# Patient Record
Sex: Female | Born: 1955 | Race: White | Hispanic: No | Marital: Married | Smoking: Never smoker
Health system: Southern US, Community
[De-identification: ages and names within clinical notes are randomized; demographics above are authoritative.]

## PROBLEM LIST (undated history)

## (undated) DIAGNOSIS — M549 Dorsalgia, unspecified: Secondary | ICD-10-CM

## (undated) DIAGNOSIS — F419 Anxiety disorder, unspecified: Secondary | ICD-10-CM

## (undated) DIAGNOSIS — R319 Hematuria, unspecified: Secondary | ICD-10-CM

## (undated) DIAGNOSIS — F329 Major depressive disorder, single episode, unspecified: Secondary | ICD-10-CM

## (undated) DIAGNOSIS — T7840XA Allergy, unspecified, initial encounter: Secondary | ICD-10-CM

## (undated) DIAGNOSIS — K5790 Diverticulosis of intestine, part unspecified, without perforation or abscess without bleeding: Secondary | ICD-10-CM

## (undated) DIAGNOSIS — M1990 Unspecified osteoarthritis, unspecified site: Secondary | ICD-10-CM

## (undated) DIAGNOSIS — D649 Anemia, unspecified: Secondary | ICD-10-CM

## (undated) DIAGNOSIS — Z86010 Personal history of colonic polyps: Secondary | ICD-10-CM

## (undated) DIAGNOSIS — E785 Hyperlipidemia, unspecified: Secondary | ICD-10-CM

## (undated) DIAGNOSIS — F32A Depression, unspecified: Secondary | ICD-10-CM

## (undated) DIAGNOSIS — R42 Dizziness and giddiness: Secondary | ICD-10-CM

## (undated) DIAGNOSIS — I10 Essential (primary) hypertension: Secondary | ICD-10-CM

## (undated) DIAGNOSIS — K219 Gastro-esophageal reflux disease without esophagitis: Secondary | ICD-10-CM

## (undated) DIAGNOSIS — E739 Lactose intolerance, unspecified: Secondary | ICD-10-CM

## (undated) DIAGNOSIS — M25839 Other specified joint disorders, unspecified wrist: Secondary | ICD-10-CM

## (undated) DIAGNOSIS — G709 Myoneural disorder, unspecified: Secondary | ICD-10-CM

## (undated) DIAGNOSIS — D3500 Benign neoplasm of unspecified adrenal gland: Secondary | ICD-10-CM

## (undated) DIAGNOSIS — G2581 Restless legs syndrome: Secondary | ICD-10-CM

## (undated) HISTORY — DX: Gastro-esophageal reflux disease without esophagitis: K21.9

## (undated) HISTORY — DX: Myoneural disorder, unspecified: G70.9

## (undated) HISTORY — DX: Dizziness and giddiness: R42

## (undated) HISTORY — DX: Dorsalgia, unspecified: M54.9

## (undated) HISTORY — DX: Anxiety disorder, unspecified: F41.9

## (undated) HISTORY — DX: Other specified joint disorders, unspecified wrist: M25.839

## (undated) HISTORY — DX: Restless legs syndrome: G25.81

## (undated) HISTORY — DX: Hyperlipidemia, unspecified: E78.5

## (undated) HISTORY — DX: Depression, unspecified: F32.A

## (undated) HISTORY — DX: Personal history of colonic polyps: Z86.010

## (undated) HISTORY — DX: Allergy, unspecified, initial encounter: T78.40XA

## (undated) HISTORY — DX: Diverticulosis of intestine, part unspecified, without perforation or abscess without bleeding: K57.90

## (undated) HISTORY — DX: Unspecified osteoarthritis, unspecified site: M19.90

## (undated) HISTORY — PX: OTHER SURGICAL HISTORY: SHX169

## (undated) HISTORY — DX: Benign neoplasm of unspecified adrenal gland: D35.00

## (undated) HISTORY — DX: Major depressive disorder, single episode, unspecified: F32.9

## (undated) HISTORY — DX: Lactose intolerance, unspecified: E73.9

## (undated) HISTORY — PX: NASAL SINUS SURGERY: SHX719

## (undated) HISTORY — DX: Essential (primary) hypertension: I10

## (undated) HISTORY — DX: Hematuria, unspecified: R31.9

## (undated) HISTORY — DX: Anemia, unspecified: D64.9

---

## 2000-01-03 ENCOUNTER — Other Ambulatory Visit: Admission: RE | Admit: 2000-01-03 | Discharge: 2000-01-03 | Payer: Self-pay | Admitting: Internal Medicine

## 2001-01-11 ENCOUNTER — Ambulatory Visit (HOSPITAL_COMMUNITY): Admission: RE | Admit: 2001-01-11 | Discharge: 2001-01-11 | Payer: Self-pay | Admitting: Internal Medicine

## 2003-02-10 ENCOUNTER — Encounter: Admission: RE | Admit: 2003-02-10 | Discharge: 2003-02-10 | Payer: Self-pay | Admitting: Family Medicine

## 2003-08-28 ENCOUNTER — Emergency Department (HOSPITAL_COMMUNITY): Admission: EM | Admit: 2003-08-28 | Discharge: 2003-08-28 | Payer: Self-pay | Admitting: Family Medicine

## 2004-03-22 ENCOUNTER — Ambulatory Visit (HOSPITAL_COMMUNITY): Admission: RE | Admit: 2004-03-22 | Discharge: 2004-03-22 | Payer: Self-pay | Admitting: *Deleted

## 2004-03-22 ENCOUNTER — Encounter (INDEPENDENT_AMBULATORY_CARE_PROVIDER_SITE_OTHER): Payer: Self-pay | Admitting: *Deleted

## 2004-06-01 ENCOUNTER — Ambulatory Visit: Payer: Self-pay | Admitting: Family Medicine

## 2004-10-18 IMAGING — CT CT HEAD WO/W CM
1 of 2 series · 13 of 30 positions shown, 17 images · IV contrast (100 ML OMNI 300)
Comparison: none

CLINICAL DATA: Headaches.  Hypertension.  Dizziness.  CON-NONE.
 HEAD CT PRE AND POST CONTRAST
 Cranial CT was performed before and after administration of 100 cc Omnipaque 300 intravenous contrast.
 There is no evidence of enhancing lesions, brain edema, mass effect or intracranial hemorrhage. The ventricles are normal. No extra-axial abnormalities are identified. Bone windows show no significant abnormality.
 IMPRESSION
 Negative cranial CT.

[Series 2: brain · axial · 0.49mm/px · z∈[+30,+154]mm · 13 of 28 slices shown, 17 images]
[im 2/28  brain]
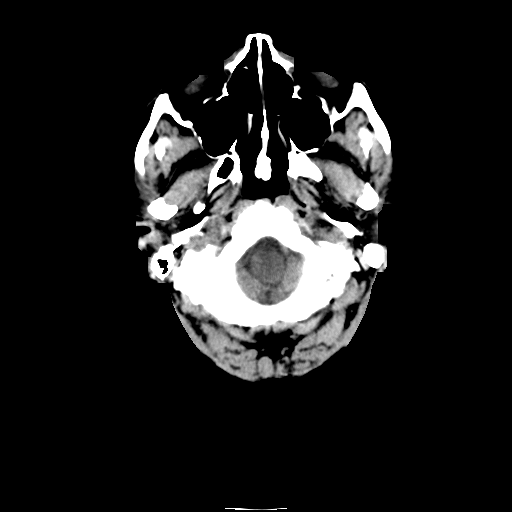
[im 2/28  bone]
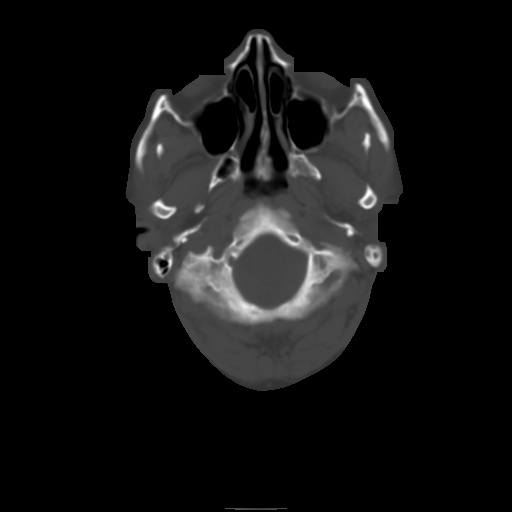
[im 4/28  brain]
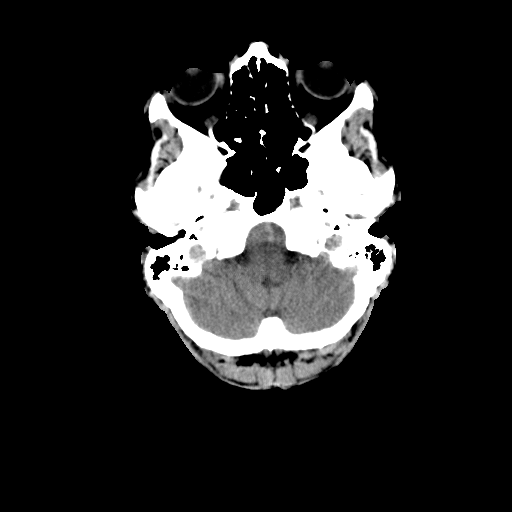
[im 6/28  brain]
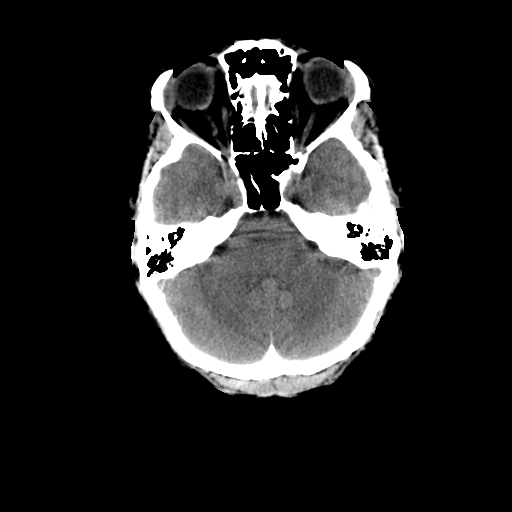
[im 8/28  brain]
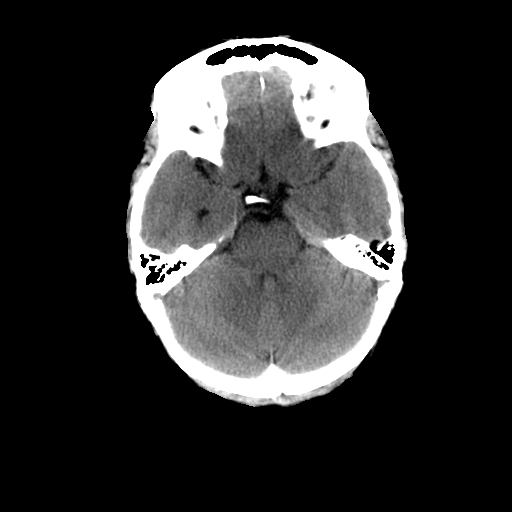
[im 10/28  brain]
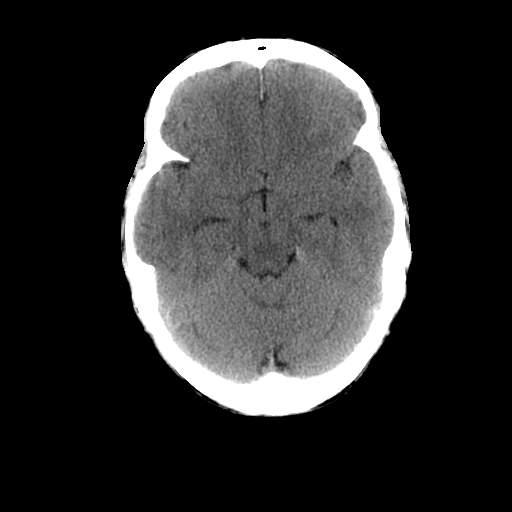
[im 10/28  bone]
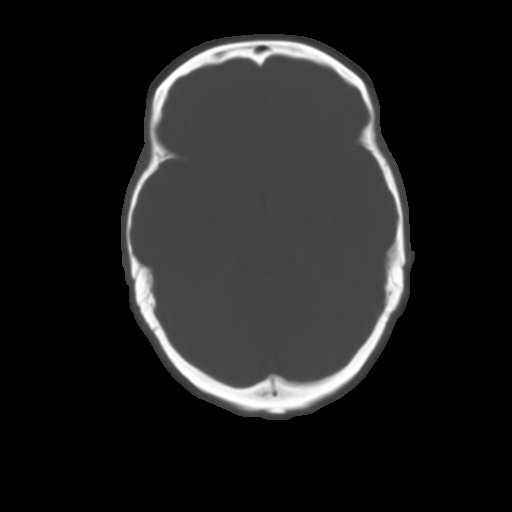
[im 12/28  brain]
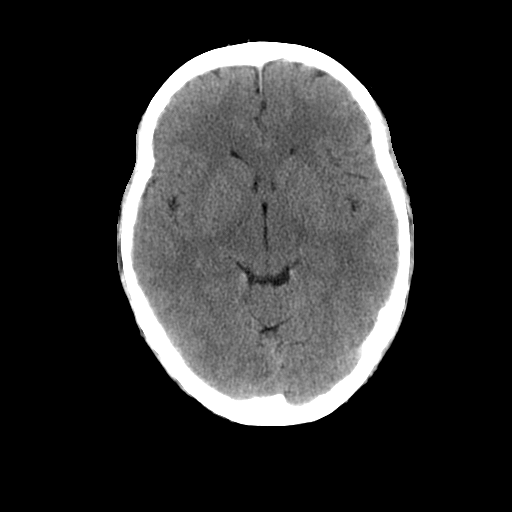
[im 14/28  brain]
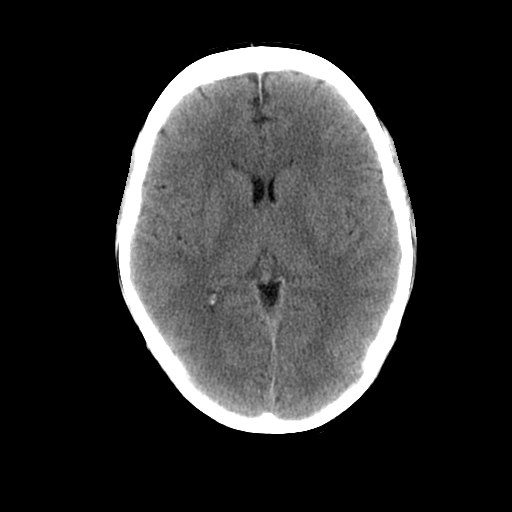
[im 16/28  brain]
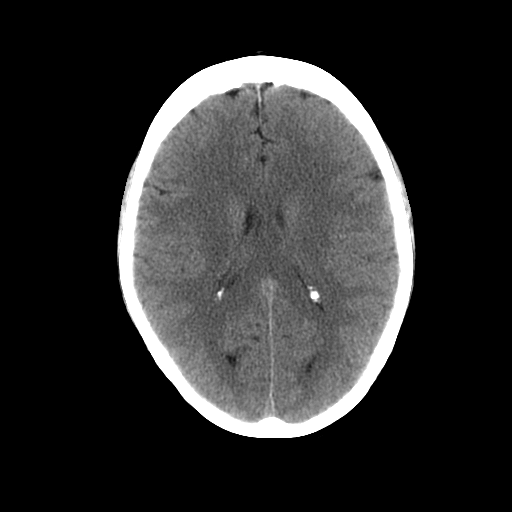
[im 18/28  brain]
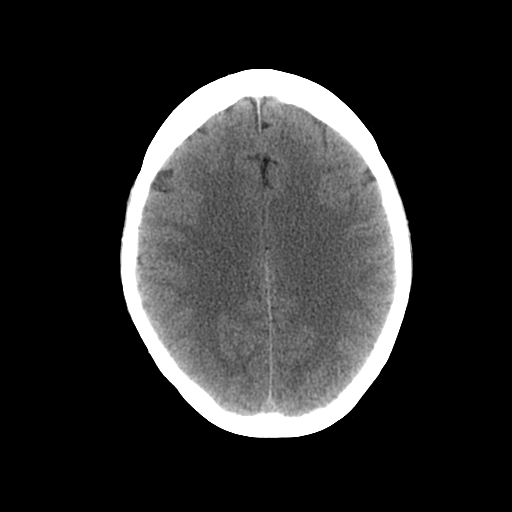
[im 18/28  bone]
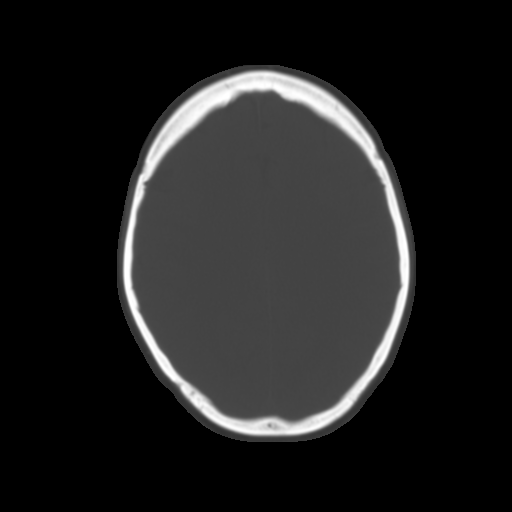
[im 20/28  brain]
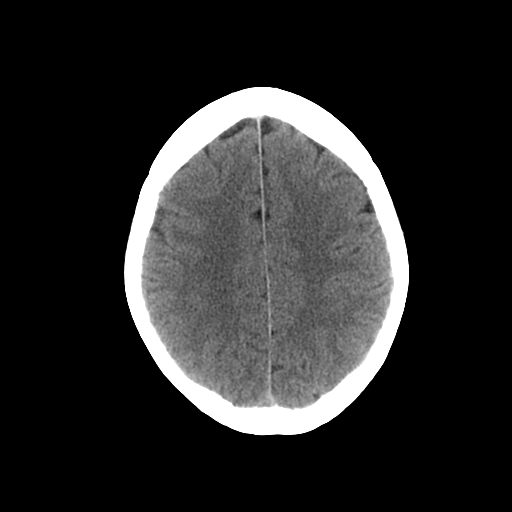
[im 22/28  brain]
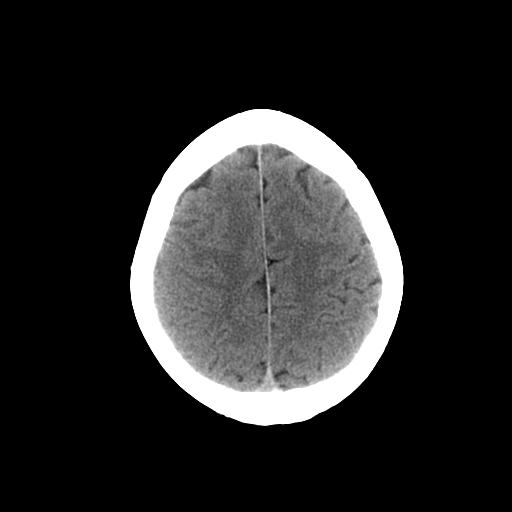
[im 24/28  brain]
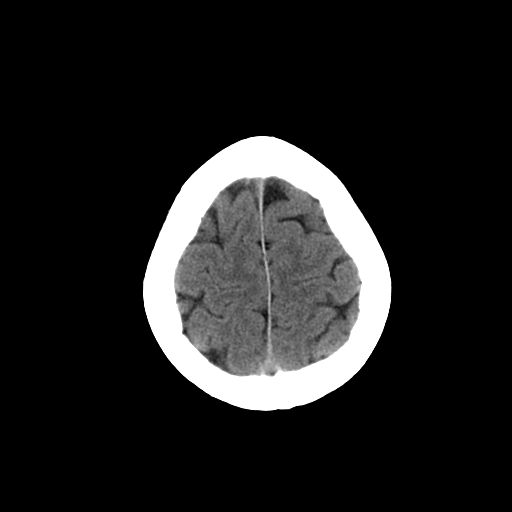
[im 26/28  brain]
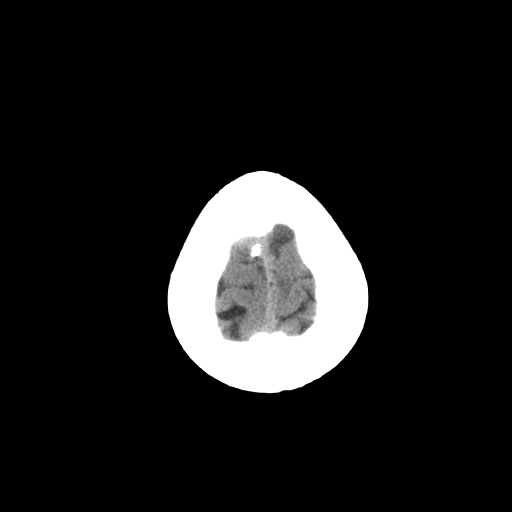
[im 26/28  bone]
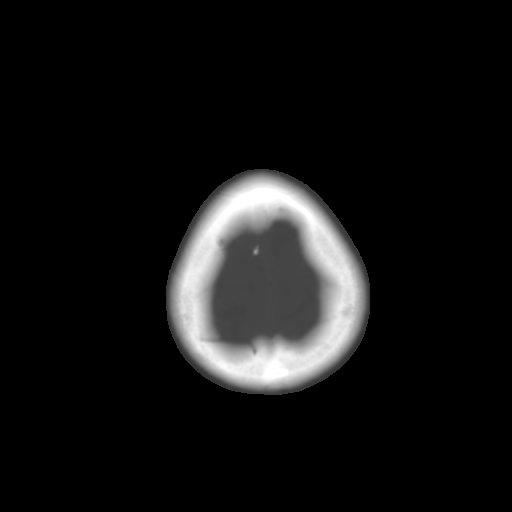

[13 of 30 positions shown; findings below may reference images not displayed]

## 2004-10-21 ENCOUNTER — Ambulatory Visit: Payer: Self-pay | Admitting: Family Medicine

## 2005-01-03 ENCOUNTER — Emergency Department (HOSPITAL_COMMUNITY): Admission: EM | Admit: 2005-01-03 | Discharge: 2005-01-03 | Payer: Self-pay | Admitting: Family Medicine

## 2005-01-04 ENCOUNTER — Ambulatory Visit: Payer: Self-pay | Admitting: Family Medicine

## 2005-02-13 ENCOUNTER — Ambulatory Visit: Payer: Self-pay | Admitting: Licensed Clinical Social Worker

## 2005-03-23 ENCOUNTER — Ambulatory Visit: Payer: Self-pay | Admitting: Family Medicine

## 2005-05-30 ENCOUNTER — Ambulatory Visit: Payer: Self-pay | Admitting: Family Medicine

## 2005-06-06 ENCOUNTER — Ambulatory Visit: Payer: Self-pay | Admitting: Family Medicine

## 2005-06-26 ENCOUNTER — Ambulatory Visit: Payer: Self-pay | Admitting: Family Medicine

## 2005-07-17 ENCOUNTER — Ambulatory Visit: Payer: Self-pay | Admitting: Family Medicine

## 2006-06-22 ENCOUNTER — Ambulatory Visit: Payer: Self-pay | Admitting: Family Medicine

## 2006-07-13 ENCOUNTER — Ambulatory Visit: Payer: Self-pay | Admitting: Family Medicine

## 2006-07-13 LAB — CONVERTED CEMR LAB
ALT: 17 units/L (ref 0–40)
AST: 16 units/L (ref 0–37)
Albumin: 3.6 g/dL (ref 3.5–5.2)
Alkaline Phosphatase: 48 units/L (ref 39–117)
BUN: 13 mg/dL (ref 6–23)
Basophils Absolute: 0 10*3/uL (ref 0.0–0.1)
Basophils Relative: 0.5 % (ref 0.0–1.0)
Bilirubin, Direct: 0.1 mg/dL (ref 0.0–0.3)
CO2: 32 meq/L (ref 19–32)
Calcium: 9.4 mg/dL (ref 8.4–10.5)
Chloride: 107 meq/L (ref 96–112)
Cholesterol: 177 mg/dL (ref 0–200)
Creatinine, Ser: 0.9 mg/dL (ref 0.4–1.2)
Eosinophils Absolute: 0.1 10*3/uL (ref 0.0–0.6)
Eosinophils Relative: 1.7 % (ref 0.0–5.0)
GFR calc Af Amer: 85 mL/min
GFR calc non Af Amer: 70 mL/min
Glucose, Bld: 115 mg/dL — ABNORMAL HIGH (ref 70–99)
HCT: 37 % (ref 36.0–46.0)
HDL: 40.6 mg/dL (ref >39.0)
Hemoglobin: 12.9 g/dL (ref 12.0–15.0)
LDL Cholesterol: 110 mg/dL — ABNORMAL HIGH (ref 0–99)
Lymphocytes Relative: 25.9 % (ref 12.0–46.0)
MCHC: 34.8 g/dL (ref 30.0–36.0)
MCV: 89.2 fL (ref 78.0–100.0)
Monocytes Absolute: 0.3 10*3/uL (ref 0.2–0.7)
Monocytes Relative: 6.4 % (ref 3.0–11.0)
Neutro Abs: 3.5 10*3/uL (ref 1.4–7.7)
Neutrophils Relative %: 65.5 % (ref 43.0–77.0)
Platelets: 288 10*3/uL (ref 150–400)
Potassium: 3.1 meq/L — ABNORMAL LOW (ref 3.5–5.1)
RBC: 4.14 M/uL (ref 3.87–5.11)
RDW: 13 % (ref 11.5–14.6)
Sodium: 145 meq/L (ref 135–145)
TSH: 2.36 microintl units/mL (ref 0.35–5.50)
Total Bilirubin: 0.6 mg/dL (ref 0.3–1.2)
Total CHOL/HDL Ratio: 4.4
Total Protein: 7.1 g/dL (ref 6.0–8.3)
Triglycerides: 134 mg/dL (ref 0–149)
VLDL: 27 mg/dL (ref 0–40)
WBC: 5.3 10*3/uL (ref 4.5–10.5)

## 2006-07-20 ENCOUNTER — Ambulatory Visit: Payer: Self-pay | Admitting: Family Medicine

## 2006-08-13 ENCOUNTER — Ambulatory Visit: Payer: Self-pay | Admitting: Family Medicine

## 2006-09-07 ENCOUNTER — Ambulatory Visit: Payer: Self-pay | Admitting: Family Medicine

## 2006-09-07 LAB — CONVERTED CEMR LAB
ALT: 15 units/L (ref 0–40)
AST: 17 units/L (ref 0–37)
Albumin: 4.2 g/dL (ref 3.5–5.2)
Alkaline Phosphatase: 56 units/L (ref 39–117)
Amylase: 37 units/L (ref 27–131)
BUN: 12 mg/dL (ref 6–23)
Basophils Absolute: 0 10*3/uL (ref 0.0–0.1)
Basophils Relative: 0.1 % (ref 0.0–1.0)
Bilirubin, Direct: 0.1 mg/dL (ref 0.0–0.3)
CO2: 34 meq/L — ABNORMAL HIGH (ref 19–32)
Calcium: 9.5 mg/dL (ref 8.4–10.5)
Chloride: 105 meq/L (ref 96–112)
Creatinine, Ser: 1 mg/dL (ref 0.4–1.2)
Eosinophils Absolute: 0.1 10*3/uL (ref 0.0–0.6)
Eosinophils Relative: 1.9 % (ref 0.0–5.0)
GFR calc Af Amer: 75 mL/min
GFR calc non Af Amer: 62 mL/min
Glucose, Bld: 113 mg/dL — ABNORMAL HIGH (ref 70–99)
HCT: 40.7 % (ref 36.0–46.0)
Hemoglobin: 13.7 g/dL (ref 12.0–15.0)
Lipase: 27 units/L (ref 11.0–59.0)
Lymphocytes Relative: 29.8 % (ref 12.0–46.0)
MCHC: 33.7 g/dL (ref 30.0–36.0)
MCV: 89 fL (ref 78.0–100.0)
Monocytes Absolute: 0.3 10*3/uL (ref 0.2–0.7)
Monocytes Relative: 5.9 % (ref 3.0–11.0)
Neutro Abs: 2.9 10*3/uL (ref 1.4–7.7)
Neutrophils Relative %: 62.3 % (ref 43.0–77.0)
Platelets: 342 10*3/uL (ref 150–400)
Potassium: 3.6 meq/L (ref 3.5–5.1)
RBC: 4.58 M/uL (ref 3.87–5.11)
RDW: 12.5 % (ref 11.5–14.6)
Sodium: 144 meq/L (ref 135–145)
Total Bilirubin: 0.7 mg/dL (ref 0.3–1.2)
Total Protein: 7.8 g/dL (ref 6.0–8.3)
WBC: 4.7 10*3/uL (ref 4.5–10.5)

## 2006-09-11 ENCOUNTER — Encounter: Admission: RE | Admit: 2006-09-11 | Discharge: 2006-09-11 | Payer: Self-pay | Admitting: Family Medicine

## 2006-11-16 ENCOUNTER — Telehealth: Payer: Self-pay | Admitting: Family Medicine

## 2006-11-16 DIAGNOSIS — M6740 Ganglion, unspecified site: Secondary | ICD-10-CM

## 2006-12-12 ENCOUNTER — Encounter: Payer: Self-pay | Admitting: Family Medicine

## 2007-01-10 ENCOUNTER — Telehealth: Payer: Self-pay | Admitting: Family Medicine

## 2007-01-21 ENCOUNTER — Ambulatory Visit: Payer: Self-pay | Admitting: Family Medicine

## 2007-01-21 DIAGNOSIS — I10 Essential (primary) hypertension: Secondary | ICD-10-CM | POA: Insufficient documentation

## 2007-01-21 DIAGNOSIS — J309 Allergic rhinitis, unspecified: Secondary | ICD-10-CM | POA: Insufficient documentation

## 2007-01-21 DIAGNOSIS — F411 Generalized anxiety disorder: Secondary | ICD-10-CM | POA: Insufficient documentation

## 2007-01-21 DIAGNOSIS — F329 Major depressive disorder, single episode, unspecified: Secondary | ICD-10-CM | POA: Insufficient documentation

## 2007-01-21 DIAGNOSIS — J209 Acute bronchitis, unspecified: Secondary | ICD-10-CM

## 2007-05-16 ENCOUNTER — Telehealth: Payer: Self-pay | Admitting: Family Medicine

## 2007-05-18 HISTORY — PX: COLONOSCOPY: SHX174

## 2007-08-06 ENCOUNTER — Encounter: Payer: Self-pay | Admitting: Family Medicine

## 2007-11-21 ENCOUNTER — Telehealth: Payer: Self-pay | Admitting: Family Medicine

## 2007-11-21 ENCOUNTER — Ambulatory Visit: Payer: Self-pay | Admitting: Family Medicine

## 2007-11-21 DIAGNOSIS — R42 Dizziness and giddiness: Secondary | ICD-10-CM | POA: Insufficient documentation

## 2007-11-26 ENCOUNTER — Ambulatory Visit: Payer: Self-pay | Admitting: Family Medicine

## 2007-11-26 LAB — CONVERTED CEMR LAB
Bilirubin Urine: NEGATIVE
Blood in Urine, dipstick: NEGATIVE
Glucose, Urine, Semiquant: NEGATIVE
Ketones, urine, test strip: NEGATIVE
Nitrite: NEGATIVE
Protein, U semiquant: NEGATIVE
Specific Gravity, Urine: 1.025
Urobilinogen, UA: 0.2
WBC Urine, dipstick: NEGATIVE
pH: 5.5

## 2007-11-28 LAB — CONVERTED CEMR LAB
ALT: 17 units/L (ref 0–35)
AST: 16 units/L (ref 0–37)
Albumin: 4 g/dL (ref 3.5–5.2)
Alkaline Phosphatase: 53 units/L (ref 39–117)
BUN: 12 mg/dL (ref 6–23)
Basophils Absolute: 0 10*3/uL (ref 0.0–0.1)
Basophils Relative: 0.5 % (ref 0.0–3.0)
Bilirubin, Direct: 0.1 mg/dL (ref 0.0–0.3)
CO2: 31 meq/L (ref 19–32)
Calcium: 9.3 mg/dL (ref 8.4–10.5)
Chloride: 103 meq/L (ref 96–112)
Cholesterol: 195 mg/dL (ref 0–200)
Creatinine, Ser: 0.9 mg/dL (ref 0.4–1.2)
Eosinophils Absolute: 0.1 10*3/uL (ref 0.0–0.7)
Eosinophils Relative: 2 % (ref 0.0–5.0)
GFR calc Af Amer: 85 mL/min
GFR calc non Af Amer: 70 mL/min
Glucose, Bld: 109 mg/dL — ABNORMAL HIGH (ref 70–99)
HCT: 38.7 % (ref 36.0–46.0)
HDL: 41.3 mg/dL (ref >39.0)
Hemoglobin: 13.4 g/dL (ref 12.0–15.0)
LDL Cholesterol: 134 mg/dL — ABNORMAL HIGH (ref 0–99)
Lymphocytes Relative: 30.1 % (ref 12.0–46.0)
MCHC: 34.6 g/dL (ref 30.0–36.0)
MCV: 91.6 fL (ref 78.0–100.0)
Monocytes Absolute: 0.3 10*3/uL (ref 0.1–1.0)
Monocytes Relative: 6.2 % (ref 3.0–12.0)
Neutro Abs: 3 10*3/uL (ref 1.4–7.7)
Neutrophils Relative %: 61.2 % (ref 43.0–77.0)
Platelets: 275 10*3/uL (ref 150–400)
Potassium: 3.8 meq/L (ref 3.5–5.1)
RBC: 4.22 M/uL (ref 3.87–5.11)
RDW: 12.5 % (ref 11.5–14.6)
Sodium: 141 meq/L (ref 135–145)
TSH: 2.29 microintl units/mL (ref 0.35–5.50)
Total Bilirubin: 0.7 mg/dL (ref 0.3–1.2)
Total CHOL/HDL Ratio: 4.7
Total Protein: 7.6 g/dL (ref 6.0–8.3)
Triglycerides: 99 mg/dL (ref 0–149)
VLDL: 20 mg/dL (ref 0–40)
WBC: 4.8 10*3/uL (ref 4.5–10.5)

## 2007-12-03 ENCOUNTER — Ambulatory Visit: Payer: Self-pay | Admitting: Family Medicine

## 2007-12-03 DIAGNOSIS — K219 Gastro-esophageal reflux disease without esophagitis: Secondary | ICD-10-CM | POA: Insufficient documentation

## 2007-12-03 DIAGNOSIS — E785 Hyperlipidemia, unspecified: Secondary | ICD-10-CM | POA: Insufficient documentation

## 2007-12-30 ENCOUNTER — Ambulatory Visit: Payer: Self-pay | Admitting: Gastroenterology

## 2008-01-15 ENCOUNTER — Ambulatory Visit: Payer: Self-pay | Admitting: Gastroenterology

## 2008-01-15 LAB — HM COLONOSCOPY

## 2008-01-16 ENCOUNTER — Telehealth: Payer: Self-pay | Admitting: Gastroenterology

## 2008-01-20 ENCOUNTER — Telehealth: Payer: Self-pay | Admitting: Gastroenterology

## 2008-02-11 ENCOUNTER — Ambulatory Visit: Payer: Self-pay | Admitting: Family Medicine

## 2008-02-11 DIAGNOSIS — R252 Cramp and spasm: Secondary | ICD-10-CM

## 2008-02-11 DIAGNOSIS — IMO0002 Reserved for concepts with insufficient information to code with codable children: Secondary | ICD-10-CM | POA: Insufficient documentation

## 2008-04-30 ENCOUNTER — Telehealth: Payer: Self-pay | Admitting: Family Medicine

## 2008-05-01 ENCOUNTER — Ambulatory Visit: Payer: Self-pay | Admitting: Family Medicine

## 2008-05-01 DIAGNOSIS — G2581 Restless legs syndrome: Secondary | ICD-10-CM

## 2008-05-19 IMAGING — US US ABDOMEN COMPLETE
1 series · 14 of 25 positions shown · non-contrast
Comparison: none

CLINICAL DATA: Abdominal pain with nausea.  
 ABDOMEN ULTRASOUND:
TECHNIQUE: Complete abdominal ultrasound examination was performed including evaluation of the liver, gallbladder, bile ducts, pancreas, kidneys, spleen, IVC, and abdominal aorta.
 Gallbladder and bile ducts normal.  The common duct is 3.7 mm.  There are no focal lesions of the liver.  Subjectively, the echogenicity of the liver is slightly increased.  This is most likely due to fatty infiltration.  Hepatocellular disease cannot be excluded.  This needs correlation with liver function studies.  Spleen, pancreas, aorta, and IVC are normal.  Kidney size normal.  There is a 1.8 cm simple cyst in the lower pole of the right kidney.  No ascites. 
 Pancreas is normal.

[Series 1: us abdomen complete · 0.32mm/px · 14 of 87 slices shown]
[im 1/87]
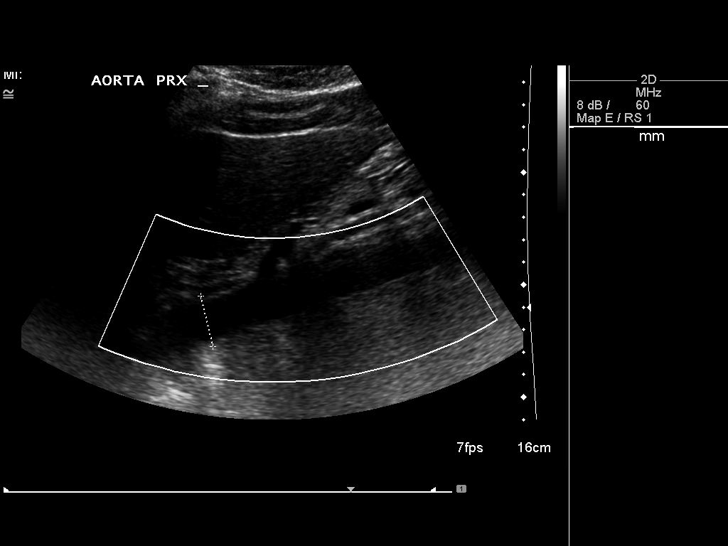
[im 8/87]
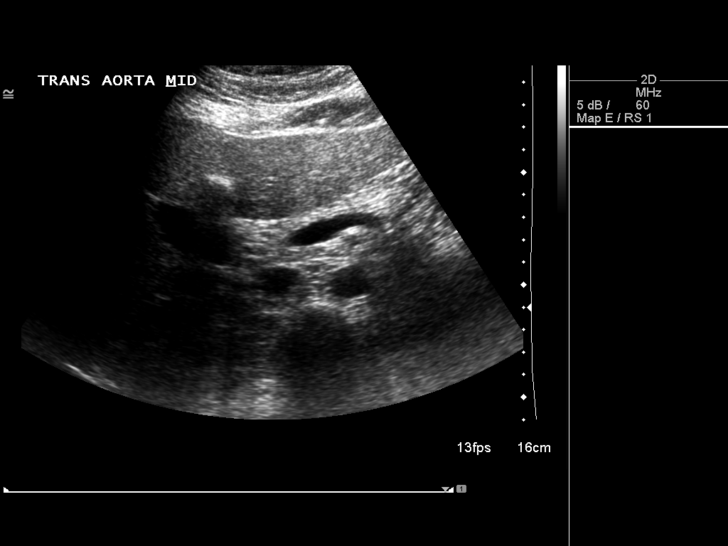
[im 15/87]
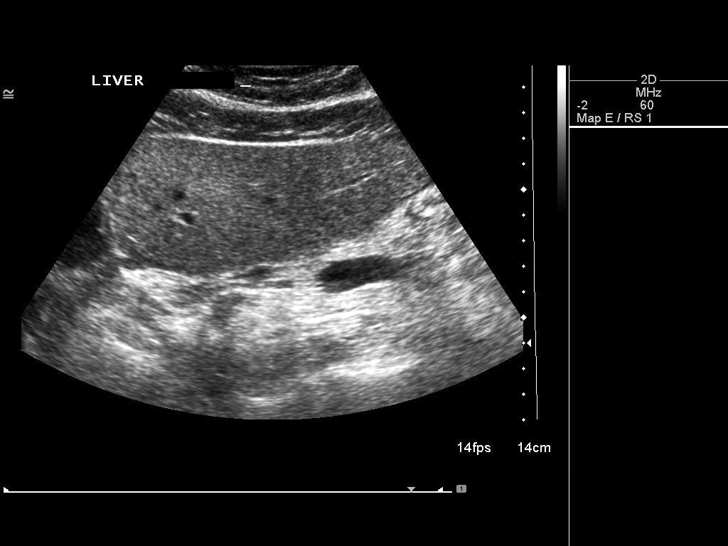
[im 22/87]
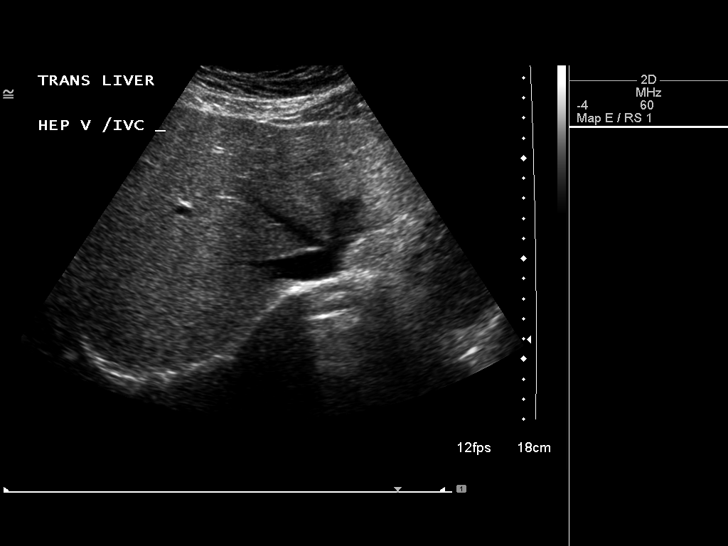
[im 29/87]
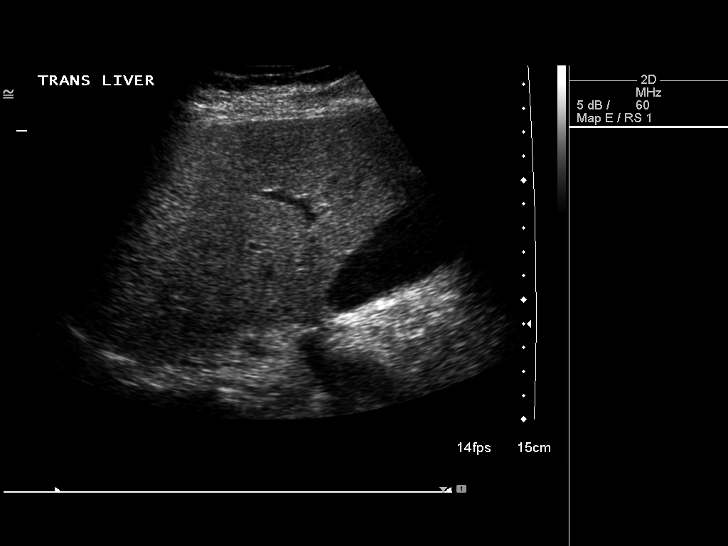
[im 33/87]
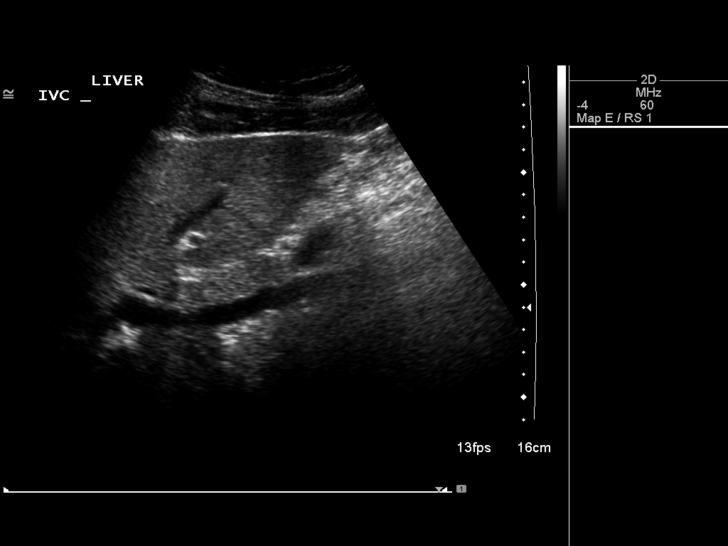
[im 40/87]
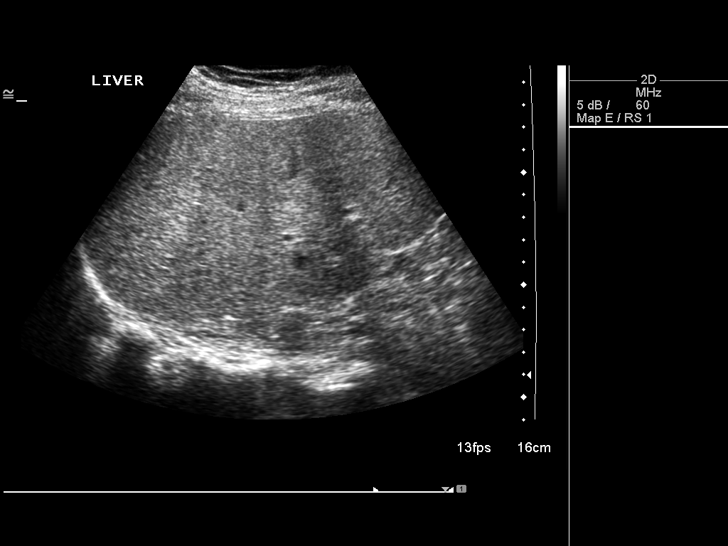
[im 47/87]
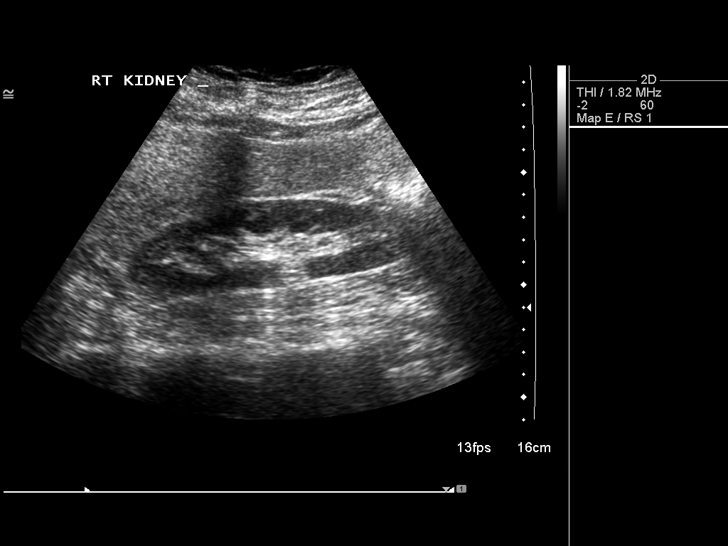
[im 54/87]
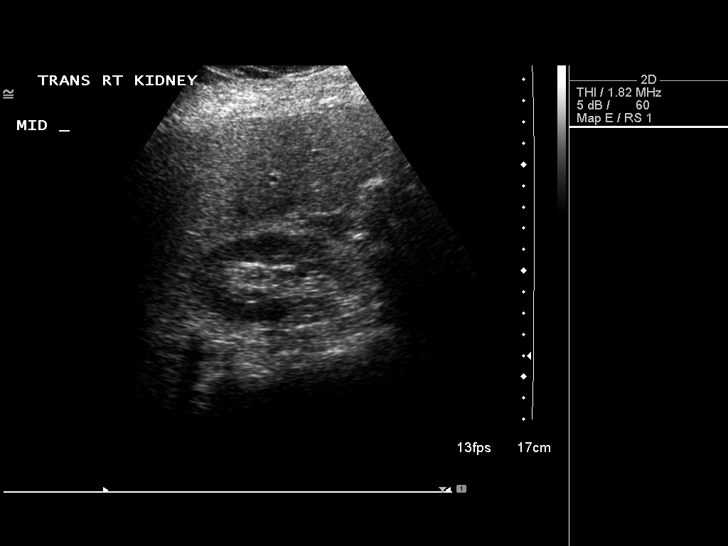
[im 58/87]
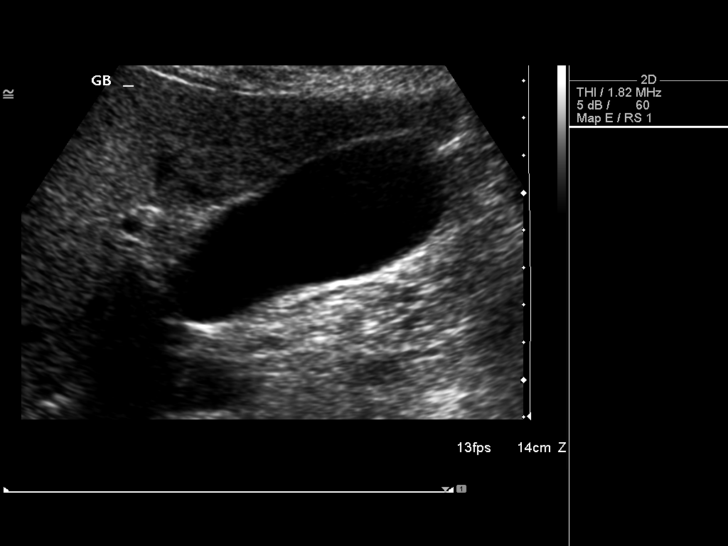
[im 65/87]
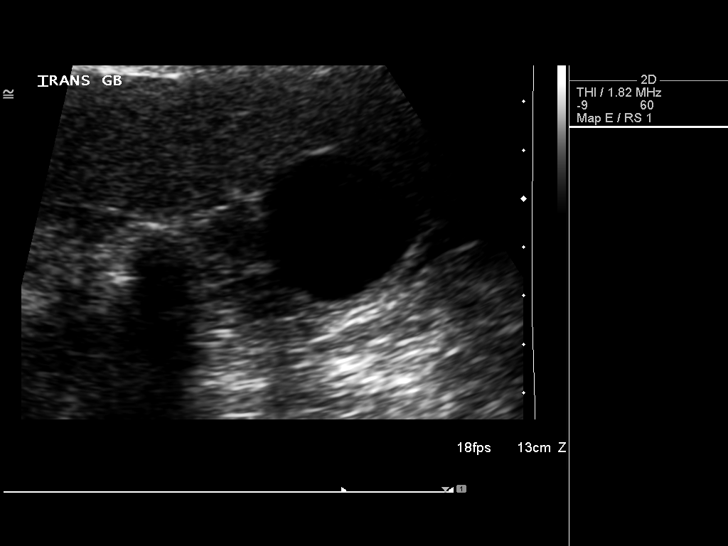
[im 72/87]
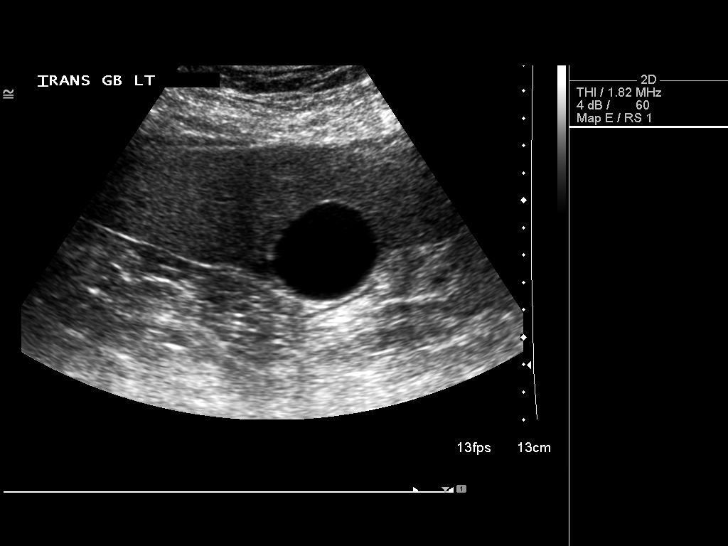
[im 79/87]
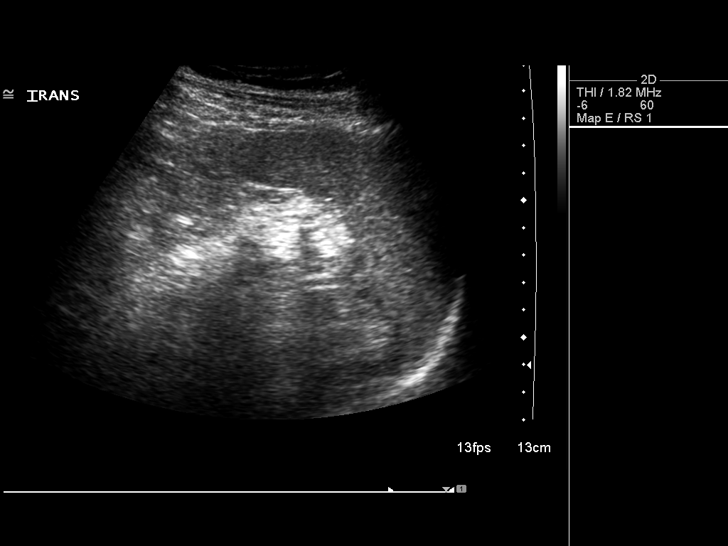
[im 87/87]
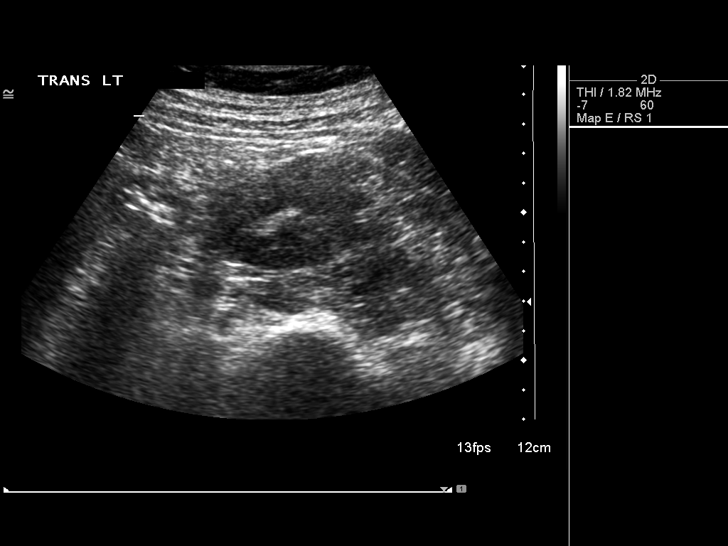

[14 of 25 positions shown; findings below may reference images not displayed]

IMPRESSION: 1.  Normal gallbladder and bile ducts.
 2.  Slight increased echogenicity of the liver.
 3.  Simple cyst of the right kidney.

## 2008-05-25 ENCOUNTER — Encounter: Payer: Self-pay | Admitting: Family Medicine

## 2008-06-01 ENCOUNTER — Telehealth: Payer: Self-pay | Admitting: Family Medicine

## 2008-06-05 ENCOUNTER — Ambulatory Visit (HOSPITAL_BASED_OUTPATIENT_CLINIC_OR_DEPARTMENT_OTHER): Admission: RE | Admit: 2008-06-05 | Discharge: 2008-06-05 | Payer: Self-pay | Admitting: Orthopedic Surgery

## 2008-07-13 ENCOUNTER — Encounter: Payer: Self-pay | Admitting: Family Medicine

## 2008-08-06 ENCOUNTER — Encounter: Payer: Self-pay | Admitting: Family Medicine

## 2008-09-03 ENCOUNTER — Telehealth: Payer: Self-pay | Admitting: Family Medicine

## 2008-09-08 LAB — HM MAMMOGRAPHY

## 2008-11-24 ENCOUNTER — Ambulatory Visit: Payer: Self-pay | Admitting: Family Medicine

## 2008-11-24 DIAGNOSIS — E860 Dehydration: Secondary | ICD-10-CM | POA: Insufficient documentation

## 2008-11-24 LAB — CONVERTED CEMR LAB
Bilirubin Urine: NEGATIVE
Blood in Urine, dipstick: NEGATIVE
Glucose, Urine, Semiquant: NEGATIVE
Ketones, urine, test strip: NEGATIVE
Nitrite: NEGATIVE
Protein, U semiquant: NEGATIVE
Specific Gravity, Urine: >=1.03
Urobilinogen, UA: 0.2
WBC Urine, dipstick: NEGATIVE
pH: 5.5

## 2009-05-13 ENCOUNTER — Telehealth: Payer: Self-pay | Admitting: Family Medicine

## 2009-05-26 ENCOUNTER — Encounter: Payer: Self-pay | Admitting: Family Medicine

## 2009-06-02 ENCOUNTER — Ambulatory Visit: Payer: Self-pay | Admitting: Family Medicine

## 2009-06-02 LAB — CONVERTED CEMR LAB
Bilirubin Urine: NEGATIVE
Glucose, Urine, Semiquant: NEGATIVE
Ketones, urine, test strip: NEGATIVE
Nitrite: NEGATIVE
Protein, U semiquant: NEGATIVE
Specific Gravity, Urine: 1.02
Urobilinogen, UA: 0.2

## 2009-06-03 ENCOUNTER — Telehealth (INDEPENDENT_AMBULATORY_CARE_PROVIDER_SITE_OTHER): Payer: Self-pay | Admitting: *Deleted

## 2009-06-03 LAB — CONVERTED CEMR LAB
ALT: 31 units/L (ref 0–35)
AST: 28 units/L (ref 0–37)
Albumin: 4.1 g/dL (ref 3.5–5.2)
Alkaline Phosphatase: 61 units/L (ref 39–117)
BUN: 15 mg/dL (ref 6–23)
Basophils Absolute: 0 10*3/uL (ref 0.0–0.1)
Basophils Relative: 0.9 % (ref 0.0–3.0)
Bilirubin, Direct: 0.1 mg/dL (ref 0.0–0.3)
CO2: 31 meq/L (ref 19–32)
Calcium: 10 mg/dL (ref 8.4–10.5)
Chloride: 104 meq/L (ref 96–112)
Cholesterol: 208 mg/dL — ABNORMAL HIGH (ref 0–200)
Creatinine, Ser: 0.9 mg/dL (ref 0.4–1.2)
Direct LDL: 157.1 mg/dL
Eosinophils Absolute: 0.1 10*3/uL (ref 0.0–0.7)
Eosinophils Relative: 2.1 % (ref 0.0–5.0)
GFR calc non Af Amer: 69.63 mL/min (ref >60)
Glucose, Bld: 117 mg/dL — ABNORMAL HIGH (ref 70–99)
HCT: 38.8 % (ref 36.0–46.0)
HDL: 50.5 mg/dL (ref >39.00)
Hemoglobin: 13.1 g/dL (ref 12.0–15.0)
Lymphocytes Relative: 27.6 % (ref 12.0–46.0)
Lymphs Abs: 1.2 10*3/uL (ref 0.7–4.0)
MCHC: 33.7 g/dL (ref 30.0–36.0)
MCV: 90.4 fL (ref 78.0–100.0)
Monocytes Absolute: 0.3 10*3/uL (ref 0.1–1.0)
Monocytes Relative: 6.9 % (ref 3.0–12.0)
Neutro Abs: 2.7 10*3/uL (ref 1.4–7.7)
Neutrophils Relative %: 62.5 % (ref 43.0–77.0)
Platelets: 242 10*3/uL (ref 150.0–400.0)
Potassium: 3.8 meq/L (ref 3.5–5.1)
RBC: 4.29 M/uL (ref 3.87–5.11)
RDW: 13 % (ref 11.5–14.6)
Sodium: 143 meq/L (ref 135–145)
TSH: 3.09 microintl units/mL (ref 0.35–5.50)
Total Bilirubin: 0.5 mg/dL (ref 0.3–1.2)
Total CHOL/HDL Ratio: 4
Total Protein: 7.4 g/dL (ref 6.0–8.3)
Triglycerides: 120 mg/dL (ref 0.0–149.0)
VLDL: 24 mg/dL (ref 0.0–40.0)
WBC: 4.3 10*3/uL — ABNORMAL LOW (ref 4.5–10.5)

## 2009-06-07 ENCOUNTER — Ambulatory Visit: Payer: Self-pay | Admitting: Family Medicine

## 2009-06-15 ENCOUNTER — Telehealth: Payer: Self-pay | Admitting: Family Medicine

## 2009-06-17 ENCOUNTER — Telehealth: Payer: Self-pay | Admitting: Family Medicine

## 2009-06-29 ENCOUNTER — Telehealth: Payer: Self-pay | Admitting: Family Medicine

## 2009-06-30 ENCOUNTER — Ambulatory Visit: Payer: Self-pay | Admitting: Family Medicine

## 2009-07-01 ENCOUNTER — Telehealth: Payer: Self-pay | Admitting: Family Medicine

## 2009-07-02 LAB — CONVERTED CEMR LAB
Bilirubin Urine: NEGATIVE
Blood in Urine, dipstick: NEGATIVE
Glucose, Urine, Semiquant: NEGATIVE
Ketones, urine, test strip: NEGATIVE
Nitrite: NEGATIVE
Protein, U semiquant: NEGATIVE
Specific Gravity, Urine: 1.01
Urobilinogen, UA: 0.2
pH: 5.5

## 2009-07-13 ENCOUNTER — Telehealth: Payer: Self-pay | Admitting: Family Medicine

## 2009-07-14 ENCOUNTER — Ambulatory Visit: Payer: Self-pay | Admitting: Family Medicine

## 2009-07-16 ENCOUNTER — Telehealth: Payer: Self-pay | Admitting: Family Medicine

## 2009-07-16 DIAGNOSIS — R319 Hematuria, unspecified: Secondary | ICD-10-CM

## 2009-07-16 LAB — CONVERTED CEMR LAB
Bilirubin Urine: NEGATIVE
Glucose, Urine, Semiquant: NEGATIVE
Ketones, urine, test strip: NEGATIVE
Nitrite: NEGATIVE
Specific Gravity, Urine: 1.025
Urobilinogen, UA: 0.2
WBC Urine, dipstick: NEGATIVE
pH: 5.5

## 2009-07-21 ENCOUNTER — Ambulatory Visit: Payer: Self-pay | Admitting: Family Medicine

## 2009-07-21 DIAGNOSIS — M545 Low back pain: Secondary | ICD-10-CM

## 2009-08-10 ENCOUNTER — Encounter: Payer: Self-pay | Admitting: Family Medicine

## 2009-08-12 ENCOUNTER — Telehealth (INDEPENDENT_AMBULATORY_CARE_PROVIDER_SITE_OTHER): Payer: Self-pay | Admitting: *Deleted

## 2009-08-17 ENCOUNTER — Encounter: Payer: Self-pay | Admitting: Family Medicine

## 2009-08-31 ENCOUNTER — Ambulatory Visit: Payer: Self-pay | Admitting: Family Medicine

## 2009-08-31 DIAGNOSIS — J019 Acute sinusitis, unspecified: Secondary | ICD-10-CM

## 2009-09-02 ENCOUNTER — Telehealth: Payer: Self-pay | Admitting: Family Medicine

## 2009-09-03 ENCOUNTER — Ambulatory Visit: Payer: Self-pay | Admitting: Family Medicine

## 2009-09-07 ENCOUNTER — Telehealth (INDEPENDENT_AMBULATORY_CARE_PROVIDER_SITE_OTHER): Payer: Self-pay | Admitting: *Deleted

## 2009-09-15 ENCOUNTER — Telehealth: Payer: Self-pay | Admitting: Family Medicine

## 2009-10-06 ENCOUNTER — Encounter: Payer: Self-pay | Admitting: Family Medicine

## 2009-12-20 ENCOUNTER — Ambulatory Visit: Payer: Self-pay | Admitting: Family Medicine

## 2009-12-20 DIAGNOSIS — J069 Acute upper respiratory infection, unspecified: Secondary | ICD-10-CM

## 2009-12-20 DIAGNOSIS — D3500 Benign neoplasm of unspecified adrenal gland: Secondary | ICD-10-CM | POA: Insufficient documentation

## 2009-12-21 ENCOUNTER — Telehealth: Payer: Self-pay | Admitting: Family Medicine

## 2009-12-22 ENCOUNTER — Telehealth: Payer: Self-pay | Admitting: Family Medicine

## 2009-12-23 ENCOUNTER — Telehealth: Payer: Self-pay | Admitting: Family Medicine

## 2009-12-27 ENCOUNTER — Telehealth: Payer: Self-pay | Admitting: Family Medicine

## 2010-01-19 ENCOUNTER — Telehealth: Payer: Self-pay | Admitting: Family Medicine

## 2010-02-11 ENCOUNTER — Telehealth: Payer: Self-pay | Admitting: Family Medicine

## 2010-02-16 ENCOUNTER — Ambulatory Visit: Payer: Self-pay | Admitting: Family Medicine

## 2010-02-22 ENCOUNTER — Telehealth: Payer: Self-pay | Admitting: Family Medicine

## 2010-03-15 ENCOUNTER — Telehealth: Payer: Self-pay | Admitting: Family Medicine

## 2010-03-28 ENCOUNTER — Ambulatory Visit: Payer: Self-pay | Admitting: Family Medicine

## 2010-03-31 ENCOUNTER — Telehealth: Payer: Self-pay | Admitting: *Deleted

## 2010-05-10 NOTE — Letter (Signed)
Summary: [facility]  [facility]   Imported By: Maryln Gottron 06/03/2009 11:01:09  _____________________________________________________________________  External Attachment:    Type:   Image     Comment:   External Document

## 2010-05-10 NOTE — Progress Notes (Signed)
Summary: depressed  Phone Note Call from Patient   Caller: Patient Call For: Nelwyn Salisbury MD Action Taken: Provider Notified Details for Reason: asking for antidepressant Details of Complaint: depression Summary of Call: Pt is calling to asklng for an antidepressant.......she has felt depressed x one month, and is not sure why. [phone]   Karin Golden ([place]) Initial call taken by: Lynann Beaver CMA,  Sep 02, 2009 3:44 PM  Follow-up for Phone Call        she needs an OV to discuss this Follow-up by: Nelwyn Salisbury MD,  Sep 03, 2009 8:24 AM  Additional Follow-up for Phone Call Additional follow up Details #1::        pATIENT SAYS OKAY & SHE'LL CALL BACK TO SCHEDULE. Additional Follow-up by: Rudy Jew, RN,  Sep 03, 2009 9:18 AM

## 2010-05-10 NOTE — Assessment & Plan Note (Signed)
Summary: discuss BP issues/dm   Vital Signs:  Patient profile:   55 year old female Weight:      196 pounds O2 Sat:      95 % Temp:     98.3 degrees F Pulse rate:   78 / minute BP sitting:   124 / 82  (left arm)  Vitals Entered By: Pura Spice, RN (December 20, 2009 11:41 AM) CC: discuss CT and states on prednisone for ear pressure and sinus headache   History of Present Illness: Here asking some questions about several topics. First, as part of a workup for hematuria, Dr. Margreta Journey ordered an abdominal CT in June. This showed a benign right renal cyst and an enlarged right adrenal gland which is probably an adenoma. Sherry Chapman is concerned that this could be hyperfunctional and could be secreting too much cortisol or other hormones. She is interested in measuring these levels in her system. Second, she wants me to check her ears. Over the past week she has had HAs, sinus pressure, ear pains, and a ST. She went to Urgent Care over the weekend and was told she had allergies. She was started on a steroid dose pack, and in fact she feels better. No fever.   Allergies: 1)  Biaxin (Clarithromycin)  Past History:  Past Medical History: Reviewed history from 06/07/2009 and no changes required. Allergic rhinitis Hypertension Anxiety Depression sees Dr. Ilda Mori for gyn exams GERD Hyperlipidemia hyperglycemia vertigo migraines  Past Surgical History: Reviewed history from 06/07/2009 and no changes required. Sinus surgery Deviated septum repair excision of dorsal ganglion from right wrist 06-05-08 per Dr. Molly Maduro Sypher colonoscopy 05-18-07 per Dr. Melvia Heaps, diverticulosis only, repeat in 10 yrs  Review of Systems  The patient denies anorexia, fever, weight loss, weight gain, vision loss, decreased hearing, hoarseness, chest pain, syncope, dyspnea on exertion, peripheral edema, prolonged cough, headaches, hemoptysis, abdominal pain, melena, hematochezia, severe  indigestion/heartburn, hematuria, incontinence, genital sores, muscle weakness, suspicious skin lesions, transient blindness, difficulty walking, depression, unusual weight change, abnormal bleeding, enlarged lymph nodes, angioedema, breast masses, and testicular masses.    Physical Exam  General:  Well-developed,well-nourished,in no acute distress; alert,appropriate and cooperative throughout examination Head:  Normocephalic and atraumatic without obvious abnormalities. No apparent alopecia or balding. Eyes:  No corneal or conjunctival inflammation noted. EOMI. Perrla. Funduscopic exam benign, without hemorrhages, exudates or papilledema. Vision grossly normal. Ears:  External ear exam shows no significant lesions or deformities.  Otoscopic examination reveals clear canals, tympanic membranes are intact bilaterally without bulging, retraction, inflammation or discharge. Hearing is grossly normal bilaterally. Nose:  External nasal examination shows no deformity or inflammation. Nasal mucosa are pink and moist without lesions or exudates. Mouth:  Oral mucosa and oropharynx without lesions or exudates.  Teeth in good repair. Neck:  No deformities, masses, or tenderness noted. Lungs:  Normal respiratory effort, chest expands symmetrically. Lungs are clear to auscultation, no crackles or wheezes.   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 Two times a day as needed anxiety 2)  Inderal La 80 Mg Cp24 (Propranolol hcl) .... Take 1 capsule by mouth once a day 3)  Norvasc 5 Mg Tabs (Amlodipine besylate) .... Take 1 tablet by mouth once a day 4)  Potassium Chloride Cr 10 Meq Tbcr (Potassium chloride) .Marland Kitchen.. 1 by mouth once daily 5)  Hydrochlorothiazide 25 Mg Tabs (Hydrochlorothiazide) .... Once daily 6)  Simvastatin 40 Mg Tabs (Simvastatin) .... Take  one tablet at bedtime  Patient Instructions: 1)  She will finish out the steroid taper. I reassured her that the adrenal  lesion is probably benign and that these usually are not hyperfunctional. She still wishes to check some levels, but we need to wait until her current steroid taper is finished so her system can return to baseline.

## 2010-05-10 NOTE — Progress Notes (Signed)
Summary: probs with prednisone  Phone Note Call from Patient Call back at Work Phone [phone]   Caller: Patient Call For: Nelwyn Salisbury MD Summary of Call: Pt has taken 16 Prednisone 10 mg., and is too nervous, can't sleep and is nauseated.  Wants to taper ASAP. Initial call taken by: Lynann Beaver CMA,  December 21, 2009 8:50 AM  Follow-up for Phone Call        finish out the taper but reduce every dosage to half what the insert says Follow-up by: Nelwyn Salisbury MD,  December 21, 2009 10:19 AM  Additional Follow-up for Phone Call Additional follow up Details #1::        Notified pt. Additional Follow-up by: Lynann Beaver CMA,  December 21, 2009 10:28 AM

## 2010-05-10 NOTE — Progress Notes (Signed)
Summary: headache  Phone Note Call from Patient Call back at Work Phone [phone]   Summary of Call: Pt is complaining of a headache when she bends over ............BP 145/82.  Not sure if it could be sinus related or BP related?  Wants to be seen or have RX called in for sinus if Dr. Clent Ridges feels her BP is ok??? Initial call taken by: [facility] CMA AAMA,  March 15, 2010 1:08 PM  Follow-up for Phone Call        the HA would not be from a BP that is only mildly elevated like this, so it must be from the sinuses. Try Mucinex 1200 mg two times a day for a few days. If not better by next week, see me  Follow-up by: Nelwyn Salisbury MD,  March 15, 2010 1:14 PM  Additional Follow-up for Phone Call Additional follow up Details #1::        Pt. notified. Additional Follow-up by: Lynann Beaver CMA AAMA,  March 15, 2010 2:35 PM

## 2010-05-10 NOTE — Assessment & Plan Note (Signed)
Summary: cpx/cjr/pt rsc from bmp/cjr   Vital Signs:  Patient profile:   55 year old female Height:      65 inches Weight:      198 pounds Temp:     97.8 degrees F oral Pulse rate:   76 / minute BP sitting:   122 / 78  (left arm) Cuff size:   large  Vitals Entered By: Alfred Levins, CMA (June 07, 2009 9:07 AM) CC: cpx, no pap   History of Present Illness: 55 yr old female for cpx. She feels well in general, but she is concerned about her inability to lose weight. It sounds like she eats a healthy diet but she does not get much exercise.   Current Medications (verified): 1)  Clonazepam 1 Mg Tabs (Clonazepam) .... Two Times A Day As Needed Anxiety 2)  Inderal La 80 Mg Cp24 (Propranolol Hcl) .... Take 1 Capsule By Mouth Once A Day 3)  Norvasc 5 Mg Tabs (Amlodipine Besylate) .... Take 1 Tablet By Mouth Once A Day 4)  Potassium Chloride Cr 10 Meq  Tbcr (Potassium Chloride) .Marland Kitchen.. 1 By Mouth Once Daily 5)  Meclizine Hcl 25 Mg  Tabs (Meclizine Hcl) .Marland Kitchen.. 1 Every 4 Hours As Needed Dizziness 6)  Hydrochlorothiazide 25 Mg Tabs (Hydrochlorothiazide) .... Once Daily 7)  Simvastatin 40 Mg Tabs (Simvastatin) .... Take One Tablet At Bedtime 8)  Macrobid 100 Mg Caps (Nitrofurantoin Monohyd Macro) .Marland Kitchen.. 1 By Mouth Two Times A Day  Allergies (verified): 1)  Biaxin (Clarithromycin)  Past History:  Past Medical History: Allergic rhinitis Hypertension Anxiety Depression sees Dr. Ilda Mori for gyn exams GERD Hyperlipidemia hyperglycemia vertigo migraines  Past Surgical History: Sinus surgery Deviated septum repair excision of dorsal ganglion from right wrist 06-05-08 per Dr. Josephine Igo colonoscopy 05-18-07 per Dr. Melvia Heaps, diverticulosis only, repeat in 10 yrs  Family History: Reviewed history from 12/03/2007 and no changes required. Family History of Alcoholism/Addiction Family History of CAD Female 1st degree relative <50 Family History Diabetes 1st degree  relative Family History Hypertension Family History of Stroke M 1st degree relative <50  Social History: Reviewed history from 12/03/2007 and no changes required. Married Never Smoked Alcohol use-no Drug use-no  Review of Systems  The patient denies anorexia, fever, weight loss, vision loss, decreased hearing, hoarseness, chest pain, syncope, dyspnea on exertion, peripheral edema, prolonged cough, headaches, hemoptysis, abdominal pain, melena, hematochezia, severe indigestion/heartburn, hematuria, incontinence, genital sores, muscle weakness, suspicious skin lesions, transient blindness, difficulty walking, depression, unusual weight change, abnormal bleeding, enlarged lymph nodes, angioedema, breast masses, and testicular masses.    Physical Exam  General:  overweight-appearing.   Head:  Normocephalic and atraumatic without obvious abnormalities. No apparent alopecia or balding. Eyes:  No corneal or conjunctival inflammation noted. EOMI. Perrla. Funduscopic exam benign, without hemorrhages, exudates or papilledema. Vision grossly normal. Ears:  External ear exam shows no significant lesions or deformities.  Otoscopic examination reveals clear canals, tympanic membranes are intact bilaterally without bulging, retraction, inflammation or discharge. Hearing is grossly normal bilaterally. Nose:  External nasal examination shows no deformity or inflammation. Nasal mucosa are pink and moist without lesions or exudates. Mouth:  Oral mucosa and oropharynx without lesions or exudates.  Teeth in good repair. Neck:  No deformities, masses, or tenderness noted. Chest Wall:  No deformities, masses, or tenderness noted. Lungs:  Normal respiratory effort, chest expands symmetrically. Lungs are clear to auscultation, no crackles or wheezes. Heart:  Normal rate and regular rhythm. S1 and S2 normal  without gallop, murmur, click, rub or other extra sounds. EKG normal Abdomen:  Bowel sounds positive,abdomen  soft and non-tender without masses, organomegaly or hernias noted. Msk:  No deformity or scoliosis noted of thoracic or lumbar spine.   Pulses:  R and L carotid,radial,femoral,dorsalis pedis and posterior tibial pulses are full and equal bilaterally Extremities:  No clubbing, cyanosis, edema, or deformity noted with normal full range of motion of all joints.   Neurologic:  No cranial nerve deficits noted. Station and gait are normal. Plantar reflexes are down-going bilaterally. DTRs are symmetrical throughout. Sensory, motor and coordinative functions appear intact. Skin:  Intact without suspicious lesions or rashes Cervical Nodes:  No lymphadenopathy noted Axillary Nodes:  No palpable lymphadenopathy Inguinal Nodes:  No significant adenopathy Psych:  Cognition and judgment appear intact. Alert and cooperative with normal attention span and concentration. No apparent delusions, illusions, hallucinations   Impression & Recommendations:  Problem # 1:  WELL ADULT EXAM (ICD-V70.0)  Orders: EKG w/ Interpretation (93000)  Complete Medication List: 1)  Clonazepam 1 Mg Tabs (Clonazepam) .... Two times a day as needed anxiety 2)  Inderal La 80 Mg Cp24 (Propranolol hcl) .... Take 1 capsule by mouth once a day 3)  Norvasc 5 Mg Tabs (Amlodipine besylate) .... Take 1 tablet by mouth once a day 4)  Potassium Chloride Cr 10 Meq Tbcr (Potassium chloride) .Marland Kitchen.. 1 by mouth once daily 5)  Meclizine Hcl 25 Mg Tabs (Meclizine hcl) .Marland Kitchen.. 1 every 4 hours as needed dizziness 6)  Hydrochlorothiazide 25 Mg Tabs (Hydrochlorothiazide) .... Once daily 7)  Simvastatin 40 Mg Tabs (Simvastatin) .... Take one tablet at bedtime 8)  Macrobid 100 Mg Caps (Nitrofurantoin monohyd macro) .Marland Kitchen.. 1 by mouth two times a day  Other Orders: Nutrition Referral (Nutrition)  Patient Instructions: 1)  It is important that you exercise reguarly at least 20 minutes 5 times a week. If you develop chest pain, have severe difficulty  breathing, or feel very tired, stop exercising immediately and seek medical attention.  2)  You need to lose weight. Consider a lower calorie diet and regular exercise.  3)  Refer to Nutrition.  Prescriptions: INDERAL LA 80 MG CP24 (PROPRANOLOL HCL) Take 1 capsule by mouth once a day  #90 x 3   Entered and Authorized by:   Nelwyn Salisbury MD   Signed by:   Nelwyn Salisbury MD on 06/07/2009   Method used:   Print then Give to Patient   RxID:   [id number]   Preventive Care Screening  Mammogram:    Date:  09/08/2008    Results:  normal

## 2010-05-10 NOTE — Progress Notes (Signed)
Summary: exercis & weight loss update  Phone Note Call from Patient Call back at Work Phone [phone] Call back at until 5   Summary of Call: Doing 30 minutes treadmill or 15 min treadmill & 15 min some other cardio.  2 - 4 times a week.  Heart rate 130 at peak.  Perspiring after about work time.  Is seeing some weight loss 8 lb 4 weeks.  Trying to eat tiny small meals 4-5.  Eats healthy & when hungry.  Is pleased.  Remembers Dr. Carmon Ginsberg saying to strive for 45 minutes 3 times a week.  Is there anything different you would suggest?   Initial call taken by: Rudy Jew, RN,  September 15, 2009 1:52 PM  Follow-up for Phone Call        No just keep this up. Losing 2 pounds a week is an excellent rate to lose at. In 6 months this will add up to 48 lbs.  Follow-up by: Nelwyn Salisbury MD,  September 22, 2009 12:51 PM  Additional Follow-up for Phone Call Additional follow up Details #1::        I tried to call pt's work number but it was the wrong number. I left a message for pt to call back on home number.  Additional Follow-up by: Romualdo Bolk, CMA Duncan Dull),  September 22, 2009 2:21 PM    Additional Follow-up for Phone Call Additional follow up Details #2::    Pt called in and was advised of Dr Claris Che instructions from previous notation... Pt acknowledged same.  Follow-up by: Debbra Riding,  September 24, 2009 11:35 AM

## 2010-05-10 NOTE — Assessment & Plan Note (Signed)
Summary: wheezing/njr   Vital Signs:  Patient Profile:   55 Years Old Female Weight:      187 pounds Temp:     97.7 degrees F oral Pulse rate:   73 / minute BP sitting:   132 / 80  (left arm) Cuff size:   regular  Vitals Entered By: Alfred Levins, CMA (January 21, 2007 9:59 AM)                 Chief Complaint:  Cough and wheezing.  History of Present Illness: 5 days of body aches, wheezes, and productive cough. No HA or ST or fever.   Current Allergies: BIAXIN (CLARITHROMYCIN)  Past Medical History:    Allergic rhinitis    Hypertension    Anxiety    Depression  Past Surgical History:    Sinus surgery    Deviated septum     Review of Systems      See HPI   Physical Exam  General:     Well-developed,well-nourished,in no acute distress; alert,appropriate and cooperative throughout examination Head:     Normocephalic and atraumatic without obvious abnormalities. No apparent alopecia or balding. Eyes:     No corneal or conjunctival inflammation noted. EOMI. Perrla. Funduscopic exam benign, without hemorrhages, exudates or papilledema. Vision grossly normal. Ears:     External ear exam shows no significant lesions or deformities.  Otoscopic examination reveals clear canals, tympanic membranes are intact bilaterally without bulging, retraction, inflammation or discharge. Hearing is grossly normal bilaterally. Nose:     External nasal examination shows no deformity or inflammation. Nasal mucosa are pink and moist without lesions or exudates. Mouth:     Oral mucosa and oropharynx without lesions or exudates.  Teeth in good repair. Neck:     No deformities, masses, or tenderness noted. Lungs:     soft rhonchi and wheezes diffusely    Impression & Recommendations:  Problem # 1:  BRONCHITIS, ACUTE (ICD-466.0)  Her updated medication list for this problem includes:    Zithromax Z-pak 250 Mg Tabs (Azithromycin) .Marland Kitchen... As directed  Orders: Depo- Medrol 80mg   (J1040) Admin of Therapeutic Inj  intramuscular or subcutaneous (53664)   Complete Medication List: 1)  Clonazepam 1 Mg Tabs (Clonazepam) .... As needed 2)  Inderal La 80 Mg Cp24 (Propranolol hcl) .... Take 1 capsule by mouth once a day 3)  Maxzide 75-50 Mg Tabs (Triamterene-hctz) .... Take 1 tablet by mouth every morning 4)  Norvasc 5 Mg Tabs (Amlodipine besylate) .... Take 1 tablet by mouth once a day 5)  Zithromax Z-pak 250 Mg Tabs (Azithromycin) .... As directed   Patient Instructions: 1)  given 80 mg DepoMedrol IM. 2)  Please schedule a follow-up appointment as needed.    Prescriptions: ZITHROMAX Z-PAK 250 MG  TABS (AZITHROMYCIN) as directed  #1 x 0   Entered and Authorized by:   Nelwyn Salisbury MD   Signed by:   Nelwyn Salisbury MD on 01/21/2007   Method used:   Electronically sent to ...       [facility]       [address]       [place], [place]  [address]       Ph: [phone] or [phone]       Fax: [phone]   RxID:   [id number]  ]  Medication Administration  Injection # 1:    Medication: Depo- Medrol 80mg   Diagnosis: BRONCHITIS, ACUTE (ICD-466.0)    Route: IM    Site: LUOQ gluteus    Exp Date: 12/09    Lot #: [id number]    Mfr: Sicor    Patient tolerated injection without complications    Given by: Alfred Levins, CMA (January 21, 2007 10:40 AM)  Orders Added: 1)  Est. Patient Level III [54098] 2)  Depo- Medrol 80mg  [J1040] 3)  Admin of Therapeutic Inj  intramuscular or subcutaneous [90772]

## 2010-05-10 NOTE — Progress Notes (Signed)
Summary: adrenal gland test  Phone Note Call from Patient Call back at Home Phone [phone] Call back at Work Phone [phone]   Caller: Patient Call For: Nelwyn Salisbury MD Summary of Call: pt stated doc want her to have a test of adrenal glands ?growth on glands. Can we sch? Initial call taken by: Heron Sabins,  January 19, 2010 9:20 AM  Follow-up for Phone Call        set up an AM serum free cortisol test [phone])  for dx 227.0  Follow-up by: Nelwyn Salisbury MD,  January 19, 2010 9:47 AM  Additional Follow-up for Phone Call Additional follow up Details #1::        pt is sch for 02-16-2010 8am Additional Follow-up by: Heron Sabins,  January 19, 2010 9:53 AM

## 2010-05-10 NOTE — Assessment & Plan Note (Signed)
Summary: BACK PAIN // RS   Vital Signs:  Patient profile:   55 year old female Weight:      195 pounds BMI:     32.57 Temp:     98.0 degrees F oral BP sitting:   146 / 96  (left arm) Cuff size:   regular  Vitals Entered By: Raechel Ache, RN (July 21, 2009 2:31 PM) CC: C/o LBP, esp @noc . Has not seen urologist yet.   History of Present Illness: Here for intermittent low back pain that has bothered her for 5 years, but more so lately. This is a mild dull ache, not severe. No radiation to the legs. She has taken nothing for it. She is to see Dr. Retta Diones about her UTIs on 08-17-09.   Allergies: 1)  Biaxin (Clarithromycin)  Past History:  Past Medical History: Reviewed history from 06/07/2009 and no changes required. Allergic rhinitis Hypertension Anxiety Depression sees Dr. Ilda Mori for gyn exams GERD Hyperlipidemia hyperglycemia vertigo migraines  Past Surgical History: Reviewed history from 06/07/2009 and no changes required. Sinus surgery Deviated septum repair excision of dorsal ganglion from right wrist 06-05-08 per Dr. Molly Maduro Sypher colonoscopy 05-18-07 per Dr. Melvia Heaps, diverticulosis only, repeat in 10 yrs  Review of Systems  The patient denies anorexia, fever, weight loss, weight gain, vision loss, decreased hearing, hoarseness, chest pain, syncope, dyspnea on exertion, peripheral edema, prolonged cough, headaches, hemoptysis, abdominal pain, melena, hematochezia, severe indigestion/heartburn, hematuria, incontinence, genital sores, muscle weakness, suspicious skin lesions, transient blindness, difficulty walking, depression, unusual weight change, abnormal bleeding, enlarged lymph nodes, angioedema, breast masses, and testicular masses.    Physical Exam  General:  Well-developed,well-nourished,in no acute distress; alert,appropriate and cooperative throughout examination Msk:  No deformity or scoliosis noted of thoracic or lumbar spine.  The lower  back is not tender at all. Her ROM is limited due to poor flexibility. Negative SLR.    Impression & Recommendations:  Problem # 1:  BACK PAIN, LUMBAR (ICD-724.2)  Complete Medication List: 1)  Clonazepam 1 Mg Tabs (Clonazepam) .... Two times a day as needed anxiety 2)  Inderal La 80 Mg Cp24 (Propranolol hcl) .... Take 1 capsule by mouth once a day 3)  Norvasc 5 Mg Tabs (Amlodipine besylate) .... Take 1 tablet by mouth once a day 4)  Potassium Chloride Cr 10 Meq Tbcr (Potassium chloride) .Marland Kitchen.. 1 by mouth once daily 5)  Meclizine Hcl 25 Mg Tabs (Meclizine hcl) .Marland Kitchen.. 1 every 4 hours as needed dizziness 6)  Hydrochlorothiazide 25 Mg Tabs (Hydrochlorothiazide) .... Once daily 7)  Simvastatin 40 Mg Tabs (Simvastatin) .... Take one tablet at bedtime  Patient Instructions: 1)  use Motrin as needed . Advised pilates or yoga exercises to improve core strength and flexibility.  2)  Please schedule a follow-up appointment as needed .

## 2010-05-10 NOTE — Progress Notes (Signed)
Summary: URGENT per pt.  Phone Note Call from Patient   Caller: Patient Call For: Nelwyn Salisbury MD Summary of Call: [phone] Pt is calling in and VERY anxious.  States she Has to get off this med (Prednisone) now.  Wants to speak to Dr. Clent Ridges ASAP or his nurse. Initial call taken by: Lynann Beaver CMA,  December 22, 2009 9:48 AM  Follow-up for Phone Call        spoke with pt stated she can not tolerate prednisone   pls advise  stated she called the call a nurse last nite.staying at [employer] and may not work tomorrow   Follow-up by: Pura Spice, RN,  December 22, 2009 10:32 AM  Additional Follow-up for Phone Call Additional follow up Details #1::        go ahead and stop the Prednisone immediately. She should be feeling better over the next few days Additional Follow-up by: Nelwyn Salisbury MD,  December 22, 2009 11:54 AM    Additional Follow-up for Phone Call Additional follow up Details #2::    pt notified.  Follow-up by: Pura Spice, RN,  December 22, 2009 12:38 PM

## 2010-05-10 NOTE — Consult Note (Signed)
Summary: [facility]  [facility]   Imported By: Maryln Gottron 08/25/2009 13:31:56  _____________________________________________________________________  External Attachment:    Type:   Image     Comment:   External Document

## 2010-05-10 NOTE — Progress Notes (Signed)
Summary: DISREGARD... PT CALLING FOR HER BROTHER.  Phone Note Call from Patient

## 2010-05-10 NOTE — Progress Notes (Signed)
Summary: Call A nurse   Call-A-Nurse Triage Call Report Triage Record Num: [id number] Operator: Tomasita Crumble Patient Name: Sherry Chapman Call Date & Time: 12/20/2009 9:20:19PM Patient Phone: [phone] PCP: Tera Mater. Clent Ridges Patient Gender: Female PCP Fax : [phone] Patient DOB: [birth year] [facility] Name[facility] Reason for Call: Pt. calling. States she has been on Prednisone taper dose since 9/10 for sinus infection. Reports she feels jittery/ nervous, nausea, not sleeping;" feel like I am going to come out of my skin." Supposed to take 2 more tablets tonight; they are 10 mg each. Protocol(s) Used: Medication Question Calls, No Triage (Adults) Recommended Outcome per Protocol: Call Provider within 24 Hours Reason for Outcome: Caller has non urgent medication question about med that PCP prescribed and triager unable to answer question Care Advice:  ~ 09/

## 2010-05-10 NOTE — Progress Notes (Signed)
Summary: ov?  Phone Note Call from Patient Call back at Work Phone [phone]   Caller: Patient---live call Summary of Call: was rx'd Macrobid for an UTI. She has no sxs now. Should she be tested? She did not have sxs at her last visit when she was dx'd with uti. Please advise. Initial call taken by: Warnell Forester,  June 29, 2009 1:36 PM  Follow-up for Phone Call        she can come get a UA, no need for an OV Follow-up by: Nelwyn Salisbury MD,  June 29, 2009 2:01 PM  Additional Follow-up for Phone Call Additional follow up Details #1::        pt will go to lab tomorrow for ua at 11:30am. Additional Follow-up by: Warnell Forester,  June 29, 2009 3:09 PM

## 2010-05-10 NOTE — Progress Notes (Signed)
Summary: UA results  Phone Note Call from Patient   Caller: Patient Call For: Nelwyn Salisbury MD Summary of Call: Pt is calling for a UA report.  [phone].  Initial call taken by: Lynann Beaver CMA,  July 16, 2009 9:07 AM  Follow-up for Phone Call        see results above Follow-up by: Nelwyn Salisbury MD,  July 16, 2009 10:06 AM  Additional Follow-up for Phone Call Additional follow up Details #1::        attempt to call both hm # and listed # - ans mach - LMTCB if questions - urine without bacteria but has some blood in it - Dr. Clent Ridges want to refer to Urology - info to terri and she will call next week once that appt has been made. KIK Additional Follow-up by: Duard Brady LPN,  July 16, 2009 1:16 PM

## 2010-05-10 NOTE — Progress Notes (Signed)
Summary: questions about RX  Phone Note Call from Patient   Caller: Patient Call For: Nelwyn Salisbury MD Summary of Call: Wants to take Potassium OTC 10 meq ??? [phone] Initial call taken by: Lynann Beaver CMA,  June 15, 2009 10:54 AM  Follow-up for Phone Call        she could take this instead of Rx if she wishes Follow-up by: Nelwyn Salisbury MD,  June 15, 2009 3:20 PM  Additional Follow-up for Phone Call Additional follow up Details #1::        Message left with Dr. Claris Che recommendations. Additional Follow-up by: Lynann Beaver CMA,  June 15, 2009 3:24 PM

## 2010-05-10 NOTE — Progress Notes (Signed)
Summary: face itching Augmentin or Prednsione  Phone Note Call from Patient Call back at Work Phone [phone]   Summary of Call: Face itching & getting worse everyday.  Augmentin causing?  Also had nausea & feet not touching ground feeling, out of it, couldn't sleep.  No itching with it, but skin felt funny & facial skin was blotchy.  Could be Prenisone.   No swelling in throat or short breath.  Will take a Zyrtec that she has with her at work.       Initial call taken by: Rudy Jew, RN,  December 27, 2009 10:19 AM  Follow-up for Phone Call        this sounds like the prednisone still. Try antihistamines OTC. if not better soon, come see me Follow-up by: Nelwyn Salisbury MD,  December 27, 2009 10:27 AM  Additional Follow-up for Phone Call Additional follow up Details #1::        Phone Call Completed Additional Follow-up by: Rudy Jew, RN,  December 27, 2009 11:56 AM

## 2010-05-10 NOTE — Progress Notes (Signed)
Summary: Pt is req to have another ua done. Pls call  Phone Note Call from Patient Call back at Work Phone [phone]   Caller: Patient Summary of Call: Pt called and wanted to schedule another ua. Please advise.  Initial call taken by: Lucy Antigua,  July 13, 2009 1:41 PM  Follow-up for Phone Call        attempted to call pt - wk # - ans mach - LMTCB - what signs is she having? KIK Follow-up by: Duard Brady LPN,  July 13, 2009 4:18 PM  Additional Follow-up for Phone Call Additional follow up Details #1::        has finished 2 rounds on abx tx - needs to retest. please advise - KIK Additional Follow-up by: Duard Brady LPN,  July 13, 2009 6:23 PM    Additional Follow-up for Phone Call Additional follow up Details #2::    Agreed. have her come by for a repeat UA Follow-up by: Nelwyn Salisbury MD,  July 14, 2009 8:38 AM  Additional Follow-up for Phone Call Additional follow up Details #3:: Details for Additional Follow-up Action Taken: lab appt made for 07/14/09.  Pt aware. Additional Follow-up by: Gladis Riffle, RN,  July 14, 2009 8:52 AM

## 2010-05-10 NOTE — Assessment & Plan Note (Signed)
Summary: consult re: med for depression/cjr   Vital Signs:  Patient profile:   55 year old female Weight:      190 pounds BP sitting:   130 / 90  (left arm) Cuff size:   regular  Vitals Entered By: Raechel Ache, RN (Sep 03, 2009 11:34 AM) CC: C/o depression.   History of Present Illness: Here to discuss depression. She has had bouts of this throughout her life but has never been treated for it exactly. This has become more of a problem the past 3 weeks. She describes feelings of anxiety, of worrying about things, of sadness and hopelessness, and of being overwhelmed. She goes into detail about how difficult it is to deal with the people at her work. She uses Clonazepam 3 times a week on average.   Allergies: 1)  Biaxin (Clarithromycin)  Past History:  Past Medical History: Reviewed history from 06/07/2009 and no changes required. Allergic rhinitis Hypertension Anxiety Depression sees Dr. Ilda Mori for gyn exams GERD Hyperlipidemia hyperglycemia vertigo migraines  Review of Systems  The patient denies anorexia, fever, weight loss, weight gain, vision loss, decreased hearing, hoarseness, chest pain, syncope, dyspnea on exertion, peripheral edema, prolonged cough, headaches, hemoptysis, abdominal pain, melena, hematochezia, severe indigestion/heartburn, hematuria, incontinence, genital sores, muscle weakness, suspicious skin lesions, transient blindness, difficulty walking, unusual weight change, abnormal bleeding, enlarged lymph nodes, angioedema, breast masses, and testicular masses.    Physical Exam  General:  Well-developed,well-nourished,in no acute distress; alert,appropriate and cooperative throughout examination Psych:  Oriented X3, memory intact for recent and remote, normally interactive, good eye contact, depressed affect, and tearful.     Impression & Recommendations:  Problem # 1:  DEPRESSION (ICD-311)  Her updated medication list for this problem  includes:    Clonazepam 1 Mg Tabs (Clonazepam) .Marland Kitchen..Marland Kitchen Two times a day as needed anxiety    Zoloft 50 Mg Tabs (Sertraline hcl) ..... Once daily  Complete Medication List: 1)  Clonazepam 1 Mg Tabs (Clonazepam) .... Two times a day as needed anxiety 2)  Inderal La 80 Mg Cp24 (Propranolol hcl) .... Take 1 capsule by mouth once a day 3)  Norvasc 5 Mg Tabs (Amlodipine besylate) .... Take 1 tablet by mouth once a day 4)  Potassium Chloride Cr 10 Meq Tbcr (Potassium chloride) .Marland Kitchen.. 1 by mouth once daily 5)  Meclizine Hcl 25 Mg Tabs (Meclizine hcl) .Marland Kitchen.. 1 every 4 hours as needed dizziness 6)  Hydrochlorothiazide 25 Mg Tabs (Hydrochlorothiazide) .... Once daily 7)  Simvastatin 40 Mg Tabs (Simvastatin) .... Take one tablet at bedtime 8)  Zithromax Z-pak 250 Mg Tabs (Azithromycin) .... As directed 9)  Zoloft 50 Mg Tabs (Sertraline hcl) .... Once daily  Patient Instructions: 1)  We spent 40 minutes discussing depression and its treatment. recheck in 3 weeks Prescriptions: ZOLOFT 50 MG TABS (SERTRALINE HCL) once daily  #30 x 2   Entered and Authorized by:   Nelwyn Salisbury MD   Signed by:   Nelwyn Salisbury MD on 09/03/2009   Method used:   Electronically to        [facility] [address] (retail)       [address]       [place], [place]  [address]       Ph: [phone] or [phone]       Fax: [phone]   RxID:   [id number]

## 2010-05-10 NOTE — Progress Notes (Signed)
Summary: note for work and meds for sinus  Phone Note Call from Patient Call back at [facility] Phone [phone]   Caller: Patient Call For: Nelwyn Salisbury MD Summary of Call: Pt is having a severe sinus headache, and wonders what she can do for the sinus condition. Needs a note to return to work tomorrow, and faxed tomorrow to this number [phone].    Initial call taken by: Lynann Beaver CMA,  December 23, 2009 11:39 AM  Follow-up for Phone Call        she may have a sinus infection. Call in Augmentin 875 two times a day for 10 days. She can use Mucinex D two times a day. She may have a work note  Follow-up by: Nelwyn Salisbury MD,  December 23, 2009 12:45 PM  Additional Follow-up for Phone Call Additional follow up Details #1::        done pt aware. pt requestecd work note be faxed tomorrow Additional Follow-up by: Pura Spice, RN,  December 23, 2009 12:51 PM    New/Updated Medications: AUGMENTIN 875-125 MG TABS (AMOXICILLIN-POT CLAVULANATE) 1 by mouth two times a day Prescriptions: AUGMENTIN 875-125 MG TABS (AMOXICILLIN-POT CLAVULANATE) 1 by mouth two times a day  #20 x 0   Entered by:   Pura Spice, RN   Authorized by:   Nelwyn Salisbury MD   Signed by:   Pura Spice, RN on 12/23/2009   Method used:   Electronically to        [facility] [address] (retail)       [address]       [place], [place]  [address]       Ph: [phone] or [phone]       Fax: [phone]   RxID:   [id number]

## 2010-05-10 NOTE — Progress Notes (Signed)
Summary: REQ FOR REFILL / RETURN CALL  Phone Note Call from Patient   Caller: Patient [phone] Reason for Call: Refill Medication Summary of Call: Pt called in to req refills on meds: Potassium Chloride / Inderal La 80 Mg Cp24 (Propranolol Hcl)...... Pt req that both meds be sent into [facility].  Pt also req a return call from [place], [place] .... She adv that she has a question about taking the medication - potassium chloride.Marland Kitchen??  Pt can be reached at [phone].  Initial call taken by: Debbra Riding,  June 17, 2009 12:37 PM  Follow-up for Phone Call        Phone Call Completed, Rx Called In Follow-up by: Alfred Levins, CMA,  June 18, 2009 3:42 PM    Prescriptions: INDERAL LA 80 MG CP24 (PROPRANOLOL HCL) Take 1 capsule by mouth once a day  #90 x 3   Entered by:   Alfred Levins, CMA   Authorized by:   Nelwyn Salisbury MD   Signed by:   Alfred Levins, CMA on 06/18/2009   Method used:   Faxed to ...       [facility]* (mail-order)             ,          Ph: [phone]       Fax: [phone]   RxID:   [id number] POTASSIUM CHLORIDE CR 10 MEQ  TBCR (POTASSIUM CHLORIDE) 1 by mouth once daily  #90 x 3   Entered by:   Alfred Levins, CMA   Authorized by:   Nelwyn Salisbury MD   Signed by:   Alfred Levins, CMA on 06/18/2009   Method used:   Faxed to ...       [facility]* (mail-order)             ,          Ph: [phone]       Fax: [phone]   RxID:   [id number]

## 2010-05-10 NOTE — Assessment & Plan Note (Signed)
Summary: congestion//ccm   Vital Signs:  Patient profile:   55 year old female Weight:      193 pounds BMI:     32.23 Temp:     98.4 degrees F oral BP sitting:   132 / 86  (left arm) Cuff size:   regular  Vitals Entered By: Raechel Ache, RN (Aug 31, 2009 3:52 PM) CC: C/o sinus and ear congestion, cough off & on, face sore.   History of Present Illness: Here for one week of sinus pressure, HA, PND, and a dry cough. No fever. On Mucinex.   Allergies: 1)  Biaxin (Clarithromycin)  Past History:  Past Medical History: Reviewed history from 06/07/2009 and no changes required. Allergic rhinitis Hypertension Anxiety Depression sees Dr. Ilda Mori for gyn exams GERD Hyperlipidemia hyperglycemia vertigo migraines  Review of Systems  The patient denies anorexia, fever, weight loss, weight gain, vision loss, decreased hearing, hoarseness, chest pain, syncope, dyspnea on exertion, peripheral edema, hemoptysis, abdominal pain, melena, hematochezia, severe indigestion/heartburn, hematuria, incontinence, genital sores, muscle weakness, suspicious skin lesions, transient blindness, difficulty walking, depression, unusual weight change, abnormal bleeding, enlarged lymph nodes, angioedema, breast masses, and testicular masses.    Physical Exam  General:  Well-developed,well-nourished,in no acute distress; alert,appropriate and cooperative throughout examination Head:  Normocephalic and atraumatic without obvious abnormalities. No apparent alopecia or balding. Eyes:  No corneal or conjunctival inflammation noted. EOMI. Perrla. Funduscopic exam benign, without hemorrhages, exudates or papilledema. Vision grossly normal. Ears:  External ear exam shows no significant lesions or deformities.  Otoscopic examination reveals clear canals, tympanic membranes are intact bilaterally without bulging, retraction, inflammation or discharge. Hearing is grossly normal bilaterally. Nose:  External  nasal examination shows no deformity or inflammation. Nasal mucosa are pink and moist without lesions or exudates. Mouth:  Oral mucosa and oropharynx without lesions or exudates.  Teeth in good repair. Neck:  No deformities, masses, or tenderness noted. Lungs:  Normal respiratory effort, chest expands symmetrically. Lungs are clear to auscultation, no crackles or wheezes.   Impression & Recommendations:  Problem # 1:  ACUTE SINUSITIS, UNSPECIFIED (ICD-461.9)  Her updated medication list for this problem includes:    Zithromax Z-pak 250 Mg Tabs (Azithromycin) .Marland Kitchen... As directed  Orders: Depo- Medrol 80mg  (J1040) Admin of Therapeutic Inj  intramuscular or subcutaneous ([address])  Complete Medication List: 1)  Clonazepam 1 Mg Tabs (Clonazepam) .... Two times a day as needed anxiety 2)  Inderal La 80 Mg Cp24 (Propranolol hcl) .... Take 1 capsule by mouth once a day 3)  Norvasc 5 Mg Tabs (Amlodipine besylate) .... Take 1 tablet by mouth once a day 4)  Potassium Chloride Cr 10 Meq Tbcr (Potassium chloride) .Marland Kitchen.. 1 by mouth once daily 5)  Meclizine Hcl 25 Mg Tabs (Meclizine hcl) .Marland Kitchen.. 1 every 4 hours as needed dizziness 6)  Hydrochlorothiazide 25 Mg Tabs (Hydrochlorothiazide) .... Once daily 7)  Simvastatin 40 Mg Tabs (Simvastatin) .... Take one tablet at bedtime 8)  Zithromax Z-pak 250 Mg Tabs (Azithromycin) .... As directed  Patient Instructions: 1)  Please schedule a follow-up appointment as needed .  Prescriptions: ZITHROMAX Z-PAK 250 MG TABS (AZITHROMYCIN) as directed  #1 x 0   Entered and Authorized by:   Nelwyn Salisbury MD   Signed by:   Nelwyn Salisbury MD on 08/31/2009   Method used:   Electronically to        [facility] [address] (retail)       [address]       [place], [place]  [address]       Ph: [phone] or [phone]       Fax: [phone]   RxID:   [id number]    Medication Administration  Injection # 1:    Medication:  Depo- Medrol 80mg     Diagnosis: ACUTE SINUSITIS, UNSPECIFIED (ICD-461.9)    Route: IM    Site: LUOQ gluteus    Exp Date: 11/13    Lot #: [id number]    Mfr: [employer]    Comments: 160 mg given    Patient tolerated injection without complications    Given by: Raechel Ache, RN (Aug 31, 2009 4:58 PM)  Orders Added: 1)  Est. Patient Level IV [47425] 2)  Depo- Medrol 80mg  [J1040] 3)  Admin of Therapeutic Inj  intramuscular or subcutaneous [95638]

## 2010-05-10 NOTE — Progress Notes (Signed)
  Phone Note Call from Patient Call back at [facility] Phone [phone] Call back at Work Phone [phone]   Caller: Patient Call For: Nelwyn Salisbury MD Summary of Call: Pt is having Cortisol level drawn next week and wants to know if she is to stop any of her meds? Initial call taken by: [facility] CMA AAMA,  February 11, 2010 11:13 AM  Follow-up for Phone Call        No her other meds would not affect this at all  Follow-up by: Nelwyn Salisbury MD,  February 11, 2010 2:54 PM  Additional Follow-up for Phone Call Additional follow up Details #1::        Left message on detailed voice mail. Additional Follow-up by: Lynann Beaver CMA AAMA,  February 11, 2010 3:38 PM

## 2010-05-10 NOTE — Progress Notes (Signed)
Summary: lab results  Phone Note Call from Patient Call back at Work Phone [phone]   Caller: Patient Call For: Nelwyn Salisbury MD Summary of Call: Pt would like lab results. Initial call taken by: Lynann Beaver CMA AAMA,  February 22, 2010 10:38 AM  Follow-up for Phone Call        they are not back yet  Follow-up by: Nelwyn Salisbury MD,  February 22, 2010 5:39 PM  Additional Follow-up for Phone Call Additional follow up Details #1::        pt notifed of results  Additional Follow-up by: Pura Spice, RN,  February 23, 2010 8:41 AM

## 2010-05-10 NOTE — Progress Notes (Signed)
Summary: Call A Nurse   Call-A-Nurse Triage Call Report Triage Record Num: [id number] Operator: Tomasita Crumble Patient Name: Sherry Chapman Call Date & Time: 12/20/2009 9:41:21PM Patient Phone: [phone] PCP: Tera Mater. Clent Ridges Patient Gender: Female PCP Fax : [phone] Patient DOB: [birth year] [facility] Name[facility] Reason for Call: Follow up to previous call 12/20/2009 @ 21:20. Dr.Aron - on call ordered that patient should call the office for follow up in am; do not stop medication "cold [place]". Caller informed of same. Protocol(s) Used: Office Note Recommended Outcome per Protocol: Information Noted and Sent to Office Reason for Outcome: Caller information to office Care Advice:  ~

## 2010-05-10 NOTE — Progress Notes (Signed)
Summary: Patient wants lab results  Phone Note Call from Patient Call back at Work Phone [phone]   Caller: Patient Reason for Call: Talk to Nurse Summary of Call: Checking for results of lab work done 3/23. Initial call taken by: Everrett Coombe,  July 01, 2009 2:15 PM  Follow-up for Phone Call        see the UA report Follow-up by: Nelwyn Salisbury MD,  July 02, 2009 10:32 AM  Additional Follow-up for Phone Call Additional follow up Details #1::        Phone Call Completed Additional Follow-up by: Raechel Ache, RN,  July 02, 2009 11:04 AM

## 2010-05-10 NOTE — Progress Notes (Signed)
Summary: Call-A-Nurse Report    Call-A-Nurse Triage Call Report Triage Record Num: [id number] Operator: Arline Asp Loftin Patient Name: Sherry Chapman Call Date & Time: 09/03/2009 8:29:22PM Patient Phone: [phone] PCP: Tera Mater. Clent Ridges Patient Gender: Female PCP Fax : [phone] Patient DOB: 1956/01/21 Practice Name: [facility] Reason for Call: Husband/John. Seen per Dr. Clent Ridges on 05/27 and prescribed Zoloft. Pt received phone message from pharmacy that there are drug interactions between the Zoloft and Propanolol when taken together. Advised to take one in the morning, and one in the evening. Due to the Propanolol being an extended release form, advised to follow up with provider on 05/31 for assistance. Protocol(s) Used: Medication Question Calls, No Triage (Adults) Recommended Outcome per Protocol: Call Provider within 24 Hours Reason for Outcome: Caller has medication question about med not prescribed by PCP and triager unable to answer question (e.g. compatibility with other med, storage) Care Advice:  ~ 09/03/2009 8:45:05PM Page 1 of 1 CAN_TriageRpt_V2  Appended Document: Call-A-Nurse Report please call the patient and tell her that there is NOT an interaction that she needs to be afraid of betrween these meds. Go ahead and start the Zoloft as prescribed.   Appended Document: Call-A-Nurse Report Left message to call back.  Appended Document: Call-A-Nurse Report Patient called back & requested call back [phone] & probable need for different med.    Appended Document: Call-A-Nurse Report Gave Dr. Claris Che message to patient.  She says/asks Dr. 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  Would prefer different med.  HT [place].  NKDA.  Biaxin causes nausea.      Appended Document: Call-A-Nurse Report I disagree, but that's fine. Cancel Zoloft, and call in Wellbutrin XL 150mg  once daily , #30 with 2 rf. Follow up as planned.    Appended Document: Call-A-Nurse Report    Clinical Lists Changes  Medications: Removed medication of ZOLOFT 50 MG TABS (SERTRALINE HCL) once daily Added new medication of WELLBUTRIN XL 150 MG XR24H-TAB (BUPROPION HCL) 1 once daily - Signed Rx of WELLBUTRIN XL 150 MG XR24H-TAB (BUPROPION HCL) 1 once daily;  #30 x 2;  Signed;  Entered by: Raechel Ache, RN;  Authorized by: Nelwyn Salisbury MD;  Method used: Electronically to [facility] [address] [place], [place], [place]  [address], Ph: [phone] or [phone], Fax: [phone]    Prescriptions: WELLBUTRIN XL 150 MG XR24H-TAB (BUPROPION HCL) 1 once daily  #30 x 2   Entered by:   Raechel Ache, RN   Authorized by:   Nelwyn Salisbury MD   Signed by:   Raechel Ache, RN on 09/07/2009   Method used:   Electronically to        [facility] [address] (retail)       [address]       [place], [place]  [address]       Ph: [phone] or [phone]       Fax: [phone]   RxID:   [id number]

## 2010-05-10 NOTE — Letter (Signed)
Summary: [facility]  [facility]   Imported By: Maryln Gottron 10/12/2009 12:41:54  _____________________________________________________________________  External Attachment:    Type:   Image     Comment:   External Document

## 2010-05-10 NOTE — Progress Notes (Signed)
Summary: add mercury  Phone Note Call from Patient Call back at Home Phone [phone]   Caller: Patient Call For: fry Summary of Call: pt has alot of dental work and wants her mercury level checked, she wants to know if its high what you would do about it. Initial call taken by: Alfred Levins, CMA,  June 03, 2009 11:04 AM  Follow-up for Phone Call        it is extremely unlikely that her mercury level is affected by her dental work. however if she really wants to check it this is odne with a 24 hour urine collection rather than a blood draw. We can set this up if she wishes Follow-up by: Nelwyn Salisbury MD,  June 03, 2009 11:33 AM  Additional Follow-up for Phone Call Additional follow up Details #1::        LMTCB  Alfred Levins, CMA  June 03, 2009 11:47 AM

## 2010-05-10 NOTE — Progress Notes (Signed)
Summary: knee pain  Phone Note Call from Patient   Caller: Patient Call For: Nelwyn Salisbury MD Summary of Call: Pt is having knee pain x 2 weeks.  Started on treadmill, and wants to know if she can continue it?  No known injury.  [phone] Initial call taken by: Lynann Beaver CMA,  Aug 12, 2009 4:22 PM  Follow-up for Phone Call        stop the treadmill for one week, then gradually try to get back on it Follow-up by: Nelwyn Salisbury MD,  Aug 13, 2009 1:06 PM  Additional Follow-up for Phone Call Additional follow up Details #1::        Pt notified of Dr. Claris Che recommendations. Additional Follow-up by: Lynann Beaver CMA,  Aug 13, 2009 1:35 PM

## 2010-05-12 ENCOUNTER — Telehealth: Payer: Self-pay | Admitting: *Deleted

## 2010-05-12 NOTE — Progress Notes (Signed)
Summary: ? different med  Phone Note Call from Patient Call back at Work Phone [phone]   Caller: Patient Call For: Nelwyn Salisbury MD Summary of Call: Pt states Levaquin is giving her diarrhea.  Needs a different RX.  [facility])  Initial call taken by: Lynann Beaver CMA AAMA,  March 31, 2010 10:57 AM  Follow-up for Phone Call        does she have fever blood on the stool ? Please  document why she thinks the levaquin is doing this. to see if we need to add this to ther "allergy" list  can change to doxycycline 100 mg1 by mouth two times a day for 10 days  Follow-up by: Madelin Headings MD,  March 31, 2010 12:20 PM  Additional Follow-up for Phone Call Additional follow up Details #1::        No blood in stool. Did not have diarrhea until she took Levaquin.  Doxycycline called to the pharmacy. [facility]) Additional Follow-up by: Lynann Beaver CMA AAMA,  March 31, 2010 2:05 PM   New Allergies: ! LEVAQUIN (LEVOFLOXACIN) New/Updated Medications: DOXYCYCLINE HYCLATE 100 MG CAPS (DOXYCYCLINE HYCLATE) one by mouth two times a day x 10 days with food New Allergies: ! LEVAQUIN (LEVOFLOXACIN)Prescriptions: DOXYCYCLINE HYCLATE 100 MG CAPS (DOXYCYCLINE HYCLATE) one by mouth two times a day x 10 days with food  #20 x 0   Entered by:   Lynann Beaver CMA AAMA   Authorized by:   Madelin Headings MD   Signed by:   Lynann Beaver CMA AAMA on 03/31/2010   Method used:   Electronically to        [facility] [address] [address] (retail)       [address]       [place], [place]  [address]       Ph: [phone] or [phone]       Fax: [phone]   RxID:   [id number]

## 2010-05-12 NOTE — Telephone Encounter (Signed)
No Tdap since we went on EMR.  ER visit for possible rabies?

## 2010-05-12 NOTE — Assessment & Plan Note (Signed)
Summary: ?sinus inf/excess drainage/headaches/sneezing/cjr   Vital Signs:  Patient profile:   55 year old female Weight:      200 pounds O2 Sat:      94 % Temp:     97.6 degrees F Pulse rate:   81 / minute BP sitting:   140 / 82  (left arm) Cuff size:   large  Vitals Entered By: Pura Spice, RN (March 28, 2010 4:58 PM) CC: sinus drainage  states head hurts when bends over laughs or cough .    History of Present Illness: here for 3 months of intermittent sinus symptoms that have been worse for the past 3 weeks. She has sinus pressure, HA, PND, and blowing yellow mucus from the nose. No cough or fever. On Mucinex.   Allergies: 1)  Biaxin (Clarithromycin)  Past History:  Past Medical History: Reviewed history from 06/07/2009 and no changes required. Allergic rhinitis Hypertension Anxiety Depression sees Dr. Ilda Mori for gyn exams GERD Hyperlipidemia hyperglycemia vertigo migraines  Review of Systems  The patient denies anorexia, fever, weight loss, weight gain, vision loss, decreased hearing, hoarseness, chest pain, syncope, dyspnea on exertion, peripheral edema, prolonged cough, hemoptysis, abdominal pain, melena, hematochezia, severe indigestion/heartburn, hematuria, incontinence, genital sores, muscle weakness, suspicious skin lesions, transient blindness, difficulty walking, depression, unusual weight change, abnormal bleeding, enlarged lymph nodes, angioedema, breast masses, and testicular masses.    Physical Exam  General:  Well-developed,well-nourished,in no acute distress; alert,appropriate and cooperative throughout examination Head:  Normocephalic and atraumatic without obvious abnormalities. No apparent alopecia or balding. Eyes:  No corneal or conjunctival inflammation noted. EOMI. Perrla. Funduscopic exam benign, without hemorrhages, exudates or papilledema. Vision grossly normal. Ears:  External ear exam shows no significant lesions or  deformities.  Otoscopic examination reveals clear canals, tympanic membranes are intact bilaterally without bulging, retraction, inflammation or discharge. Hearing is grossly normal bilaterally. Nose:  External nasal examination shows no deformity or inflammation. Nasal mucosa are pink and moist without lesions or exudates. Mouth:  Oral mucosa and oropharynx without lesions or exudates.  Teeth in good repair. Neck:  No deformities, masses, or tenderness noted. Lungs:  Normal respiratory effort, chest expands symmetrically. Lungs are clear to auscultation, no crackles or wheezes.   Impression & Recommendations:  Problem # 1:  ACUTE SINUSITIS, UNSPECIFIED (ICD-461.9)  The following medications were removed from the medication list:    Augmentin 875-125 Mg Tabs (Amoxicillin-pot clavulanate) .Marland Kitchen... 1 by mouth two times a day Her updated medication list for this problem includes:    Levaquin 500 Mg Tabs (Levofloxacin) ..... Once daily  Complete Medication List: 1)  Clonazepam 1 Mg Tabs (Clonazepam) .... Two times a day as needed anxiety 2)  Inderal La 80 Mg Cp24 (Propranolol hcl) .... Take 1 capsule by mouth once a day 3)  Norvasc 5 Mg Tabs (Amlodipine besylate) .... Take 1 tablet by mouth once a day 4)  Potassium Chloride Cr 10 Meq Tbcr (Potassium chloride) .Marland Kitchen.. 1 by mouth once daily 5)  Hydrochlorothiazide 25 Mg Tabs (Hydrochlorothiazide) .... Once daily 6)  Simvastatin 40 Mg Tabs (Simvastatin) .... Take one tablet at bedtime 7)  Levaquin 500 Mg Tabs (Levofloxacin) .... Once daily  Patient Instructions: 1)  Please schedule a follow-up appointment as needed .  Prescriptions: LEVAQUIN 500 MG TABS (LEVOFLOXACIN) once daily  #28 x 0   Entered and Authorized by:   Nelwyn Salisbury MD   Signed by:   Nelwyn Salisbury MD on 03/28/2010   Method used:  Electronically to        [facility] (retail)       [address]       [place], [place]   [address]       Ph: [phone] or [phone]       Fax: [phone]   RxID:   [id number]    Orders Added: 1)  Est. Patient Level IV [40347]

## 2010-05-12 NOTE — Telephone Encounter (Signed)
Pt has a cat scratch from a homeless cat.  Has been feeding for one year.  Needs to know if she needs antibiotics and last tetanus.  Rabies?

## 2010-05-13 ENCOUNTER — Telehealth: Payer: Self-pay | Admitting: *Deleted

## 2010-05-13 NOTE — Telephone Encounter (Signed)
agreed

## 2010-05-13 NOTE — Telephone Encounter (Signed)
See her note from later that day

## 2010-05-13 NOTE — Telephone Encounter (Signed)
Pt advised to go straight to the ER for animal bite protocol.  Dr.  Clent Ridges notified.

## 2010-05-20 ENCOUNTER — Other Ambulatory Visit: Payer: Self-pay | Admitting: Family Medicine

## 2010-05-20 DIAGNOSIS — I10 Essential (primary) hypertension: Secondary | ICD-10-CM

## 2010-05-30 ENCOUNTER — Other Ambulatory Visit: Payer: Self-pay

## 2010-05-30 DIAGNOSIS — F419 Anxiety disorder, unspecified: Secondary | ICD-10-CM

## 2010-05-30 MED ORDER — CLONAZEPAM 1 MG PO TABS: 1.0000 mg | ORAL_TABLET | Freq: Two times a day (BID) | ORAL | Status: DC | PRN

## 2010-05-30 NOTE — Telephone Encounter (Signed)
Faxed request to [facility].

## 2010-05-30 NOTE — Telephone Encounter (Signed)
Call in a 6 month supply  

## 2010-06-23 ENCOUNTER — Encounter: Payer: Self-pay | Admitting: Family Medicine

## 2010-06-23 ENCOUNTER — Ambulatory Visit (INDEPENDENT_AMBULATORY_CARE_PROVIDER_SITE_OTHER): Payer: 59 | Admitting: Family Medicine

## 2010-06-23 VITALS — BP 130/80 | HR 71 | Temp 97.5°F

## 2010-06-23 DIAGNOSIS — M545 Low back pain: Secondary | ICD-10-CM

## 2010-06-23 MED ORDER — CYCLOBENZAPRINE HCL 10 MG PO TABS: 10.0000 mg | ORAL_TABLET | Freq: Three times a day (TID) | ORAL | Status: DC | PRN

## 2010-06-23 NOTE — Progress Notes (Signed)
  Subjective:    Patient ID: Sherry Chapman, female    DOB: 02/11/1956, 55 y.o.   MRN: [id number]  HPI Here for several weeks of stiffness and pain in the lower back. No recent trauma. Her back has bothered her off and on for years. She works out at [employer] several days a week on the treadmill and using the machines. No pain in the legs, no numbness or weakness. Advil helps.    Review of Systems  Constitutional: Negative.   Musculoskeletal: Positive for back pain. Negative for myalgias, joint swelling and arthralgias.       Objective:   Physical Exam  Constitutional: She appears well-developed and well-nourished.  Musculoskeletal:       Mildly tender in the lower back. Full ROM. Negative SLR          Assessment & Plan:  [facility] and [facility]. Try Aleeve bid .

## 2010-07-05 ENCOUNTER — Ambulatory Visit (INDEPENDENT_AMBULATORY_CARE_PROVIDER_SITE_OTHER): Payer: 59 | Admitting: Gynecology

## 2010-07-05 ENCOUNTER — Other Ambulatory Visit (HOSPITAL_COMMUNITY)
Admission: RE | Admit: 2010-07-05 | Discharge: 2010-07-05 | Disposition: A | Discharge status: Home / Self Care | Payer: 59 | Source: Ambulatory Visit | Attending: Gynecology | Admitting: Gynecology

## 2010-07-05 ENCOUNTER — Other Ambulatory Visit: Payer: Self-pay | Admitting: Gynecology

## 2010-07-05 DIAGNOSIS — Z124 Encounter for screening for malignant neoplasm of cervix: Secondary | ICD-10-CM | POA: Insufficient documentation

## 2010-07-05 DIAGNOSIS — B373 Candidiasis of vulva and vagina: Secondary | ICD-10-CM

## 2010-07-05 DIAGNOSIS — Z01419 Encounter for gynecological examination (general) (routine) without abnormal findings: Secondary | ICD-10-CM

## 2010-07-18 ENCOUNTER — Telehealth: Payer: Self-pay | Admitting: *Deleted

## 2010-07-18 MED ORDER — HYDROCODONE-HOMATROPINE 5-1.5 MG/5ML PO SYRP
5.0000 mL | ORAL_SOLUTION | Freq: Four times a day (QID) | ORAL | Status: AC | PRN
Start: 2010-07-18 — End: 2010-07-28

## 2010-07-18 NOTE — Telephone Encounter (Signed)
Pt went to Urgent Care this weekend with sore throat and was given Claritin, and Amoxicillin.   Has now developed, a cough that keeps her up all night and severe nasal congestion with ears stopped up.  Would like cough meds and Rx to open up her head and chest.

## 2010-07-18 NOTE — Telephone Encounter (Signed)
rx called in for hydromet and pt aware.

## 2010-07-18 NOTE — Telephone Encounter (Signed)
Call in Hydromet, 1 tsp q 4 hours prn cough, 240 ml with no rf

## 2010-07-26 ENCOUNTER — Encounter: Payer: Self-pay | Admitting: Family Medicine

## 2010-07-26 ENCOUNTER — Encounter: Payer: Self-pay | Admitting: Internal Medicine

## 2010-07-26 ENCOUNTER — Ambulatory Visit (INDEPENDENT_AMBULATORY_CARE_PROVIDER_SITE_OTHER): Payer: 59 | Admitting: Internal Medicine

## 2010-07-26 VITALS — BP 152/92 | HR 79 | Wt 197.0 lb

## 2010-07-26 DIAGNOSIS — J40 Bronchitis, not specified as acute or chronic: Secondary | ICD-10-CM

## 2010-07-26 DIAGNOSIS — R062 Wheezing: Secondary | ICD-10-CM

## 2010-07-26 LAB — BASIC METABOLIC PANEL
BUN: 14 mg/dL (ref 6–23)
CO2: 29 mEq/L (ref 19–32)
Calcium: 9.5 mg/dL (ref 8.4–10.5)
Chloride: 101 mEq/L (ref 96–112)
Creatinine, Ser: 0.85 mg/dL (ref 0.4–1.2)
GFR calc Af Amer: >60 mL/min (ref >60)
GFR calc non Af Amer: >60 mL/min (ref >60)
Glucose, Bld: 97 mg/dL (ref 70–99)
Potassium: 4.8 mEq/L (ref 3.5–5.1)
Sodium: 136 mEq/L (ref 135–145)

## 2010-07-26 LAB — POCT HEMOGLOBIN-HEMACUE: Hemoglobin: 14.1 g/dL (ref 12.0–15.0)

## 2010-07-26 MED ORDER — ALBUTEROL SULFATE (2.5 MG/3ML) 0.083% IN NEBU: 2.5000 mg | INHALATION_SOLUTION | RESPIRATORY_TRACT | Status: DC

## 2010-07-26 MED ORDER — ALBUTEROL SULFATE (2.5 MG/3ML) 0.083% IN NEBU
2.5000 mg | INHALATION_SOLUTION | Freq: Once | RESPIRATORY_TRACT | Status: AC
  Administered 2010-07-26: 2.5 mg via RESPIRATORY_TRACT

## 2010-07-26 MED ORDER — METHYLPREDNISOLONE ACETATE 40 MG/ML IJ SUSP
40.0000 mg | Freq: Once | INTRAMUSCULAR | Status: AC
  Administered 2010-07-26: 40 mg via INTRAMUSCULAR

## 2010-07-27 ENCOUNTER — Telehealth: Payer: Self-pay | Admitting: *Deleted

## 2010-07-27 ENCOUNTER — Ambulatory Visit (INDEPENDENT_AMBULATORY_CARE_PROVIDER_SITE_OTHER)
Admission: RE | Admit: 2010-07-27 | Discharge: 2010-07-27 | Disposition: A | Discharge status: Home / Self Care | Payer: 59 | Source: Ambulatory Visit | Attending: Internal Medicine | Admitting: Internal Medicine

## 2010-07-27 ENCOUNTER — Emergency Department (HOSPITAL_COMMUNITY)
Admission: EM | Admit: 2010-07-27 | Discharge: 2010-07-27 | Disposition: A | Discharge status: Home / Self Care | Payer: 59 | Attending: Emergency Medicine | Admitting: Emergency Medicine

## 2010-07-27 ENCOUNTER — Ambulatory Visit: Payer: 59 | Admitting: Family Medicine

## 2010-07-27 ENCOUNTER — Encounter: Payer: Self-pay | Admitting: Internal Medicine

## 2010-07-27 DIAGNOSIS — R450 Nervousness: Secondary | ICD-10-CM | POA: Insufficient documentation

## 2010-07-27 DIAGNOSIS — T380X5A Adverse effect of glucocorticoids and synthetic analogues, initial encounter: Secondary | ICD-10-CM | POA: Insufficient documentation

## 2010-07-27 DIAGNOSIS — R062 Wheezing: Secondary | ICD-10-CM

## 2010-07-27 DIAGNOSIS — G4700 Insomnia, unspecified: Secondary | ICD-10-CM | POA: Insufficient documentation

## 2010-07-27 DIAGNOSIS — Z888 Allergy status to other drugs, medicaments and biological substances status: Secondary | ICD-10-CM | POA: Insufficient documentation

## 2010-07-27 DIAGNOSIS — R209 Unspecified disturbances of skin sensation: Secondary | ICD-10-CM | POA: Insufficient documentation

## 2010-07-27 DIAGNOSIS — I10 Essential (primary) hypertension: Secondary | ICD-10-CM | POA: Insufficient documentation

## 2010-07-27 DIAGNOSIS — Z79899 Other long term (current) drug therapy: Secondary | ICD-10-CM | POA: Insufficient documentation

## 2010-07-27 NOTE — Assessment & Plan Note (Signed)
No acute distress. Given albuterol neb x one. Depomedrol 40mg  im.  Avoid po steroids. Followup closely if no improvement or worsening.

## 2010-07-27 NOTE — Telephone Encounter (Signed)
Call-A-Nurse Triage Call Report Triage Record Num: [id number] Operator: Amy Head Patient Name: Sherry Chapman Call Date & Time: 07/27/2010 1:19:46AM Patient Phone: [phone] PCP: Tera Mater. Clent Ridges Patient Gender: Female PCP Fax : [phone] Patient DOB: [birth year] [facility] Name: [facility] Reason for Call: [name] calling and states that she was seen in UC last week for Bronchitis and was given an IM injection of Prednisone and did fine. (she is severely allergic to oral prednisone) Went to office today and was seen by "another Dr., not Dr. Abran Cantor and told them that she had an IM injection of Prednisone last week at the [facility] and was unaffected by it". MD gave another IM injection of Prednisone and per pt request called UC to verify dosage that was given to pt there. States that she thinks she was given too high of a dosage of the Prednisone and is "having an out of body experience, can not feel her feet on the floor and when she touches something can not feel that either". Advised to take Benadryl and call 911 per guideline. Protocol(s) Used: Allergic Reaction, Severe Recommended Outcome per Protocol: Activate EMS 911 Reason for Outcome: History of severe reaction after previous similar exposure Care Advice: ~ Place patient in position of comfort where breathing is easiest. ~ Do not give the patient anything to eat or drink. ~ IMMEDIATE ACTION Write down provider's name. List or place the following in a bag for transport with the patient: current prescription and/or nonprescription medications; alternative treatments, therapies and medications; and street drugs. ~ If immediately available, consider taking an appropriate dose of a nonprescription antihistamine (e.g., Benadryl) orally as directed by the label or a pharmacist. These medications may cause drowsiness and should be taken with caution by adults 75 years or older. ~ 07/27/2010 1:33:23AM Page 1 of 1 CAN_TriageRpt_V2

## 2010-07-27 NOTE — Progress Notes (Signed)
  Subjective:    Patient ID: Sherry Chapman, female    DOB: December 01, 1955, 55 y.o.   MRN: [id number]  HPI Pt presents to clinic for evaluation of wheezing. Recently evaluated for bronchitis and was changed from amoxicillin x4d to ceftin which she is currently on without adverse effect. Received depomedrol and is using albuterol mdi prn. Has intermittent wheezing and possible intermittent chills without fever. States cannot tolerate prednisone orally. No exacerbating or alleviating factors.  Reviewed pmh, medications and allergies    Review of Systems  Constitutional: Positive for chills. Negative for fever.  HENT: Positive for congestion. Negative for facial swelling and rhinorrhea.   Respiratory: Positive for shortness of breath and wheezing.        Objective:   Physical Exam  Nursing note and vitals reviewed. Constitutional: She appears well-developed and well-nourished. No distress.  HENT:  Head: Normocephalic and atraumatic.  Right Ear: External ear normal.  Left Ear: External ear normal.  Nose: Nose normal.  Mouth/Throat: Oropharynx is clear and moist. No oropharyngeal exudate.  Eyes: Conjunctivae are normal. No scleral icterus.  Neck: No JVD present.  Cardiovascular: Normal rate, regular rhythm and normal heart sounds.  Exam reveals no gallop and no friction rub.   No murmur heard. Pulmonary/Chest: Effort normal and breath sounds normal. No respiratory distress. She has no wheezes. She has no rales.  Lymphadenopathy:    She has no cervical adenopathy.  Neurological: She is alert.  Skin: Skin is warm and dry. She is not diaphoretic.  Psychiatric: She has a normal mood and affect.          Assessment & Plan:

## 2010-07-27 NOTE — Assessment & Plan Note (Signed)
Continue current abx tx. Obtain CXR given dyspnea and chills.

## 2010-07-28 ENCOUNTER — Telehealth: Payer: Self-pay | Admitting: *Deleted

## 2010-07-28 NOTE — Telephone Encounter (Signed)
Pt would like results of chest xray results.

## 2010-07-28 NOTE — Telephone Encounter (Signed)
Pt aware.

## 2010-07-28 NOTE — Telephone Encounter (Signed)
Message copied by Kyung Rudd on Thu Jul 28, 2010 11:08 AM ------      Message from: Letitia Libra, Maisie Fus      Created: Wed Jul 27, 2010  5:36 PM       cxr nl

## 2010-08-07 ENCOUNTER — Other Ambulatory Visit: Payer: Self-pay | Admitting: Family Medicine

## 2010-08-17 ENCOUNTER — Telehealth: Payer: Self-pay | Admitting: *Deleted

## 2010-08-17 NOTE — Telephone Encounter (Signed)
Have her make an OV to see me. We have not discussed her sinus problems for a long time

## 2010-08-17 NOTE — Telephone Encounter (Signed)
Pt is still having sinus complaints and would like a referral to Dr. Suzanna Obey at [facility] if Dr. Clent Ridges thinks that would be appropriate.

## 2010-08-22 ENCOUNTER — Encounter: Payer: Self-pay | Admitting: Family Medicine

## 2010-08-22 ENCOUNTER — Ambulatory Visit (INDEPENDENT_AMBULATORY_CARE_PROVIDER_SITE_OTHER): Payer: 59 | Admitting: Family Medicine

## 2010-08-22 VITALS — BP 120/78 | HR 64 | Temp 98.0°F | Resp 14 | Wt 195.0 lb

## 2010-08-22 DIAGNOSIS — J309 Allergic rhinitis, unspecified: Secondary | ICD-10-CM

## 2010-08-22 MED ORDER — FLUTICASONE PROPIONATE 50 MCG/ACT NA SUSP: 2.0000 | Freq: Every day | NASAL | Status: DC

## 2010-08-22 NOTE — Progress Notes (Signed)
  Subjective:    Patient ID: Sherry Chapman, female    DOB: 1955/07/28, 55 y.o.   MRN: [id number]  HPI Here for chronic sinus pressure, PND, and stuffy nose. Every morning she describes "hacking out" large amounts of clear mucus when she gets out of the shower. She had sinus surgery in her 20's, but has had problems all her life. Using Zyrtec with no relief. She was treated for bronchitis last month, and this has totally resolved.    Review of Systems  Constitutional: Negative.   HENT: Positive for congestion, postnasal drip and sinus pressure.   Eyes: Negative.   Respiratory: Negative.        Objective:   Physical Exam  Constitutional: She appears well-developed and well-nourished.  HENT:  Right Ear: External ear normal.  Left Ear: External ear normal.  Mouth/Throat: Oropharynx is clear and moist. No oropharyngeal exudate.       Her nasal passages are extremely narrowed and have swollen membranes   Neck: Normal range of motion. Neck supple.  Pulmonary/Chest: Effort normal and breath sounds normal.  Lymphadenopathy:    She has no cervical adenopathy.          Assessment & Plan:  She has chronic rhinitis and sinus congestion. Try Flonase sprays daily. She already has an appointment to see ENT in 3 weeks.

## 2010-08-23 NOTE — Op Note (Signed)
[name], [name]                 ACCOUNT NO.:  [id number]   MEDICAL RECORD NO.:  [id number]          PATIENT TYPE:  AMB   LOCATION:  DSC                          FACILITY:  [facility]   PHYSICIAN:  Katy Fitch. Sypher, M.D. 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   OPERATION:  Left wrist arthrotomy and debridement of myxoid cyst from  dorsal aspect of scapholunate ligament.   OPERATING SURGEON:  Katy Fitch. Sypher, MD   ASSISTANT:  Marveen Reeks Dasnoit, PAC   ANESTHESIA:  General by LMA.   SUPERVISING ANESTHESIOLOGIST:  Quita Skye. Krista Blue, MD   INDICATIONS:  Sherry Chapman is a 55 year old woman referred for evaluation  and management of an uncomfortable mass on the dorsal aspect of her left  wrist.  She had a history of developing this mass several years prior  and had tolerated it for a lengthy period of time.  She was developing  increasing discomfort aggravated by weightbearing in a palm down  position.  She sought an upper extremity orthopedic consult requesting  correction.  Preoperatively, she was advised of the poorly understood  biology of myxoid cyst.  This appears to be degenerative process of the  ligament.  She understands the debridement is usually successful;  however, patients who develop myxoid cyst can develop further cyst in  the future.   After informed consent, she is brought to the operating room at this  time.   PROCEDURE IN DETAIL:  [name] is brought to the operating room and  placed in supine position on operating table.   Following the induction general anesthesia by LMA technique, the left  arm was prepped with Betadine soap and solution and sterilely draped.  A  pneumatic tourniquet was applied to the proximal left brachium.  Following exsanguination of the left arm with Esmarch bandage, arterial  tourniquet was inflated to 230 mmHg.  Procedure commenced with a short  transverse incision directly over the mass.  The subcutaneous tissues  were carefully divided revealing an extensor retinaculum that was  bulging.  The retinaculum was gently retracted proximally followed by  retraction of the radial wrist extensors to the radial aspect of the  wrist capsule and the finger extensors to the ulnar aspect.  The cyst  was circumferentially dissected and followed directly to the  scapholunate ligament.  A wrist arthrotomy was accomplished and the  cyst, its contents, and the superficial fibers of scapholunate ligament  were all debrided with rongeur dissection.   Bipolar forceps were used to hit the dorsal fibers of the scapholunate  ligament.  Bleeding points were electrocauterized.  The wound was then  irrigated and repaired with subcutaneous suture of 4-0 Vicryl and  intradermal 2-0 Prolene for skin closure.  There were no apparent  complications.   Ms. Suppa was placed in a compressive dressing with  a volar plaster  splint.   For aftercare, she is discharged with a prescription for Tylenol with  Codeine #3 one tablet p.o. q.4-6 hours p.r.n. pain.  We will see her  back in followup in 1 week for dressing change, suture removal, and  advancement to an excise program.      Katy Fitch. Sypher, M.D.  Electronically Signed     RVS/MEDQ  D:  06/05/2008  T:  06/05/2008  Job:  [id number]   cc:   Jeannett Senior A. Clent Ridges, MD

## 2010-08-26 NOTE — Op Note (Signed)
NAMEHANAAN, GANCARZ                 ACCOUNT NO.:  [id number]   MEDICAL RECORD NO.:  [id number]          PATIENT TYPE:  AMB   LOCATION:  SDC                           FACILITY:  [facility]   PHYSICIAN:  Ruthven B. Earlene Plater, M.D.  DATE OF BIRTH:  Nov 19, 1955   DATE OF PROCEDURE:  03/22/2004  DATE OF DISCHARGE:                                 OPERATIVE REPORT   PREOPERATIVE DIAGNOSES:  Seven week intrauterine pregnancy, desires  termination.   POSTOPERATIVE DIAGNOSES:  Seven week intrauterine pregnancy, desires  termination.   PROCEDURE:  Suction curettage.   SURGEON:  Chester Holstein. Earlene Plater, M.D.   ANESTHESIA:  MAC and 20 mL 1% Nesacaine paracervical block.   SPECIMENS:  Products of conception.   COMPLICATIONS:  None.   INDICATIONS FOR PROCEDURE:  A 55 year old white female with a unintentional  pregnancy presents for elective termination.  The operative risks were  discussed including infection, bleeding, uterine perforation, and damage to  surrounding organs.  The patient declined an offer for tubal ligation at the  same time as her current surgery.   DESCRIPTION OF PROCEDURE:  The patient was taken to the operating room and  MAC anesthesia obtained. She was prepped and draped in the standard fashion  and her bladder emptied with a red rubber catheter.  Examination under  anesthesia showed a 7-8 week size anteverted uterus, no adnexal masses.   Speculum inserted, paracervical block placed, single tooth tenaculum  attached to the anterior lip of the cervix.   The cervix was then easily dilated to a #21. The #7 curved cannula was  inserted with suction on.  Several passes were made to clear the uterus. The  endometrium was curetted and one area still felt like there was some tissue  present anteriorly.  Ultrasound showed there was some residual tissue. This  was cleared with curettage and suctioned. Ultrasound then confirmed complete  uterine evacuation. The single tooth was removed and cervix  was hemostatic.   The patient tolerated the procedure well, there were no complications. She  was taken to the recovery room awake, alert in stable condition.     Wesl  WBD/MEDQ  D:  03/22/2004  T:  03/22/2004  Job:  [id number]

## 2010-08-26 NOTE — Assessment & Plan Note (Signed)
[facility] OFFICE NOTE   VALERI, SULA                          MRN:          [id number]  DATE:07/20/2006                            DOB:          [birth year]    This is a 55 year old woman here for a nongynecological physical  examination. In general she is doing well and has no particular  complaints. Her moods are stable and she thinks her blood pressure has  been stable as well. She saw Dr. Ilda Mori recently for a full GYN  examination. She talked to him about some hot flashes at that point and  he tried her on Mircette. She took this 5 days and felt worse and then  she stopped. At this point, she does not wish any type of hormone  replacement therapy.   For details of her past medical history, family history, social history,  habits, etc., I refer you to her last physical note dated June 06, 2005.   ALLERGIES:  BIAXIN.   CURRENT MEDICATIONS:  1. Inderal LA 80 mg per day.  2. Maxzide 75/50 once a day.  3. Norvasc 5 mg once a day.  4. Clonazepam 1 mg b.i.d.   OBJECTIVE:  VITAL SIGNS:  Height 5 foot 5 inches, weight 195, blood  pressure 144/92, pulse 76 and regular.  GENERAL:  She remains overweight.  SKIN:  Clear.  HEENT:  Eyes are clear. She wears glasses. Ears are clear. Pharynx  clear.  NECK:  Supple without lymphadenopathy or masses.  LUNGS:  Clear.  CARDIAC:  Rate and rhythm regular without gallops, murmurs or rubs.  Distal pulses full. EKG is within normal limits.  ABDOMEN:  Soft, normal bowel sounds, nontender, no masses.  EXTREMITIES:  No clubbing, cyanosis or edema.  NEUROLOGIC:  Grossly intact.   She was here for fasting labs on April 4. These were significant for a  low potassium at 3.1 and elevated fasting glucose to 115. Also an  adequate HDL of 40 with a slightly elevated LDL at 110.   ASSESSMENT/PLAN:  1. Complete physical. We talked about increasing exercise and  losing      weight.  2. Hyperglycemia. Again increasing exercise and losing weight is      imperative. Will check this again in a year.  3. Hypertension, stable.  4. Hyperlipidemia. I think dietary changes and exercise will take care      of this without      medications.  5. Hypokalemia. Will begin K-Dur 10 mEq once a day.  6. Anxiety. I refilled clonazepam as above for 6 months.     Tera Mater. Clent Ridges, MD  Electronically Signed    SAF/MedQ  DD: 07/20/2006  DT: 07/20/2006  Job #: [id number]

## 2010-10-13 ENCOUNTER — Other Ambulatory Visit: Payer: Self-pay | Admitting: Family Medicine

## 2010-10-20 ENCOUNTER — Other Ambulatory Visit (INDEPENDENT_AMBULATORY_CARE_PROVIDER_SITE_OTHER): Payer: 59

## 2010-10-20 DIAGNOSIS — Z0000 Encounter for general adult medical examination without abnormal findings: Secondary | ICD-10-CM

## 2010-10-20 LAB — LIPID PANEL
Cholesterol: 159 mg/dL (ref 0–200)
HDL: 54.1 mg/dL (ref >39.00)
Triglycerides: 104 mg/dL (ref 0.0–149.0)
VLDL: 20.8 mg/dL (ref 0.0–40.0)

## 2010-10-20 LAB — POCT URINALYSIS DIPSTICK
Blood, UA: NEGATIVE
Nitrite, UA: NEGATIVE
Protein, UA: NEGATIVE
pH, UA: 6.5

## 2010-10-20 LAB — HEPATIC FUNCTION PANEL
ALT: 22 U/L (ref 0–35)
Albumin: 4.4 g/dL (ref 3.5–5.2)
Total Protein: 7.5 g/dL (ref 6.0–8.3)

## 2010-10-20 LAB — CBC WITH DIFFERENTIAL/PLATELET
Basophils Absolute: 0 10*3/uL (ref 0.0–0.1)
Hemoglobin: 13.4 g/dL (ref 12.0–15.0)
Lymphocytes Relative: 28 % (ref 12.0–46.0)
Monocytes Relative: 6.2 % (ref 3.0–12.0)
Neutro Abs: 3.4 10*3/uL (ref 1.4–7.7)
Neutrophils Relative %: 63.7 % (ref 43.0–77.0)
Platelets: 269 10*3/uL (ref 150.0–400.0)
RDW: 13.5 % (ref 11.5–14.6)

## 2010-10-20 LAB — TSH: TSH: 2.69 u[IU]/mL (ref 0.35–5.50)

## 2010-10-20 LAB — BASIC METABOLIC PANEL
Calcium: 9.1 mg/dL (ref 8.4–10.5)
GFR: 71.09 mL/min (ref >60.00)
Glucose, Bld: 112 mg/dL — ABNORMAL HIGH (ref 70–99)
Sodium: 139 mEq/L (ref 135–145)

## 2010-10-24 ENCOUNTER — Encounter: Payer: Self-pay | Admitting: Family Medicine

## 2010-10-24 ENCOUNTER — Ambulatory Visit (INDEPENDENT_AMBULATORY_CARE_PROVIDER_SITE_OTHER): Payer: 59 | Admitting: Family Medicine

## 2010-10-24 VITALS — BP 122/82 | HR 68 | Temp 97.8°F | Ht 65.0 in | Wt 197.0 lb

## 2010-10-24 DIAGNOSIS — Z0000 Encounter for general adult medical examination without abnormal findings: Secondary | ICD-10-CM

## 2010-10-24 MED ORDER — AMLODIPINE BESYLATE 5 MG PO TABS: 5.0000 mg | ORAL_TABLET | Freq: Every day | ORAL | Status: DC

## 2010-10-24 MED ORDER — NITROFURANTOIN MONOHYD MACRO 100 MG PO CAPS: 100.0000 mg | ORAL_CAPSULE | Freq: Two times a day (BID) | ORAL | Status: AC

## 2010-10-24 NOTE — Progress Notes (Signed)
  Subjective:    Patient ID: Sherry Chapman, female    DOB: 1955-12-02, 55 y.o.   MRN: [id number]  HPI 55 yr old female for a cpx. She feels well and has no concerns.    Review of Systems  Constitutional: Negative.   HENT: Negative.   Eyes: Negative.   Respiratory: Negative.   Cardiovascular: Negative.   Gastrointestinal: Negative.   Genitourinary: Negative for dysuria, urgency, frequency, hematuria, flank pain, decreased urine volume, enuresis, difficulty urinating, pelvic pain and dyspareunia.  Musculoskeletal: Negative.   Skin: Negative.   Neurological: Negative.   Hematological: Negative.   Psychiatric/Behavioral: Negative.        Objective:   Physical Exam  Constitutional: She is oriented to person, place, and time. She appears well-developed and well-nourished. No distress.  HENT:  Head: Normocephalic and atraumatic.  Right Ear: External ear normal.  Left Ear: External ear normal.  Nose: Nose normal.  Mouth/Throat: Oropharynx is clear and moist. No oropharyngeal exudate.  Eyes: Conjunctivae and EOM are normal. Pupils are equal, round, and reactive to light. No scleral icterus.  Neck: Normal range of motion. Neck supple. No JVD present. No thyromegaly present.  Cardiovascular: Normal rate, regular rhythm, normal heart sounds and intact distal pulses.  Exam reveals no gallop and no friction rub.   No murmur heard.      EKG normal   Pulmonary/Chest: Effort normal and breath sounds normal. No respiratory distress. She has no wheezes. She has no rales. She exhibits no tenderness.  Abdominal: Soft. Bowel sounds are normal. She exhibits no distension and no mass. There is no tenderness. There is no rebound and no guarding.  Musculoskeletal: Normal range of motion. She exhibits no edema and no tenderness.  Lymphadenopathy:    She has no cervical adenopathy.  Neurological: She is alert and oriented to person, place, and time. She has normal reflexes. No cranial nerve deficit. She  exhibits normal muscle tone. Coordination normal.  Skin: Skin is warm and dry. No rash noted. No erythema.  Psychiatric: She has a normal mood and affect. Her behavior is normal. Judgment and thought content normal.          Assessment & Plan:  Treat the UTI. Exercise and lose some weight.

## 2010-11-16 ENCOUNTER — Other Ambulatory Visit: Payer: Self-pay | Admitting: Family Medicine

## 2010-11-25 ENCOUNTER — Telehealth: Payer: Self-pay | Admitting: Family Medicine

## 2010-11-25 ENCOUNTER — Other Ambulatory Visit: Payer: 59

## 2010-11-25 DIAGNOSIS — N390 Urinary tract infection, site not specified: Secondary | ICD-10-CM

## 2010-11-25 LAB — POCT URINALYSIS DIPSTICK
Bilirubin, UA: NEGATIVE
Blood, UA: NEGATIVE
Glucose, UA: NEGATIVE
Ketones, UA: NEGATIVE
Leukocytes, UA: NEGATIVE
Nitrite, UA: NEGATIVE
Spec Grav, UA: 1.03
Urobilinogen, UA: 0.2
pH, UA: 5.5

## 2010-11-25 NOTE — Telephone Encounter (Signed)
Pt here for a UA recheck only.

## 2010-11-28 ENCOUNTER — Telehealth: Payer: Self-pay | Admitting: *Deleted

## 2010-11-28 NOTE — Telephone Encounter (Signed)
Pt. Needs UA results, and wants to know she should take Pertussis.  Macrobid gives her diarrhea.

## 2010-11-29 ENCOUNTER — Telehealth: Payer: Self-pay | Admitting: *Deleted

## 2010-11-29 NOTE — Telephone Encounter (Signed)
Her urine is clear, so no further treatment is needed. Probably a good idea to get a TDaP sometime

## 2010-11-29 NOTE — Telephone Encounter (Signed)
Left voice message with normal results and see below note.

## 2010-11-29 NOTE — Telephone Encounter (Signed)
requesting ua results

## 2010-11-30 NOTE — Telephone Encounter (Signed)
This was normal

## 2010-11-30 NOTE — Telephone Encounter (Signed)
Left message on machine normal

## 2011-01-04 ENCOUNTER — Other Ambulatory Visit: Payer: Self-pay | Admitting: Family Medicine

## 2011-01-30 ENCOUNTER — Telehealth: Payer: Self-pay | Admitting: *Deleted

## 2011-01-30 NOTE — Telephone Encounter (Signed)
Patient states that she is going through [employer] for weight loss at this time and plans to be on this method for quite some time. Her concerns stem from her sister who did a weight loss plan and had hypotension causing side effects of dizziness, etc. Patient would like to know if she should be seen at office for BP checks or if Ok to do at home. Patient advised to continue to check at home but if she should develop any symptoms and/or readings of hypotension to call office for appointment.

## 2011-02-06 ENCOUNTER — Ambulatory Visit (INDEPENDENT_AMBULATORY_CARE_PROVIDER_SITE_OTHER): Payer: 59 | Admitting: Family Medicine

## 2011-02-06 ENCOUNTER — Encounter: Payer: Self-pay | Admitting: Family Medicine

## 2011-02-06 VITALS — BP 128/82 | HR 80 | Temp 98.0°F | Wt 184.0 lb

## 2011-02-06 DIAGNOSIS — M461 Sacroiliitis, not elsewhere classified: Secondary | ICD-10-CM

## 2011-02-06 MED ORDER — ETODOLAC 500 MG PO TABS: 500.0000 mg | ORAL_TABLET | Freq: Two times a day (BID) | ORAL | Status: DC

## 2011-02-06 NOTE — Progress Notes (Signed)
  Subjective:    Patient ID: Sherry Chapman, female    DOB: 06-Jan-1956, 55 y.o.   MRN: [id number]  HPI Here for 3 months of intermittent dull aches in the left lower back. These feel better with Motrin or heat. She thinks they may be related to sleeping on a soft mattress. No radiation of pain into the legs.    Review of Systems  Constitutional: Negative.   Gastrointestinal: Negative.   Genitourinary: Negative.   Musculoskeletal: Positive for back pain.       Objective:   Physical Exam  Constitutional: She appears well-developed and well-nourished.  Musculoskeletal:       Tender over the left sacroiliac joint. Full ROM of the hips and spineu          Assessment & Plan:  Try Etodolac bid for one month and then prn after that

## 2011-02-08 ENCOUNTER — Telehealth: Payer: Self-pay | Admitting: *Deleted

## 2011-02-08 MED ORDER — RANITIDINE HCL 150 MG PO TABS: 150.0000 mg | ORAL_TABLET | Freq: Every day | ORAL | Status: DC

## 2011-02-08 NOTE — Telephone Encounter (Signed)
Notified pt. 

## 2011-02-08 NOTE — Telephone Encounter (Signed)
Try taking Zantac daily

## 2011-02-08 NOTE — Telephone Encounter (Signed)
Pt took Advil before she got her Lodine filled and is now having epigastric pain.  Asking for suggestions.

## 2011-03-06 ENCOUNTER — Telehealth: Payer: Self-pay | Admitting: Family Medicine

## 2011-03-06 NOTE — Telephone Encounter (Signed)
Pulled from Triage vmail. Pt was in to see you 3-4wks ago for sacral/iliac pin & inflammation. Has been taking Todelac, but not much better - still flaring up. Would like your advisement - referral? Appt to see you? Thanks.

## 2011-03-07 NOTE — Telephone Encounter (Signed)
Have her come in to see me again

## 2011-03-07 NOTE — Telephone Encounter (Signed)
Called pt- LMOM 

## 2011-03-08 ENCOUNTER — Ambulatory Visit (INDEPENDENT_AMBULATORY_CARE_PROVIDER_SITE_OTHER): Payer: 59 | Admitting: Family Medicine

## 2011-03-08 ENCOUNTER — Encounter: Payer: Self-pay | Admitting: Family Medicine

## 2011-03-08 VITALS — BP 128/78 | HR 70 | Temp 98.0°F | Wt 179.0 lb

## 2011-03-08 DIAGNOSIS — M545 Low back pain: Secondary | ICD-10-CM

## 2011-03-08 NOTE — Telephone Encounter (Signed)
Pt called back and has been sch for ov today 03/08/11 at 3:45pm as noted.

## 2011-03-08 NOTE — Telephone Encounter (Signed)
Patient calling back.   °

## 2011-03-08 NOTE — Progress Notes (Signed)
  Subjective:    Patient ID: Shraddha Lebron, female    DOB: [birth year], 55 y.o.   MRN: [id number]  HPI Here for a flare of low back pain. She had been doing well on Etodolac until this past weekend. She scrubbed out a bathtub, did a lot of housecleaning, and moved some furniture. Then the next morning she had a lot of pain and stiffness in the lower back. Today she feels much better. Using Flexeril also.    Review of Systems  Constitutional: Negative.   Musculoskeletal: Positive for back pain.       Objective:   Physical Exam  Constitutional: She appears well-developed and well-nourished.  Musculoskeletal: Normal range of motion. She exhibits no edema and no tenderness.       The back appears normal today           Assessment & Plan:  She clearly over did it this past weekend. She needs to be more careful with her back. Stay on current meds.

## 2011-04-03 ENCOUNTER — Encounter: Payer: Self-pay | Admitting: Family Medicine

## 2011-04-03 ENCOUNTER — Ambulatory Visit (INDEPENDENT_AMBULATORY_CARE_PROVIDER_SITE_OTHER): Payer: 59 | Admitting: Family Medicine

## 2011-04-03 VITALS — BP 120/88 | HR 88 | Temp 98.3°F | Wt 171.0 lb

## 2011-04-03 DIAGNOSIS — J329 Chronic sinusitis, unspecified: Secondary | ICD-10-CM

## 2011-04-03 DIAGNOSIS — R0982 Postnasal drip: Secondary | ICD-10-CM

## 2011-04-03 MED ORDER — AMOXICILLIN-POT CLAVULANATE 875-125 MG PO TABS: 1.0000 | ORAL_TABLET | Freq: Two times a day (BID) | ORAL | Status: AC

## 2011-04-03 NOTE — Progress Notes (Signed)
  Subjective:    Patient ID: Sherry Chapman, female    DOB: 1956-04-02, 55 y.o.   MRN: [id number]  Cough This is a new problem. The current episode started in the past 7 days. The problem has been gradually worsening. The problem occurs constantly. The cough is productive of sputum. Associated symptoms include ear congestion, nasal congestion, rhinorrhea and a sore throat. The treatment provided no relief. Her past medical history is significant for bronchitis.  Sinusitis Associated symptoms include coughing and a sore throat.      Review of Systems  Constitutional: Positive for fatigue.  HENT: Positive for sore throat and rhinorrhea.   Eyes: Negative.   Respiratory: Positive for cough.   Cardiovascular: Negative.   Genitourinary: Negative.   Musculoskeletal: Negative.        Objective:   Physical Exam  Constitutional: She is oriented to person, place, and time. She appears well-developed and well-nourished.  HENT:  Head: Atraumatic.  Right Ear: External ear normal.  Mouth/Throat: Oropharynx is clear and moist.  Eyes: Conjunctivae are normal. Pupils are equal, round, and reactive to light.  Neck: Normal range of motion. Neck supple.  Cardiovascular: Normal rate and normal heart sounds.   Pulmonary/Chest: Effort normal and breath sounds normal.  Abdominal: Soft.  Neurological: She is alert and oriented to person, place, and time.  Skin: Skin is warm and dry.          Assessment & Plan:  Assessment: Acute sinusitis postnasal drip,   Plan: Augmentin 875 one by mouth twice a day x10 days, over-the-counter Claritin or Zyrtec daily. Call if symptoms worsen or persist. Recheck as scheduled or when necessary.

## 2011-04-05 ENCOUNTER — Telehealth: Payer: Self-pay | Admitting: Family Medicine

## 2011-04-05 NOTE — Telephone Encounter (Signed)
Triage Record Num: [id number] Operator: Sherry Chapman Patient Name: Sherry Chapman Call Date & Time: 04/04/2011 3:20:32PM Patient Phone: [phone] PCP: Tera Mater. Clent Ridges Patient Gender: Female PCP Fax : [phone] Patient DOB: 11-Nov-1955 [facility] Name[facility] Reason for Call: ER CALL. Caller: [name]/Patient; PCP: Nelwyn Salisbury.; CB#: [phone]; Call regarding Dx: 04/03/2011 with Bronchitis, started ABX on 12-24, seen by NP. Last night had SOB and Wheezing. Pt is currently not wheezing. Pt requesting Albuterol Inhalor. Consulted w/ Dr Artist Pais, verbal oder for Ventlin or Proair 2 puffs q6 hrs PRN according to Insurance, called into [facility], [place], [phone]. Pharmacist will run Ventilin and ProAir and give Pt cheapest priced product. Protocol(s) Used: Medication Question Calls, No Triage (Adults) Recommended Outcome per Protocol: Provide Information or Advice Only Reason for Outcome: Caller has medication question only and triager answers question Care Advice: ~

## 2011-04-06 ENCOUNTER — Telehealth: Payer: Self-pay | Admitting: *Deleted

## 2011-04-06 NOTE — Telephone Encounter (Signed)
I was asked to triage pt.  She is here with her husband, tearful as she has not slept for 2-3 nights, ongoing cough.  Her lungs and breath sounds are clear.  Pt is on her 4th day of Augumentin and pt reports she called the on-call physician on Christmas Day and was called in an inhaler.  The inhaler is helping some, but still cannot sleep due to cough.  All provider schedules are full.  I suggested we try to get pt a cough syrup.  Pt states she has a cough syrup from last year per Dr Clent Ridges, [place].  Pt has not tried this yet, was not sure if she could take cough syrup with antibiotic and inhaler.  I recommended she use the cough syrup as directed and try to get some sleep.  I told pt I would call her tomorrow to see if she had some relief from cough, she voiced her understanding.

## 2011-04-06 NOTE — Telephone Encounter (Signed)
Pt was seen on Monday not feeling any better, requesting to have a breating treatment.

## 2011-04-07 NOTE — Telephone Encounter (Signed)
Pt calling to update. Pt has been using inhaler but pt is still not sleeping well at night.  Pt states she has occasional spells during the day but night time is worse. Pt has tried the hycodan but it helps the cough but does not help sleeping.  Pt would like an rx to help her sleep at night. Pls advise.  Offered pt an appt and pt denied.

## 2011-04-07 NOTE — Telephone Encounter (Signed)
I called pt again, gave her Dr Charm Rings recommendations.  She tried the hycodan yesterday and it caused her to be more hyper.  She is requesting tussionex for cough.  Pt refused Sat clinic, does not want to come in again for same thing.   Explained Dr fry would be back Monday, he can review notes.

## 2011-04-07 NOTE — Telephone Encounter (Signed)
Use both albuterol and antitussives prior to going to bed ROV  next week if unimproved

## 2011-04-07 NOTE — Telephone Encounter (Signed)
PC made to pt home, no answer.  I did leave a message asking her to update [place] on how she is feeling 9am

## 2011-04-10 NOTE — Telephone Encounter (Signed)
Pt unavailable. Will try back another time

## 2011-04-10 NOTE — Telephone Encounter (Signed)
Call in Tussionex to take one tsp bid prn cough, 240 ml with no rf

## 2011-04-12 MED ORDER — HYDROCOD POLST-CHLORPHEN POLST 10-8 MG/5ML PO LQCR: 5.0000 mL | Freq: Two times a day (BID) | ORAL | Status: DC

## 2011-04-12 NOTE — Telephone Encounter (Signed)
Script called in and pt aware. 

## 2011-04-18 ENCOUNTER — Telehealth: Payer: Self-pay | Admitting: *Deleted

## 2011-04-18 NOTE — Telephone Encounter (Signed)
Pt states she had a sinus infection x 3 weeks.  Was unable to finish an antibiotic (Augmentin) due to nausea and vaginal yeast infection.  Cannot take Biaxin.  Is still having a lot post nasal drainage,and would like Dr. Clent Ridges to prescribe RX for her.  She took Dayquil today.

## 2011-04-18 NOTE — Telephone Encounter (Signed)
This should go to [place] since she saw her for this problem

## 2011-04-19 MED ORDER — FLUCONAZOLE 150 MG PO TABS: 150.0000 mg | ORAL_TABLET | Freq: Once | ORAL | Status: AC

## 2011-04-19 MED ORDER — CEFUROXIME AXETIL 250 MG PO TABS: 250.0000 mg | ORAL_TABLET | Freq: Two times a day (BID) | ORAL | Status: AC

## 2011-04-19 NOTE — Telephone Encounter (Signed)
Change antibiotic to Ceftin 250mg  bid x 7 days. Diflucan 150mg  po x 1. As directed, she should take an antihistamine like claritin or zyrtec to help with drainage.

## 2011-04-19 NOTE — Telephone Encounter (Signed)
Pt aware and verbalized understanding. Rx sent to pharmacy 

## 2011-04-22 ENCOUNTER — Other Ambulatory Visit: Payer: Self-pay | Admitting: Family Medicine

## 2011-04-24 ENCOUNTER — Telehealth: Payer: Self-pay | Admitting: Family Medicine

## 2011-04-24 MED ORDER — PROPRANOLOL HCL ER 80 MG PO CP24: 80.0000 mg | ORAL_CAPSULE | Freq: Every day | ORAL | Status: DC

## 2011-04-24 NOTE — Telephone Encounter (Signed)
Pt requesting a refill on Propranolol 80 mg 24 hr capsule take 1 po qd and a 90 day supply.

## 2011-04-24 NOTE — Telephone Encounter (Signed)
Script was sent e-scribe 

## 2011-05-16 ENCOUNTER — Encounter: Payer: Self-pay | Admitting: Family Medicine

## 2011-05-16 ENCOUNTER — Ambulatory Visit (INDEPENDENT_AMBULATORY_CARE_PROVIDER_SITE_OTHER): Payer: 59 | Admitting: Family Medicine

## 2011-05-16 VITALS — BP 144/82 | HR 74 | Temp 97.9°F | Wt 167.0 lb

## 2011-05-16 DIAGNOSIS — J45901 Unspecified asthma with (acute) exacerbation: Secondary | ICD-10-CM

## 2011-05-16 MED ORDER — MOMETASONE FURO-FORMOTEROL FUM 100-5 MCG/ACT IN AERO: 2.0000 | INHALATION_SPRAY | Freq: Two times a day (BID) | RESPIRATORY_TRACT | Status: DC

## 2011-05-16 MED ORDER — ALBUTEROL SULFATE HFA 108 (90 BASE) MCG/ACT IN AERS: 2.0000 | INHALATION_SPRAY | RESPIRATORY_TRACT | Status: DC | PRN

## 2011-05-16 NOTE — Progress Notes (Signed)
  Subjective:    Patient ID: Sherry Chapman, female    DOB: 05-09-1955, 56 y.o.   MRN: [id number]  HPI Here for continued dry cough and wheezing for the past 6 weeks. No fever or sinus pressure or ST lately. She was given  a course of Augmentin on 04-03-11 and then a course of Ceftin on 04-19-11. Each time she improved somewhat but never stopped coughing. She uses her Ventolin inhaler several times a day. This gives her relief for about 30 minutes.    Review of Systems  Constitutional: Negative.   HENT: Negative.   Eyes: Negative.   Respiratory: Positive for cough and wheezing. Negative for chest tightness.   Cardiovascular: Negative.        Objective:   Physical Exam  Constitutional: She appears well-developed and well-nourished.  HENT:  Right Ear: External ear normal.  Left Ear: External ear normal.  Nose: Nose normal.  Mouth/Throat: Oropharynx is clear and moist. No oropharyngeal exudate.  Eyes: Conjunctivae are normal.  Neck: No thyromegaly present.  Cardiovascular: Normal rate, regular rhythm, normal heart sounds and intact distal pulses.   Pulmonary/Chest: Effort normal and breath sounds normal. No respiratory distress. She has no wheezes. She has no rales. She exhibits no tenderness.  Lymphadenopathy:    She has no cervical adenopathy.          Assessment & Plan:  She seems to have respiratory allergies, maybe even asthma. Given samples to try Dulera bid for one month, then she will follow up. Use Ventolin prn.

## 2011-05-17 ENCOUNTER — Telehealth: Payer: Self-pay | Admitting: Family Medicine

## 2011-05-17 NOTE — Telephone Encounter (Signed)
I tried to call pt and she was not at home.

## 2011-05-17 NOTE — Telephone Encounter (Signed)
I am sure her coughing is not from the [facility]. It has not had a chance to help her yet because she would need to be on it for a week or so first. Tell her to stick with it for several weeks

## 2011-05-17 NOTE — Telephone Encounter (Signed)
Patient was given dulera at a visit on yesterday and patient could not sleep due to coughing and wheezing all night. Please advise and call patient back.

## 2011-05-18 ENCOUNTER — Telehealth: Payer: Self-pay | Admitting: *Deleted

## 2011-05-18 ENCOUNTER — Telehealth: Payer: Self-pay | Admitting: Family Medicine

## 2011-05-18 NOTE — Telephone Encounter (Signed)
I spoke with pt and she this morning and she is upset that I did not give the below information to her son when he answered the call yesterday evening. I explained to her that I did not see the son's name was not on the HIPPA form. She was angry and I did give Dr. Clent Ridges this information and the office manager. The pt is expecting a return phone call from this office. Is her son listed on the form?

## 2011-05-18 NOTE — Telephone Encounter (Signed)
Pt wants Dr. Clent Ridges to recommend a cough medicine that she can take while she is also taking the dulera

## 2011-05-18 NOTE — Telephone Encounter (Signed)
Addon on from previous phone note..   I returned phone call to pt and advised per my conversation with Dr. Clent Ridges that he states she can take any otc cough medicine she prefers.

## 2011-05-18 NOTE — Telephone Encounter (Signed)
We advised her that she can use any type of cough med with [facility].

## 2011-05-18 NOTE — Telephone Encounter (Signed)
[facility] Triage Call Report Triage Record Num: [id number] Operator: Micah Flesher Patient Name: Sherry Chapman Call Date & Time: 05/17/2011 8:55:46PM Patient Phone: [phone] PCP: Tera Mater. Clent Ridges Patient Gender: Female PCP Fax : [phone] Patient DOB: 11-05-1955 [facility] Name[facility] Reason for Call: Caller: [name]/Patient; PCP: Nelwyn Salisbury.; CB#: [phone]; Call regarding Cough/Congestion; Onset 05-14-11. Afebrile. Emergent sxs r/o per Cough protocol. See Provider within 24 hours advised. Care advice given. Protocol(s) Used: Cough - Adult Recommended Outcome per Protocol: See Provider within 24 hours Reason for Outcome: Gradual onset of cough when lying down AND having to sleep on more than one pillow or with head of bed elevated Care Advice: ~ Avoid any activity that produces symptoms until evaluated by provider. ~ Sleep with head elevated on several pillows or in a recliner. Go to ED IMMEDIATELY if developing increased shortness of breath, continuous cough, worsening fatigue, or unable to perform ADLs. ~ Provide for rest periods by spacing activities that require physical exertion. Promote calm, restful environment; anxiety increases breathing difficulty. ~ ~ SYMPTOM / CONDITION MANAGEMENT ~ Call provider if develop sudden weight gain, swelling of feet and/or pain in the abdomen, fatigue or trouble sleeping. 02

## 2011-05-18 NOTE — Telephone Encounter (Signed)
Patient does not have a DPR on file. 05/18/11

## 2011-06-07 ENCOUNTER — Institutional Professional Consult (permissible substitution): Payer: 59 | Admitting: Internal Medicine

## 2011-06-16 ENCOUNTER — Ambulatory Visit: Payer: 59 | Admitting: Family Medicine

## 2011-06-19 ENCOUNTER — Ambulatory Visit (INDEPENDENT_AMBULATORY_CARE_PROVIDER_SITE_OTHER): Payer: 59 | Admitting: Internal Medicine

## 2011-06-19 ENCOUNTER — Telehealth: Payer: Self-pay | Admitting: Internal Medicine

## 2011-06-19 ENCOUNTER — Encounter: Payer: Self-pay | Admitting: Internal Medicine

## 2011-06-19 VITALS — BP 126/80 | HR 70 | Temp 98.0°F | Ht 65.5 in | Wt 162.0 lb

## 2011-06-19 DIAGNOSIS — R05 Cough: Secondary | ICD-10-CM

## 2011-06-19 DIAGNOSIS — R059 Cough, unspecified: Secondary | ICD-10-CM

## 2011-06-19 MED ORDER — TRAMADOL HCL 50 MG PO TABS: ORAL_TABLET | ORAL | Status: AC

## 2011-06-19 MED ORDER — NEBIVOLOL HCL 10 MG PO TABS: 10.0000 mg | ORAL_TABLET | Freq: Every day | ORAL | Status: DC

## 2011-06-19 NOTE — Progress Notes (Signed)
  Subjective:    Patient ID: Sherry Chapman, female    DOB: 07/31/1955  MRN: [id number]  HPI  27 yowf never smoker with h/o sinus problems in the 1980s took allergy shots 1980's then subsequently colds linger in nose but starting in mid 2000s typcially develops cough sev times a year assoc with sinus flares requiring abx and "prednisone but makes her batty" referred to pulmonary by [facility] 06/19/2011 for refractory cough.   06/19/2011 1st pulmonary eval cc cough acute onset with st, sinus drainage started out clear abruptly on Mar 30 2012 > eval by UC  And Dr Clent Ridges and Dr Haroldine Laws with yellow mucus finally better p started on clindomycin by Haroldine Laws so severe gags vomits and urinates - improving slowly prior to OV  And really purpose of this ov is to prevent it from coming back again.  Triggers are always assoc with she perceives to be uri with no seasonal or environmental pattern x "I get what/ when all the kids get it". Albuterol helps very little. Maintained on inderol x decades for hbp  Sleeping ok without nocturnal  or early am exacerbation  of respiratory  c/o's or need for noct saba. Also denies any obvious fluctuation of symptoms with weather or environmental changes or other aggravating or alleviating factors except as outlined above      Review of Systems  Constitutional: Negative for fever, chills and unexpected weight change.  HENT: Positive for ear pain, congestion and sneezing. Negative for nosebleeds, sore throat, rhinorrhea, trouble swallowing, dental problem, voice change, postnasal drip and sinus pressure.   Eyes: Negative for visual disturbance.  Respiratory: Positive for cough. Negative for choking and shortness of breath.   Cardiovascular: Negative for chest pain and leg swelling.  Gastrointestinal: Negative for vomiting, abdominal pain and diarrhea.  Genitourinary: Negative for difficulty urinating.  Musculoskeletal: Negative for arthralgias.  Skin: Negative for rash.  Neurological:  Negative for tremors, syncope and headaches.  Hematological: Does not bruise/bleed easily.       Objective:   Physical Exam  Wt 162 06/19/2011  HEENT: nl dentition, turbinates, and orophanx. Nl external ear canals without cough reflex   NECK :  without JVD/Nodes/TM/ nl carotid upstrokes bilaterally   LUNGS: no acc muscle use, clear to A and P bilaterally without cough on insp or exp maneuvers   CV:  RRR  no s3 or murmur or increase in P2, no edema   ABD:  soft and nontender with nl excursion in the supine position. No bruits or organomegaly, bowel sounds nl  MS:  warm without deformities, calf tenderness, cyanosis or clubbing  SKIN: warm and dry without lesions    NEURO:  alert, approp, no deficits   cxr 07/26/10  No acute cardiopulmonary disease  UC cxr from 05/2011 ok     Assessment & Plan:

## 2011-06-19 NOTE — Telephone Encounter (Signed)
LMTCB with man of house.

## 2011-06-19 NOTE — Patient Instructions (Addendum)
Stop inderal and stop bystolic 10 mg one daily instead  See Reflux flyer  Try zegrid   Take 30-60 min before first meal of the day and  one bedtime until cough is completely gone for at least a week without the need for cough suppression  Take mucinex dm up to 1200 mg  every 12 hours and supplement if needed with  tramadol 50 mg up to 2 every 4 hours to suppress the urge to cough. Swallowing water or using ice chips/non mint and menthol containing candies (such as lifesavers or sugarless jolly ranchers) are also effective.  You should rest your voice and avoid activities that you know make you cough.  Once you have eliminated the cough for 3 straight days try reducing the tramadol first,  then the mucinex dm as tolerated.    GERD (REFLUX)  is an extremely common cause of respiratory symptoms, many times with no significant heartburn at all.    It can be treated with medication, but also with lifestyle changes including avoidance of late meals, excessive alcohol, smoking cessation, and avoid fatty foods, chocolate, peppermint, colas, red wine, and acidic juices such as orange juice.  NO MINT OR MENTHOL PRODUCTS SO NO COUGH DROPS  USE SUGARLESS CANDY INSTEAD (jolley ranchers or Stover's)  NO OIL BASED VITAMINS - use powdered substitutes.     If you are satisfied with your treatment plan let your doctor know and he/she can either refill your medications or you can return here when your prescription runs out.     If in any way you are not 100% satisfied,  please tell us.  If 100% better, tell your friends!

## 2011-06-19 NOTE — Telephone Encounter (Signed)
Don't take primary care patients any more but I do see patients without referral from primary care for any respiratory complaints that need to be addressed

## 2011-06-19 NOTE — Telephone Encounter (Signed)
Left message that I am unsure if MW is taking primary patients but will send to get approval/denial from him directly. If any questions or concerns in the meantime then call the office.

## 2011-06-20 DIAGNOSIS — R05 Cough: Secondary | ICD-10-CM | POA: Insufficient documentation

## 2011-06-20 NOTE — Telephone Encounter (Signed)
Called and spoke with pt. Informed her of MW's response/ recs.  Pt declined having any respiratory symptoms.  Nothing further needed.  Will close message.

## 2011-06-20 NOTE — Assessment & Plan Note (Signed)
The most common causes of chronic cough in immunocompetent adults include the following: upper airway cough syndrome (UACS), previously referred to as postnasal drip syndrome (PNDS), which is caused by variety of rhinosinus conditions; (2) asthma; (3) GERD; (4) chronic bronchitis from cigarette smoking or other inhaled environmental irritants; (5) nonasthmatic eosinophilic bronchitis; and (6) bronchiectasis.   These conditions, singly or in combination, have accounted for up to 94% of the causes of chronic cough in prospective studies.   Other conditions have constituted no >6% of the causes in prospective studies These have included bronchogenic carcinoma, chronic interstitial pneumonia, sarcoidosis, left ventricular failure, ACEI-induced cough, and aspiration from a condition associated with pharyngeal dysfunction.   .Chronic cough is often simultaneously caused by more than one condition. A single cause has been found from 38 to 82% of the time, multiple causes from 18 to 62%. Multiply caused cough has been the result of three diseases up to 42% of the time.    This is most likely a form of  Upper airway cough syndrome, so named because it's frequently impossible to sort out how much is  CR/sinusitis with freq throat clearing (which can be related to primary GERD)   vs  causing  secondary (" extra esophageal")  GERD from wide swings in gastric pressure that occur with throat clearing, often  promoting self use of mint and menthol lozenges that reduce the lower esophageal sphincter tone and exacerbate the problem further in a cyclical fashion.   These are the same pts (now being labeled as having "irritable larynx syndrome" by some cough centers) who not infrequently have a history of having failed to tolerate ace inhibitors,  dry powder inhalers or biphosphonates or report having atypical reflux symptoms that don't respond to standard doses of PPI , and are easily confused as having aecopd or asthma  flares by even experienced allergists/ pulmonologists.   rec heavy acid reflux suppression and mucinex dm at onset and rx sinus dz by Dr Haroldine Laws with f/u here if not 100% satisfied

## 2011-06-22 ENCOUNTER — Telehealth: Payer: Self-pay | Admitting: *Deleted

## 2011-06-22 NOTE — Telephone Encounter (Signed)
Pt called with a FYI for Dr. Clent Ridges?  She saw Dr. Sherene Sires for her lung issues, and he took her off Inderal and put her on Bystolic.  She is concerned that since she lost the  45 lbs, that this will be ok, and not lower her BP too much.  Yesterday her BP was 110/70 without taking the Norvasc with the Bystolic.   Advised her to monitor, let Dr. Sherene Sires know what is happening and I will make Dr. Clent Ridges aware also.  She is just a little concerned and wanted Dr. Clent Ridges to know...reallly has no questions for him?

## 2011-06-22 NOTE — Telephone Encounter (Signed)
noted 

## 2011-07-12 ENCOUNTER — Encounter: Payer: Self-pay | Admitting: Gynecology

## 2011-07-12 ENCOUNTER — Other Ambulatory Visit (HOSPITAL_COMMUNITY)
Admission: RE | Admit: 2011-07-12 | Discharge: 2011-07-12 | Disposition: A | Discharge status: Home / Self Care | Payer: 59 | Source: Ambulatory Visit | Attending: Gynecology | Admitting: Gynecology

## 2011-07-12 ENCOUNTER — Ambulatory Visit (INDEPENDENT_AMBULATORY_CARE_PROVIDER_SITE_OTHER): Payer: 59 | Admitting: Gynecology

## 2011-07-12 VITALS — BP 132/88 | Ht 65.0 in | Wt 156.0 lb

## 2011-07-12 DIAGNOSIS — N951 Menopausal and female climacteric states: Secondary | ICD-10-CM

## 2011-07-12 DIAGNOSIS — Z01419 Encounter for gynecological examination (general) (routine) without abnormal findings: Secondary | ICD-10-CM | POA: Insufficient documentation

## 2011-07-12 DIAGNOSIS — Z780 Asymptomatic menopausal state: Secondary | ICD-10-CM

## 2011-07-12 NOTE — Progress Notes (Signed)
Sherry Chapman Aug 31, 1955 [id number]        56 y.o.  for annual exam.  Several issues noted below.  Past medical history,surgical history, medications, allergies, family history and social history were all reviewed and documented in the EPIC chart. ROS:  Was performed and pertinent positives and negatives are included in the history.  Exam: Sherry Chapman chaperone present Filed Vitals:   07/12/11 1420  BP: 132/88   General appearance  Normal Skin grossly normal Head/Neck normal with no cervical or supraclavicular adenopathy thyroid normal Lungs  clear Cardiac RR, without RMG Abdominal  soft, nontender, without masses, organomegaly or hernia Breasts  examined lying and sitting without masses, retractions, discharge or axillary adenopathy. Pelvic  Ext/BUS/vagina  normal with mild atrophic changes  Cervix  normal  Pap done  Uterus  anteverted, normal size, shape and contour, midline and mobile nontender   Adnexa  Without masses or tenderness    Anus and perineum  normal   Rectovaginal  normal sphincter tone without palpated masses or tenderness.    Assessment/Plan:  56 y.o. female for annual exam.    1. Postmenopausal with mild hot flushes. I reviewed options with her she's not interested in medication. I did suggest trial of OTC soy and she can then try this. 2. Breast health.  Patient was not sure if she felt lumps in both breasts. She has lost a lot of weight and felt her breasts felt a little more lumpy.  Exam today is normal lying and sitting and I reassured her and she did not demonstrate any specific areas of concern. She is due for her mammogram now she plans to go ahead and schedule this. SBE reviewed and encouraged. She will follow up if she feels any persistent abnormalities. 3. Pap smear.  I did a Pap smear today as I only have one other in her chart. After several normals we'll go to less frequent screening interval per current guidelines as she has no history of abnormal Pap smears in  the past. 4. Colonoscopy 2010. She is up-to-date with screening colonoscopy and will follow up at the recommended interval. 5. Bone density. She never schedule a bone density as discussed before.  She will go ahead and schedule one now. Increase calcium vitamin D reviewed. 6. Health maintenance. No blood work was done today as this all done through her primary physician's office who follows her for her medical issues. Assuming she continues well from a gynecologic standpoint she will see me in a year, sooner as needed    Dara Lords MD, 3:42 PM 07/12/2011

## 2011-07-12 NOTE — Patient Instructions (Signed)
Follow up in one year for annual gynecologic exam. 

## 2011-07-13 LAB — URINALYSIS W MICROSCOPIC + REFLEX CULTURE
Glucose, UA: NEGATIVE mg/dL
Protein, ur: NEGATIVE mg/dL
Urobilinogen, UA: 0.2 mg/dL (ref 0.0–1.0)

## 2011-07-20 ENCOUNTER — Telehealth: Payer: Self-pay | Admitting: Internal Medicine

## 2011-07-20 NOTE — Telephone Encounter (Signed)
At this point I rec she go back on the propranolol and see what difference if any it makes in her cough - if clearly worse on propranolol she'll need to see her primary for alternatives that will address her jitters but not make her cough.  If no change let us know either way so we can document it for future reference.

## 2011-07-20 NOTE — Telephone Encounter (Signed)
Spoke with pt and notified of recs per MW. She verbalized understanding. Will let us know how she does going back on propranolol.

## 2011-07-20 NOTE — Telephone Encounter (Signed)
Called and spoke with pt and she stated that MW  Took her off the propanolol and change this to bystolic.  She has been on this for about 4 weeks and she is feeling on edge, jittery and a little anxious. Pt has tried taking benadryl at night to help but this has not worked for her.  She also stated that she is getting up about 4-5 times per night to void.  MW not in the office today.  Will forward to doc of the day for recs.  Please advise. Thanks

## 2011-07-20 NOTE — Telephone Encounter (Signed)
Per CY-this needs to be addressed by MW on his return.

## 2011-07-28 ENCOUNTER — Telehealth: Payer: Self-pay | Admitting: *Deleted

## 2011-07-28 NOTE — Telephone Encounter (Signed)
Pt if calling to follow up with last office visit 07/12/11 pt states that the breast lump feels somewhat larger. Pt uses the mobile unit at her job to have mammograms done, and per the guidelines if any lump or abnormalities pt will need Diag. Mammogram. Pt would like to know what you would like her to do? Please advise

## 2011-07-28 NOTE — Telephone Encounter (Signed)
Pt informed with the below note, transferred to appointment desk 

## 2011-07-28 NOTE — Telephone Encounter (Signed)
Recommend office visit for reexamination. I know I just checked her but if she feels these areas are becoming more prominent, I want to recheck her to see if I can feel the areas. We will then discuss the next step which will probably be ultrasound and diagnostic mammography.

## 2011-08-01 ENCOUNTER — Ambulatory Visit: Payer: 59 | Admitting: Gynecology

## 2011-08-08 ENCOUNTER — Ambulatory Visit (INDEPENDENT_AMBULATORY_CARE_PROVIDER_SITE_OTHER): Payer: 59 | Admitting: Gynecology

## 2011-08-08 ENCOUNTER — Encounter: Payer: Self-pay | Admitting: Gynecology

## 2011-08-08 DIAGNOSIS — N63 Unspecified lump in unspecified breast: Secondary | ICD-10-CM

## 2011-08-08 DIAGNOSIS — R351 Nocturia: Secondary | ICD-10-CM

## 2011-08-08 DIAGNOSIS — N390 Urinary tract infection, site not specified: Secondary | ICD-10-CM

## 2011-08-08 LAB — URINALYSIS W MICROSCOPIC + REFLEX CULTURE
Casts: NONE SEEN
Glucose, UA: NEGATIVE mg/dL
Ketones, ur: NEGATIVE mg/dL
Protein, ur: NEGATIVE mg/dL
pH: 5.5 (ref 5.0–8.0)

## 2011-08-08 MED ORDER — CIPROFLOXACIN HCL 250 MG PO TABS: 250.0000 mg | ORAL_TABLET | Freq: Two times a day (BID) | ORAL | Status: AC

## 2011-08-08 NOTE — Progress Notes (Signed)
Patient presents with 2 issues. She still notes an area of lumps in her right breast she wants me to examine. She has not had her mammogram yet. Also notes nocturia where she will go multiple times in the evening although this seems to be since starting Bystolic medication.  Exam with Sherry Chapman chaperone present Both breast examined lying and sitting. Left without masses retractions discharge adenopathy. Right with a ridge of breast tissue palpated 12:00 position from superior to inferior. No overlying skin changes nipple discharge axillary adenopathy. Spine straight no CVA tenderness Abdomen soft nontender without masses guarding rebound organomegaly.  Assessment and plan: 1. Nocturia. Her UA is consistent with UTI. We'll treat with ciprofloxacin 250 mg twice a day x7 days. If her symptoms continue then I may have her follow up with urology that she has seen Dr. Retta Diones in the past. 2. Sherry Chapman of tissue right breast. It feels physiologic but for completeness I would go ahead and schedule ultrasound diagnostic mammogram just to clear this region and she agrees with this and we'll make those arrangements. Assuming everything appears benign she will follow with self breast exams and if this area does not change then we'll follow. Obviously if there is any question then we'll pursue a more aggressive evaluation.

## 2011-08-08 NOTE — Patient Instructions (Signed)
Take antibiotics as prescribed.  Office will call you to schedule mammogram ultrasound.

## 2011-08-09 ENCOUNTER — Other Ambulatory Visit: Payer: Self-pay | Admitting: *Deleted

## 2011-08-09 ENCOUNTER — Telehealth: Payer: Self-pay | Admitting: *Deleted

## 2011-08-09 DIAGNOSIS — N6459 Other signs and symptoms in breast: Secondary | ICD-10-CM

## 2011-08-09 NOTE — Telephone Encounter (Signed)
Message copied by Libby Maw on Wed Aug 09, 2011  3:05 PM ------      Message from: Dara Lords      Created: Tue Aug 08, 2011  3:31 PM       Schedule diagnostic mammogram and ultrasound reference rib ridge of tissue right breast 12:00 area.

## 2011-08-09 NOTE — Telephone Encounter (Signed)
Patient informed appt set up with [facility] on 08/17/11 @ 3pm.

## 2011-08-10 LAB — URINE CULTURE: Colony Count: 60000

## 2011-08-17 ENCOUNTER — Ambulatory Visit
Admission: RE | Admit: 2011-08-17 | Discharge: 2011-08-17 | Disposition: A | Discharge status: Home / Self Care | Payer: 59 | Source: Ambulatory Visit | Attending: Gynecology | Admitting: Gynecology

## 2011-08-17 DIAGNOSIS — N6459 Other signs and symptoms in breast: Secondary | ICD-10-CM

## 2011-08-23 ENCOUNTER — Telehealth: Payer: Self-pay | Admitting: *Deleted

## 2011-08-23 ENCOUNTER — Other Ambulatory Visit: Payer: 59

## 2011-08-23 ENCOUNTER — Other Ambulatory Visit: Payer: Self-pay | Admitting: Gynecology

## 2011-08-23 DIAGNOSIS — N390 Urinary tract infection, site not specified: Secondary | ICD-10-CM

## 2011-08-23 LAB — URINALYSIS W MICROSCOPIC + REFLEX CULTURE
Glucose, UA: NEGATIVE mg/dL
Hgb urine dipstick: NEGATIVE
Protein, ur: NEGATIVE mg/dL
RBC / HPF: NONE SEEN RBC/hpf (ref <3)

## 2011-08-23 NOTE — Telephone Encounter (Signed)
Pt called c/o frequent urination and pelvic pressure that started last night. Pt was given ciprofloxacin 250 mg twice a day x 7 days. Took all medication and symptoms resolved, but started again last night. Pt would like the okay to drop off u/a? Please advise

## 2011-08-23 NOTE — Telephone Encounter (Signed)
She will need a urinalysis with culture and sensitivity

## 2011-08-23 NOTE — Telephone Encounter (Signed)
Pt informed with the below note. 

## 2011-08-25 LAB — URINE CULTURE
Colony Count: NO GROWTH
Organism ID, Bacteria: NO GROWTH

## 2011-09-01 ENCOUNTER — Institutional Professional Consult (permissible substitution): Payer: 59 | Admitting: Internal Medicine

## 2011-09-05 ENCOUNTER — Telehealth: Payer: Self-pay | Admitting: Internal Medicine

## 2011-09-05 MED ORDER — NEBIVOLOL HCL 10 MG PO TABS: 10.0000 mg | ORAL_TABLET | Freq: Every day | ORAL | Status: DC

## 2011-09-05 NOTE — Telephone Encounter (Signed)
Pt seen by MW 3.11.13 for pulm consult for cough.  Inderal changed to bystolic 10mg .  Called spoke with patient who is requesting [facility]ills of the [facility].  Advised patient will be happy to fill this for her for a 90day supply but after that she needs to contact her PCP for further refills.  Pt okay with this and verbalized her understanding.

## 2011-10-02 ENCOUNTER — Other Ambulatory Visit: Payer: Self-pay | Admitting: Family Medicine

## 2011-10-02 NOTE — Telephone Encounter (Signed)
Can we refill this? 

## 2011-10-19 ENCOUNTER — Other Ambulatory Visit (INDEPENDENT_AMBULATORY_CARE_PROVIDER_SITE_OTHER): Payer: 59

## 2011-10-19 DIAGNOSIS — Z0000 Encounter for general adult medical examination without abnormal findings: Secondary | ICD-10-CM

## 2011-10-19 LAB — CBC WITH DIFFERENTIAL/PLATELET
Basophils Absolute: 0 10*3/uL (ref 0.0–0.1)
Basophils Relative: 0.6 % (ref 0.0–3.0)
Eosinophils Absolute: 0.1 10*3/uL (ref 0.0–0.7)
Lymphocytes Relative: 33.7 % (ref 12.0–46.0)
MCHC: 33 g/dL (ref 30.0–36.0)
Neutrophils Relative %: 58.4 % (ref 43.0–77.0)
Platelets: 241 10*3/uL (ref 150.0–400.0)
RBC: 4.53 Mil/uL (ref 3.87–5.11)

## 2011-10-19 LAB — BASIC METABOLIC PANEL
CO2: 30 mEq/L (ref 19–32)
Calcium: 9.4 mg/dL (ref 8.4–10.5)
Creatinine, Ser: 1 mg/dL (ref 0.4–1.2)

## 2011-10-19 LAB — POCT URINALYSIS DIPSTICK
Blood, UA: NEGATIVE
Nitrite, UA: NEGATIVE
Protein, UA: NEGATIVE
Urobilinogen, UA: 0.2
pH, UA: 6

## 2011-10-19 LAB — HEPATIC FUNCTION PANEL
Alkaline Phosphatase: 54 U/L (ref 39–117)
Bilirubin, Direct: 0 mg/dL (ref 0.0–0.3)
Total Bilirubin: 0.6 mg/dL (ref 0.3–1.2)
Total Protein: 7.5 g/dL (ref 6.0–8.3)

## 2011-10-19 LAB — LIPID PANEL
HDL: 48.5 mg/dL (ref >39.00)
Total CHOL/HDL Ratio: 4
Triglycerides: 128 mg/dL (ref 0.0–149.0)
VLDL: 25.6 mg/dL (ref 0.0–40.0)

## 2011-10-23 MED ORDER — NITROFURANTOIN MACROCRYSTAL 100 MG PO CAPS: 100.0000 mg | ORAL_CAPSULE | Freq: Two times a day (BID) | ORAL | Status: DC

## 2011-10-23 MED ORDER — NITROFURANTOIN MONOHYD MACRO 100 MG PO CAPS: 100.0000 mg | ORAL_CAPSULE | Freq: Two times a day (BID) | ORAL | Status: AC

## 2011-10-23 NOTE — Progress Notes (Signed)
Quick Note:  I spoke with pt and sent script e-scribe. ______ 

## 2011-10-23 NOTE — Addendum Note (Signed)
Addended by: Aniceto Boss A on: 10/23/2011 04:19 PM   Modules accepted: Orders

## 2011-10-23 NOTE — Progress Notes (Signed)
Quick Note:  I left voice message for pt to return my call. ______ 

## 2011-10-25 ENCOUNTER — Encounter: Payer: Self-pay | Admitting: Family Medicine

## 2011-10-25 ENCOUNTER — Ambulatory Visit (INDEPENDENT_AMBULATORY_CARE_PROVIDER_SITE_OTHER): Payer: 59 | Admitting: Family Medicine

## 2011-10-25 VITALS — BP 118/70 | HR 68 | Temp 97.9°F | Ht 65.5 in | Wt 151.0 lb

## 2011-10-25 DIAGNOSIS — Z0000 Encounter for general adult medical examination without abnormal findings: Secondary | ICD-10-CM

## 2011-10-25 NOTE — Progress Notes (Signed)
  Subjective:    Patient ID: Sherry Chapman, female    DOB: Aug 22, 1955, 56 y.o.   MRN: [id number]  HPI 56 yr old female for a cpx. She feels good and has no concerns. We discovered some bacteria in her urine and are treating this, but she has no symptoms. She is on a maintenance program through [employer].    Review of Systems  Constitutional: Negative.  Negative for fever, diaphoresis, activity change, appetite change, fatigue and unexpected weight change.  HENT: Negative.  Negative for hearing loss, ear pain, nosebleeds, congestion, sore throat, trouble swallowing, neck pain, neck stiffness, voice change and tinnitus.   Eyes: Negative.  Negative for photophobia, pain, discharge, redness and visual disturbance.  Respiratory: Negative.  Negative for apnea, cough, choking, chest tightness, shortness of breath, wheezing and stridor.   Cardiovascular: Negative.  Negative for chest pain, palpitations and leg swelling.  Gastrointestinal: Negative.  Negative for nausea, vomiting, abdominal pain, diarrhea, constipation, blood in stool, abdominal distention and rectal pain.  Genitourinary: Negative.  Negative for dysuria, urgency, frequency, hematuria, flank pain, decreased urine volume, vaginal bleeding, vaginal discharge, enuresis, difficulty urinating, vaginal pain, menstrual problem, pelvic pain and dyspareunia.  Musculoskeletal: Negative.  Negative for myalgias, back pain, joint swelling, arthralgias and gait problem.  Skin: Negative.  Negative for color change, pallor, rash and wound.  Neurological: Negative.  Negative for dizziness, tremors, seizures, syncope, speech difficulty, weakness, light-headedness, numbness and headaches.  Hematological: Negative.  Negative for adenopathy. Does not bruise/bleed easily.  Psychiatric/Behavioral: Negative.  Negative for hallucinations, behavioral problems, confusion, disturbed wake/sleep cycle, dysphoric mood and agitation. The patient is not nervous/anxious.         Objective:   Physical Exam  Constitutional: She is oriented to person, place, and time. She appears well-developed and well-nourished. No distress.  HENT:  Head: Normocephalic and atraumatic.  Right Ear: External ear normal.  Left Ear: External ear normal.  Nose: Nose normal.  Mouth/Throat: Oropharynx is clear and moist. No oropharyngeal exudate.  Eyes: Conjunctivae and EOM are normal. Pupils are equal, round, and reactive to light. No scleral icterus.  Neck: Normal range of motion. Neck supple. No JVD present. No thyromegaly present.  Cardiovascular: Normal rate, regular rhythm, normal heart sounds and intact distal pulses.  Exam reveals no gallop and no friction rub.   No murmur heard. Pulmonary/Chest: Effort normal and breath sounds normal. No respiratory distress. She has no wheezes. She has no rales. She exhibits no tenderness.  Abdominal: Soft. Bowel sounds are normal. She exhibits no distension and no mass. There is no tenderness. There is no rebound and no guarding.  Musculoskeletal: Normal range of motion. She exhibits no edema and no tenderness.  Lymphadenopathy:    She has no cervical adenopathy.  Neurological: She is alert and oriented to person, place, and time. She has normal reflexes. No cranial nerve deficit. She exhibits normal muscle tone. Coordination normal.  Skin: Skin is warm and dry. No rash noted. No erythema.  Psychiatric: She has a normal mood and affect. Her behavior is normal. Judgment and thought content normal.          Assessment & Plan:  Well exam. Suggested she add fish oil capsules to her daily meds.

## 2011-11-10 ENCOUNTER — Telehealth: Payer: Self-pay | Admitting: Family Medicine

## 2011-11-10 MED ORDER — POTASSIUM CHLORIDE CRYS ER 10 MEQ PO TBCR: 10.0000 meq | EXTENDED_RELEASE_TABLET | Freq: Every day | ORAL | Status: DC

## 2011-11-10 NOTE — Telephone Encounter (Signed)
I sent script e-scribe. 

## 2011-11-10 NOTE — Telephone Encounter (Signed)
Pt requesting refill on potassium chloride (K-DUR,KLOR-CON) 10 MEQ tablet    [facility]

## 2011-12-04 ENCOUNTER — Other Ambulatory Visit: Payer: Self-pay | Admitting: Gynecology

## 2011-12-04 ENCOUNTER — Encounter: Payer: Self-pay | Admitting: Gynecology

## 2011-12-04 ENCOUNTER — Ambulatory Visit (INDEPENDENT_AMBULATORY_CARE_PROVIDER_SITE_OTHER): Payer: 59 | Admitting: Gynecology

## 2011-12-04 DIAGNOSIS — N3289 Other specified disorders of bladder: Secondary | ICD-10-CM

## 2011-12-04 DIAGNOSIS — R3989 Other symptoms and signs involving the genitourinary system: Secondary | ICD-10-CM

## 2011-12-04 LAB — URINALYSIS W MICROSCOPIC + REFLEX CULTURE
Glucose, UA: NEGATIVE mg/dL
Ketones, ur: NEGATIVE mg/dL
Protein, ur: NEGATIVE mg/dL
RBC / HPF: NONE SEEN RBC/hpf (ref <3)
Urobilinogen, UA: 0.2 mg/dL (ref 0.0–1.0)

## 2011-12-04 MED ORDER — SULFAMETHOXAZOLE-TRIMETHOPRIM 800-160 MG PO TABS: 1.0000 | ORAL_TABLET | Freq: Two times a day (BID) | ORAL | Status: AC

## 2011-12-04 NOTE — Progress Notes (Signed)
Patient presents having been treated for a low grade UTI 6 weeks ago. Symptoms cleared but now over the last 24 hours has noticed some suprapubic pressure symptoms with some mild urgency. No dysuria no back pain fever chills or other constitutional symptoms.  Exam with Claudean Severance. Spine straight no CVA tenderness. Abdomen soft nontender without masses guarding rebound organomegaly. Pelvic bimanual without masses or tenderness. Rectovaginal exam is normal.  Assessment and plan: Symptoms suggestive of an early UTI. UA is unremarkable. Will cover with Septra DS one by mouth twice a day x3 days. Check culture. Follow up if symptoms persist or recur.

## 2011-12-04 NOTE — Patient Instructions (Signed)
Take antibiotics as prescribed. Follow up if symptoms persist or recur. 

## 2011-12-05 LAB — URINE CULTURE
Colony Count: 4000
Organism ID, Bacteria: NO GROWTH

## 2011-12-19 ENCOUNTER — Ambulatory Visit (INDEPENDENT_AMBULATORY_CARE_PROVIDER_SITE_OTHER): Payer: 59 | Admitting: Internal Medicine

## 2011-12-19 ENCOUNTER — Encounter: Payer: Self-pay | Admitting: Internal Medicine

## 2011-12-19 VITALS — BP 124/80 | HR 71 | Ht 65.5 in | Wt 155.0 lb

## 2011-12-19 DIAGNOSIS — J069 Acute upper respiratory infection, unspecified: Secondary | ICD-10-CM

## 2011-12-19 MED ORDER — AZITHROMYCIN 250 MG PO TABS: ORAL_TABLET | ORAL | Status: AC

## 2011-12-19 MED ORDER — ALBUTEROL SULFATE HFA 108 (90 BASE) MCG/ACT IN AERS: 2.0000 | INHALATION_SPRAY | RESPIRATORY_TRACT | Status: DC | PRN

## 2011-12-19 NOTE — Progress Notes (Signed)
Subjective:    Patient ID: Sherry Chapman, female    DOB: [birth year]  MRN: [id number]  HPI  53 yowf never smoker with h/o sinus problems in the 1980s took allergy shots 1980's then subsequently colds linger in nose but starting in mid 2000s typcially develops cough sev times a year assoc with sinus flares requiring abx and "prednisone but makes her batty" referred to pulmonary by UC 06/19/2011 for refractory cough.   06/19/2011 1st pulmonary eval cc cough acute onset with st, sinus drainage started out clear abruptly on Mar 30 2012 > eval by UC  And Dr Clent Ridges and Dr Haroldine Laws with yellow mucus finally better p started on clindomycin by Haroldine Laws so severe gags vomits and urinates - improving slowly prior to OV  And really purpose of this ov is to prevent it from coming back again.  Triggers are always assoc with she perceives to be uri with no seasonal or environmental pattern x "I get what/ when all the kids get it". Albuterol helps very little. Maintained on inderol x decades for hbp  Sleeping ok without nocturnal  or early am exacerbation  of respiratory  c/o's or need for noct saba. Also denies any obvious fluctuation of symptoms with weather or environmental changes or other aggravating or alleviating factors except as outlined above     12/19/11- 56 yoF pt of Dr Sherene Sires ACUTE VISIT: MW patient-wheezing and coughing through the night-occasionaly will be yellow in color; was unable to locate her rescue inhaler. Burning in chest in neck/throat area-since Friday. Has used Clorcedin OTC and Zantac as well. Cough, chest rattle and wheeze x4 days. At onset had malaise and aching. History the prednisone caused mental agitation and panic. Dulera made her anxious.  Review of Systems - see HPI Constitutional: Negative for fever, chills and unexpected weight change.  HENT: Negative for ear pain, congestion and sneezing. Negative for nosebleeds, +sore throat, rhinorrhea, trouble swallowing, , +postnasal drip and  sinus pressure.   Eyes: Negative for visual disturbance.  Respiratory: Positive for cough. Negative for choking and shortness of breath.   Cardiovascular: Negative for chest pain and leg swelling.  Gastrointestinal: Negative for vomiting, abdominal pain and diarrhea.  Genitourinary: Negative for difficulty urinating.  Musculoskeletal: Negative for arthralgias.  Skin: Negative for rash.  Neurological: Negative for tremors, syncope and headaches.  Hematological: Does not bruise/bleed easily.   Objective:  OBJ- Physical Exam General- Alert, Oriented, Affect-appropriate, Distress- none acute Skin- rash-none, lesions- none, excoriation- none Lymphadenopathy- none Head- atraumatic            Eyes- Gross vision intact, PERRLA, conjunctivae and secretions clear            Ears- Hearing, canals-normal            Nose- Clear, no-Septal dev, mucus, polyps, erosion, perforation             Throat- Mallampati II , +mucosa red without exudate , drainage- none, tonsils- atrophic Neck- flexible , trachea midline, no stridor , thyroid nl, carotid no bruit Chest - symmetrical excursion , unlabored           Heart/CV- RRR , no murmur , no gallop  , no rub, nl s1 s2                           - JVD- none , edema- none, stasis changes- none, varices- none           Lung- +  raspy, wheeze- none, cough- none , dullness-none, rub- none           Chest wall-  Abd-  Br/ Gen/ Rectal- Not done, not indicated Extrem- cyanosis- none, clubbing, none, atrophy- none, strength- nl Neuro- grossly intact to observation  Assessment & Plan:

## 2011-12-19 NOTE — Patient Instructions (Addendum)
Refill script for rescue albuterol inhaler  Script to hold for antibiotic Zpak to take if infection gets worse  Ok to take your montelukast- one every day  Ok to take your tussionex as instructed on label- twice daily if needed  Ok to take Mucinex DM if needed to calm cough with little sedation  Take Zantac 2 or 3 times daily, or Zegerid once daily  to block stomach acid production

## 2011-12-26 ENCOUNTER — Telehealth: Payer: Self-pay | Admitting: Family Medicine

## 2011-12-26 NOTE — Telephone Encounter (Signed)
Dr. Shona Simpson was filling pt's Bystolic. Pt now wants Dr. Clent Ridges to fill. She uses [facility] mail order. Please advise. Thanks.

## 2011-12-26 NOTE — Assessment & Plan Note (Signed)
Upper respiratory infection with tracheobronchitis. 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 Medrol may be worth a try if necessary, in small doses. Plan-refill rescue inhaler. Mucinex DM. Okay to take Zantac twice a day. Z-Pak to hold. Fluids, comfort measures.

## 2011-12-27 MED ORDER — NEBIVOLOL HCL 10 MG PO TABS: 10.0000 mg | ORAL_TABLET | Freq: Every day | ORAL | Status: DC

## 2011-12-27 NOTE — Telephone Encounter (Signed)
Rx sent to [facility] for year supply.

## 2011-12-27 NOTE — Telephone Encounter (Signed)
Refill for one year 

## 2012-01-16 ENCOUNTER — Other Ambulatory Visit: Payer: Self-pay | Admitting: Family Medicine

## 2012-01-16 NOTE — Telephone Encounter (Signed)
Call in #[phone] with 5 rf 

## 2012-01-18 ENCOUNTER — Ambulatory Visit: Payer: 59 | Admitting: Internal Medicine

## 2012-02-28 ENCOUNTER — Other Ambulatory Visit: Payer: Self-pay | Admitting: Family Medicine

## 2012-03-21 ENCOUNTER — Other Ambulatory Visit: Payer: Self-pay | Admitting: Family Medicine

## 2012-03-22 ENCOUNTER — Ambulatory Visit (INDEPENDENT_AMBULATORY_CARE_PROVIDER_SITE_OTHER): Payer: 59 | Admitting: Women's Health

## 2012-03-22 ENCOUNTER — Encounter: Payer: Self-pay | Admitting: Women's Health

## 2012-03-22 DIAGNOSIS — R350 Frequency of micturition: Secondary | ICD-10-CM

## 2012-03-22 LAB — URINALYSIS W MICROSCOPIC + REFLEX CULTURE
Glucose, UA: NEGATIVE mg/dL
RBC / HPF: NONE SEEN RBC/hpf (ref <3)
Specific Gravity, Urine: >1.03 — ABNORMAL HIGH (ref 1.005–1.030)
Urobilinogen, UA: 0.2 mg/dL (ref 0.0–1.0)

## 2012-03-22 MED ORDER — URIBEL 118 MG PO CAPS: ORAL_CAPSULE | ORAL | Status: DC

## 2012-03-22 NOTE — Progress Notes (Signed)
Patient ID: Sherry Chapman, female   DOB: 06/17/1955, 56 y.o.   MRN: [id number] Presents with complaint of urinary frequency with urgency. Denies any pain at end of stream, states always gets up 3- 4 times per night. Has chronic low lumbar back pain. Denies any vaginal discharge or fever.  Exam: Appears well, no CVAT, pain is more lower lumbar area. External genitalia within normal limits, speculum exam: no discharge, erythema or odor noted. Bimanual no CMT or adnexal fullness or tenderness. UA: Trace leukocytes, trace ketones, 3-6 WBCs, rare bacteria.  Urinary frequency  Plan: Urine culture pending. Samples of Uribel given with prescription and coupon. Reviewed importance of increasing fluids during the day and limiting fluids at night to help prevent nocturia and increase hydration.

## 2012-03-24 LAB — URINE CULTURE

## 2012-04-25 ENCOUNTER — Telehealth: Payer: Self-pay | Admitting: Family Medicine

## 2012-04-25 NOTE — Telephone Encounter (Signed)
Patient Information:  Caller Name: Ifrah  Phone: [phone]  Patient: Sherry Chapman, Sherry Chapman  Gender: Female  DOB: 07-28-1955  Age: 57 Years  PCP: Gershon Crane [facility])  Pregnant: No  Office Follow Up:  Does the office need to follow up with this patient?: No  Instructions For The Office: N/A  RN Note:  Reviewed Sleep Depreviation Protocol per [facility]. Advised to see physician in 72 hours.  Symptoms  Reason For Call & Symptoms: She is having difficulty falling asleep. Onset 3-4 nights.  She normally takes Benadryl  off/on for 6-8  months PRN.  The benadryl is now not working. She does not have any menopausal symptoms. No new changes "Life is great".   She states some night s <4 hours. Diet- normal has been on  Weight Watchers lost 50 lbs.  Water and lemon water only. No caffeine.  Always in bed at same time,  bath TV ritual followed. Works- normal stressors denies any depression or worries.   Reviewed Health History In EMR: Yes  Reviewed Medications In EMR: Yes  Reviewed Allergies In EMR: Yes  Reviewed Surgeries / Procedures: No  Date of Onset of Symptoms: 04/22/2012  Treatments Tried: Benadryl , Left OTC muscle relaxer  Treatments Tried Worked: No OB / GYN:  LMP: Unknown  Guideline(s) Used:  No Protocol Available - Sick Adult  Disposition Per Guideline:   See Within 3 Days in Office  Reason For Disposition Reached:   Nursing judgment  Advice Given:  N/A  Appointment Scheduled:  04/26/2012 13:30:00 Appointment Scheduled Provider:  Gershon Crane [facility])

## 2012-04-26 ENCOUNTER — Ambulatory Visit: Payer: Self-pay | Admitting: Family Medicine

## 2012-06-18 ENCOUNTER — Telehealth: Payer: Self-pay | Admitting: Family Medicine

## 2012-06-18 MED ORDER — POTASSIUM CHLORIDE CRYS ER 10 MEQ PO TBCR: 10.0000 meq | EXTENDED_RELEASE_TABLET | Freq: Every day | ORAL | Status: DC

## 2012-06-18 NOTE — Telephone Encounter (Signed)
I sent script e-scribe. 

## 2012-06-18 NOTE — Telephone Encounter (Signed)
Refill request for Potassium 10 meq and send in a 90 day supply to [facility].

## 2012-06-18 NOTE — Telephone Encounter (Signed)
Okay for one year  

## 2012-09-11 ENCOUNTER — Ambulatory Visit (INDEPENDENT_AMBULATORY_CARE_PROVIDER_SITE_OTHER): Payer: 59 | Admitting: Family Medicine

## 2012-09-11 ENCOUNTER — Telehealth: Payer: Self-pay | Admitting: Family Medicine

## 2012-09-11 ENCOUNTER — Encounter: Payer: Self-pay | Admitting: Family Medicine

## 2012-09-11 VITALS — BP 130/80 | HR 72 | Temp 98.0°F | Wt 156.0 lb

## 2012-09-11 DIAGNOSIS — F411 Generalized anxiety disorder: Secondary | ICD-10-CM

## 2012-09-11 MED ORDER — ESCITALOPRAM OXALATE 10 MG PO TABS: 10.0000 mg | ORAL_TABLET | Freq: Every day | ORAL | Status: DC

## 2012-09-11 MED ORDER — ETODOLAC 500 MG PO TABS: 500.0000 mg | ORAL_TABLET | Freq: Every day | ORAL | Status: DC

## 2012-09-11 NOTE — Progress Notes (Signed)
  Subjective:    Patient ID: Sherry Chapman, female    DOB: [birth year], 56 y.o.   MRN: [id number]  HPI Here to discuss anxiety. Her job is stressful and she finds she worries a lot about things and her temper is quicker than usual. She takes Clonazepam 2 or 3 times a week and this helps.    Review of Systems  Constitutional: Negative.   Psychiatric/Behavioral: Positive for decreased concentration and agitation. The patient is nervous/anxious.        Objective:   Physical Exam  Constitutional: She is oriented to person, place, and time. She appears well-developed and well-nourished.  Neurological: She is alert and oriented to person, place, and time.  Psychiatric: She has a normal mood and affect. Her behavior is normal. Thought content normal.          Assessment & Plan:  Try Lexapro 10 mg daily. Recheck in one month

## 2012-09-11 NOTE — Telephone Encounter (Signed)
Noted.  Appt on 09/11/12

## 2012-09-11 NOTE — Telephone Encounter (Signed)
Rn was calling pt back in reference to earlier call for depression/anxiety.  Pt states she called back to the office and office has scheduled appt for today (09/11/12) at 1500 with Dr Clent Ridges

## 2012-09-18 ENCOUNTER — Encounter: Payer: Self-pay | Admitting: Gynecology

## 2012-09-24 ENCOUNTER — Telehealth: Payer: Self-pay | Admitting: Family Medicine

## 2012-09-24 NOTE — Telephone Encounter (Signed)
I cannot see how this could be related to the Lexapro, but I guess it is possible. Try to stick with it and call us back next week

## 2012-09-24 NOTE — Telephone Encounter (Signed)
I spoke with pt  

## 2012-09-24 NOTE — Telephone Encounter (Signed)
Patient Information:  Caller Name: [name]  Phone: [phone]  Patient: Sherry Chapman, Sherry Chapman  Gender: Female  DOB: [birth year]  Age: 57 Years  PCP: Gershon Crane [facility])  Office Follow Up:  Does the office need to follow up with this patient?: Yes  Instructions For The Office: Please call and advise.   Symptoms  Reason For Call & Symptoms: Pt was started on Lexapro 1 week ago. Pt has began to experience leg twitches at night since taking it. Pt has them nightly x 1 hour - uncontrollable. Pt wants MD to know. Is there anything she can do about this? There is no cramping. The whole leg is involved.  Reviewed Health History In EMR: Yes  Reviewed Medications In EMR: Yes  Reviewed Allergies In EMR: Yes  Reviewed Surgeries / Procedures: Yes  Date of Onset of Symptoms: 09/24/2012  Guideline(s) Used:  No Protocol Available - Information Only  Disposition Per Guideline:   Discuss with PCP and Callback by Nurse Today  Reason For Disposition Reached:   Nursing judgment  Advice Given:  N/A  Patient Will Follow Care Advice:  YES

## 2012-11-01 ENCOUNTER — Telehealth: Payer: Self-pay | Admitting: Family Medicine

## 2012-11-01 MED ORDER — CLONAZEPAM 1 MG PO TABS: 1.0000 mg | ORAL_TABLET | Freq: Two times a day (BID) | ORAL | Status: DC | PRN

## 2012-11-01 NOTE — Telephone Encounter (Signed)
Refill request for Clonazepam 1 mg take 1 po bid prn

## 2012-11-01 NOTE — Telephone Encounter (Signed)
Call in #[phone] with 5 rf 

## 2012-11-01 NOTE — Telephone Encounter (Signed)
I called in script 

## 2012-11-12 ENCOUNTER — Other Ambulatory Visit: Payer: Self-pay

## 2012-11-12 DIAGNOSIS — Z1231 Encounter for screening mammogram for malignant neoplasm of breast: Secondary | ICD-10-CM

## 2012-11-28 ENCOUNTER — Ambulatory Visit: Payer: 59

## 2012-12-04 ENCOUNTER — Encounter: Payer: Self-pay | Admitting: Gynecology

## 2012-12-04 ENCOUNTER — Ambulatory Visit (INDEPENDENT_AMBULATORY_CARE_PROVIDER_SITE_OTHER): Payer: 59 | Admitting: Gynecology

## 2012-12-04 VITALS — BP 114/66 | Ht 65.0 in | Wt 158.0 lb

## 2012-12-04 DIAGNOSIS — N952 Postmenopausal atrophic vaginitis: Secondary | ICD-10-CM

## 2012-12-04 DIAGNOSIS — Z01419 Encounter for gynecological examination (general) (routine) without abnormal findings: Secondary | ICD-10-CM

## 2012-12-04 DIAGNOSIS — N951 Menopausal and female climacteric states: Secondary | ICD-10-CM

## 2012-12-04 MED ORDER — ESTRADIOL 0.5 MG PO TABS: 0.5000 mg | ORAL_TABLET | Freq: Every day | ORAL | Status: DC

## 2012-12-04 MED ORDER — PROGESTERONE MICRONIZED 100 MG PO CAPS: 100.0000 mg | ORAL_CAPSULE | Freq: Every day | ORAL | Status: DC

## 2012-12-04 NOTE — Progress Notes (Signed)
Sherry Chapman 01/02/1956 [id number]        57 y.o.  [id number] for annual exam.  Several issues below.  Past medical history,surgical history, medications, allergies, family history and social history were all reviewed and documented in the EPIC chart.  ROS:  Performed and pertinent positives and negatives are included in the history, assessment and plan .  Exam: Kim assistant Filed Vitals:   12/04/12 1043  BP: 114/66  Height: 5\' 5"  (1.651 m)  Weight: 158 lb (71.668 kg)   General appearance  Normal Skin grossly normal Head/Neck normal with no cervical or supraclavicular adenopathy thyroid normal Lungs  clear Cardiac RR, without RMG Abdominal  soft, nontender, without masses, organomegaly or hernia Breasts  examined lying and sitting without masses, retractions, discharge or axillary adenopathy. Pelvic  Ext/BUS/vagina  normal with atrophic changes  Cervix  normal with atrophic changes  Uterus  anteverted, normal size, shape and contour, midline and mobile nontender   Adnexa  Without masses or tenderness    Anus and perineum  normal   Rectovaginal  normal sphincter tone without palpated masses or tenderness.    Assessment/Plan:  57 y.o. [id number] female for annual exam.   1. Postmenopausal with worsening symptoms. Patient notes increasing hot flashes, sleep disturbances, foggy thinking, decreased libido, vaginal dryness and dyspareunia. No vaginal bleeding.  I reviewed the whole issue of HRT with her to include the WHI study with increased risk of stroke, heart attack, DVT and breast cancer. The ACOG and NAMS statements for lowest dose for the shortest period of time reviewed. Transdermal versus oral first-pass effect benefit discussed.  Issues of testosterone supplementation also reviewed. After lengthy discussion she wants a trial of HRT. She declines transdermal but would prefer oral understanding the possible increased risks of clotting. Estradiol 0.5 mg and Prometrium 100 mg nightly  prescribed. Refill x1 year. Patient does report any vaginal bleeding. Will followup if she is not having a satisfactory response. 2. Pap smear 2013. No Pap smear done today. No history of abnormal Pap smears previously. Plan repeat in 3 year interval. 3. Mammography overdue and she knows to schedule this. SBE monthly reviewed. 4. Colonoscopy 2009 with recommended repeat interval at 10 years. 5. DEXA never. She never followed up as recommended previously. I again encouraged her to do this but she declined at this time. Increase calcium vitamin D reviewed. 6. Health maintenance. No blood work done as it is all reportedly done through her primary physician's office. Followup in 1 year or sooner if any issues with HRT.  Note: This document was prepared with digital dictation and possible smart phrase technology. Any transcriptional errors that result from this process are unintentional.   Dara Lords MD, 11:09 AM 12/04/2012

## 2012-12-04 NOTE — Patient Instructions (Signed)
Start on estrogen and progesterone pill nightly. Call me if you have any issues or questions. Call if you do any vaginal bleeding. Otherwise followup in one year for annual exam.

## 2012-12-05 ENCOUNTER — Other Ambulatory Visit: Payer: Self-pay | Admitting: Gynecology

## 2012-12-05 DIAGNOSIS — R822 Biliuria: Secondary | ICD-10-CM

## 2012-12-05 LAB — URINALYSIS W MICROSCOPIC + REFLEX CULTURE
Bacteria, UA: NONE SEEN
Casts: NONE SEEN
Glucose, UA: NEGATIVE mg/dL
Ketones, ur: NEGATIVE mg/dL
Specific Gravity, Urine: 1.025 (ref 1.005–1.030)
pH: 6 (ref 5.0–8.0)

## 2012-12-06 ENCOUNTER — Telehealth: Payer: Self-pay

## 2012-12-06 ENCOUNTER — Other Ambulatory Visit: Payer: 59

## 2012-12-06 NOTE — Telephone Encounter (Signed)
Patient said this is about money for her. SHe said the CMET will be covered at her well visit but if she does it now she will pay out of pocket. She wants to cancel her lab appt today and will probably just wait and get it done at PCP but she is going to check into it a bit more with her ins co.

## 2012-12-06 NOTE — Telephone Encounter (Signed)
I contacted patient with results yesterday. Bilirubin in urine and you rec CMET.  She called today concerned that we will be doing labwork that her medical doctor will be doing in October when she goes for her visit with him.  Of course, we can give her a copy to take and she knows that.  Are you comfortable with her waiting to do her CMET when she sees him or should she come on now?

## 2012-12-06 NOTE — Telephone Encounter (Signed)
Recommend patient getting copies of her labs from My Chart and show to her doctor and they can work it up however he wants to. Okay to wait till then

## 2012-12-18 ENCOUNTER — Telehealth: Payer: Self-pay | Admitting: Family Medicine

## 2012-12-18 NOTE — Telephone Encounter (Signed)
Caller: [name]/Patient; Phone: [phone]; Reason for Call: Saw Dr Cooper Render last week, noted Bilirubin in urine, requesting Metabolic panel to be done.   Has Labs scheduled for 12/31/2012 prior to CPE 9/30.  Asking if lab results/metabolic panel can be sent to Dr Cooper Render so won't need to have labs done twice with extra cost.   Asking if Labs should be done earlier?  Before 9/23 ?   Please review, contact patient at [phone].

## 2012-12-19 NOTE — Telephone Encounter (Signed)
Left message on machine for patient to return our call 

## 2012-12-19 NOTE — Telephone Encounter (Signed)
She can just wait to have the cpx labs as scheduled

## 2012-12-20 NOTE — Telephone Encounter (Signed)
I spoke with pt  

## 2012-12-23 ENCOUNTER — Ambulatory Visit
Admission: RE | Admit: 2012-12-23 | Discharge: 2012-12-23 | Disposition: A | Discharge status: Home / Self Care | Payer: 59 | Source: Ambulatory Visit

## 2012-12-23 DIAGNOSIS — Z1231 Encounter for screening mammogram for malignant neoplasm of breast: Secondary | ICD-10-CM

## 2012-12-25 ENCOUNTER — Ambulatory Visit: Payer: 59 | Admitting: Family Medicine

## 2012-12-26 ENCOUNTER — Ambulatory Visit: Payer: 59 | Admitting: Family Medicine

## 2012-12-31 ENCOUNTER — Other Ambulatory Visit (INDEPENDENT_AMBULATORY_CARE_PROVIDER_SITE_OTHER): Payer: 59

## 2012-12-31 DIAGNOSIS — Z0000 Encounter for general adult medical examination without abnormal findings: Secondary | ICD-10-CM

## 2012-12-31 LAB — CBC WITH DIFFERENTIAL/PLATELET
Eosinophils Relative: 1.5 % (ref 0.0–5.0)
Lymphocytes Relative: 26.2 % (ref 12.0–46.0)
MCV: 88.2 fl (ref 78.0–100.0)
Monocytes Absolute: 0.2 10*3/uL (ref 0.1–1.0)
Neutrophils Relative %: 66.9 % (ref 43.0–77.0)
Platelets: 235 10*3/uL (ref 150.0–400.0)
RBC: 4.44 Mil/uL (ref 3.87–5.11)
WBC: 4.9 10*3/uL (ref 4.5–10.5)

## 2012-12-31 LAB — BASIC METABOLIC PANEL
BUN: 14 mg/dL (ref 6–23)
Calcium: 9.2 mg/dL (ref 8.4–10.5)
Chloride: 103 mEq/L (ref 96–112)
Creatinine, Ser: 0.9 mg/dL (ref 0.4–1.2)
GFR: 71.46 mL/min (ref >60.00)

## 2012-12-31 LAB — HEPATIC FUNCTION PANEL
ALT: 13 U/L (ref 0–35)
Alkaline Phosphatase: 51 U/L (ref 39–117)
Bilirubin, Direct: 0 mg/dL (ref 0.0–0.3)
Total Bilirubin: 0.7 mg/dL (ref 0.3–1.2)

## 2012-12-31 LAB — LIPID PANEL
Cholesterol: 190 mg/dL (ref 0–200)
LDL Cholesterol: 122 mg/dL — ABNORMAL HIGH (ref 0–99)

## 2012-12-31 LAB — POCT URINALYSIS DIPSTICK
Glucose, UA: NEGATIVE
Nitrite, UA: NEGATIVE
Protein, UA: NEGATIVE
Spec Grav, UA: 1.02
Urobilinogen, UA: 0.2

## 2013-01-01 ENCOUNTER — Telehealth: Payer: Self-pay | Admitting: Family Medicine

## 2013-01-01 NOTE — Telephone Encounter (Signed)
Pt concerned she may have a uti and wondered if you could read urine labs from 9/23 and let her know.

## 2013-01-02 MED ORDER — CIPROFLOXACIN HCL 500 MG PO TABS: 500.0000 mg | ORAL_TABLET | Freq: Two times a day (BID) | ORAL | Status: DC

## 2013-01-02 NOTE — Telephone Encounter (Signed)
I spoke with pt and sent in a script, see lab report.

## 2013-01-02 NOTE — Telephone Encounter (Signed)
Pt is callback needing return call today

## 2013-01-02 NOTE — Telephone Encounter (Signed)
I just looked at the labs and she does indeed have a UTI. We will put her on Cipro. See the lab report

## 2013-01-02 NOTE — Progress Notes (Signed)
Quick Note:  I spoke with pt and sent script e-scribe. ______ 

## 2013-01-06 ENCOUNTER — Encounter: Payer: 59 | Admitting: Family Medicine

## 2013-01-07 ENCOUNTER — Encounter: Payer: Self-pay | Admitting: Family Medicine

## 2013-01-07 ENCOUNTER — Ambulatory Visit (INDEPENDENT_AMBULATORY_CARE_PROVIDER_SITE_OTHER): Payer: 59 | Admitting: Family Medicine

## 2013-01-07 VITALS — BP 112/62 | HR 72 | Temp 98.2°F | Ht 65.0 in | Wt 160.0 lb

## 2013-01-07 DIAGNOSIS — Z0000 Encounter for general adult medical examination without abnormal findings: Secondary | ICD-10-CM

## 2013-01-07 MED ORDER — HYDROCHLOROTHIAZIDE 25 MG PO TABS: ORAL_TABLET | ORAL | Status: DC

## 2013-01-07 NOTE — Progress Notes (Signed)
  Subjective:    Patient ID: Sherry Chapman, female    DOB: [birth year], 56 y.o.   MRN: [id number]  HPI 57 yr old female for a cpx. She has only one item to discuss today. For the past month she has had intermittent swelling and pain in the right lower leg. It never gets red or warm.    Review of Systems  Constitutional: Negative.   HENT: Negative.   Eyes: Negative.   Respiratory: Negative.   Cardiovascular: Positive for leg swelling. Negative for chest pain and palpitations.  Gastrointestinal: Negative.   Genitourinary: Negative for dysuria, urgency, frequency, hematuria, flank pain, decreased urine volume, enuresis, difficulty urinating, pelvic pain and dyspareunia.  Musculoskeletal: Negative.   Skin: Negative.   Neurological: Negative.   Psychiatric/Behavioral: Negative.        Objective:   Physical Exam  Constitutional: She is oriented to person, place, and time. She appears well-developed and well-nourished. No distress.  HENT:  Head: Normocephalic and atraumatic.  Right Ear: External ear normal.  Left Ear: External ear normal.  Nose: Nose normal.  Mouth/Throat: Oropharynx is clear and moist. No oropharyngeal exudate.  Eyes: Conjunctivae and EOM are normal. Pupils are equal, round, and reactive to light. No scleral icterus.  Neck: Normal range of motion. Neck supple. No JVD present. No thyromegaly present.  Cardiovascular: Normal rate, regular rhythm, normal heart sounds and intact distal pulses.  Exam reveals no gallop and no friction rub.   No murmur heard. EKG normal   Pulmonary/Chest: Effort normal and breath sounds normal. No respiratory distress. She has no wheezes. She has no rales. She exhibits no tenderness.  Abdominal: Soft. Bowel sounds are normal. She exhibits no distension and no mass. There is no tenderness. There is no rebound and no guarding.  Musculoskeletal: Normal range of motion. She exhibits no edema and no tenderness.  Several small varicose veins in the  right calf. Not tender. No cords felt  Lymphadenopathy:    She has no cervical adenopathy.  Neurological: She is alert and oriented to person, place, and time. She has normal reflexes. No cranial nerve deficit. She exhibits normal muscle tone. Coordination normal.  Skin: Skin is warm and dry. No rash noted. No erythema.  Psychiatric: She has a normal mood and affect. Her behavior is normal. Judgment and thought content normal.          Assessment & Plan:  Well exam. She has periodic inflammation of varicose veins in the leg. She will try warm compresses and Advil prn. Recheck prn

## 2013-01-10 ENCOUNTER — Other Ambulatory Visit: Payer: Self-pay | Admitting: Family Medicine

## 2013-01-16 ENCOUNTER — Ambulatory Visit: Payer: 59 | Admitting: Family Medicine

## 2013-01-23 ENCOUNTER — Telehealth: Payer: Self-pay | Admitting: Family Medicine

## 2013-01-23 DIAGNOSIS — I83893 Varicose veins of bilateral lower extremities with other complications: Secondary | ICD-10-CM

## 2013-01-23 NOTE — Telephone Encounter (Signed)
Pt would like referral to vein specialist. Pt spoke w/ Dr Clent Ridges at her last visit about this issue. Pt states this is giving her pain especially at night.

## 2013-01-23 NOTE — Telephone Encounter (Signed)
Referral was done  

## 2013-01-23 NOTE — Telephone Encounter (Signed)
I spoke with pt  

## 2013-02-17 ENCOUNTER — Encounter: Payer: Self-pay | Admitting: Family Medicine

## 2013-02-17 ENCOUNTER — Ambulatory Visit (INDEPENDENT_AMBULATORY_CARE_PROVIDER_SITE_OTHER): Payer: 59 | Admitting: Family Medicine

## 2013-02-17 VITALS — BP 132/74 | HR 74 | Temp 97.7°F | Wt 161.0 lb

## 2013-02-17 DIAGNOSIS — I10 Essential (primary) hypertension: Secondary | ICD-10-CM

## 2013-02-17 DIAGNOSIS — F411 Generalized anxiety disorder: Secondary | ICD-10-CM

## 2013-02-17 MED ORDER — ALPRAZOLAM 0.25 MG PO TABS: 0.2500 mg | ORAL_TABLET | Freq: Three times a day (TID) | ORAL | Status: DC | PRN

## 2013-02-17 NOTE — Progress Notes (Signed)
Pre visit review using our clinic review tool, if applicable. No additional management support is needed unless otherwise documented below in the visit note. 

## 2013-02-17 NOTE — Progress Notes (Signed)
  Subjective:    Patient ID: Sherry Chapman, female    DOB: Mar 30, 1956, 57 y.o.   MRN: [id number]  HPI Here to follow up on anxiety. She takes Xanax prn and this helps quite a bit. She does feel some depression sx at times as well, but these are mild and do not last very long. She sleeps well and her appetite is good.    Review of Systems  Constitutional: Negative.   Respiratory: Negative.   Cardiovascular: Negative.   Neurological: Negative.   Psychiatric/Behavioral: Negative.        Objective:   Physical Exam  Constitutional: She is oriented to person, place, and time. She appears well-developed and well-nourished.  Cardiovascular: Normal rate, regular rhythm, normal heart sounds and intact distal pulses.   Pulmonary/Chest: Effort normal and breath sounds normal.  Neurological: She is alert and oriented to person, place, and time.  Psychiatric: She has a normal mood and affect. Her behavior is normal. Thought content normal.          Assessment & Plan:  Her anxiety is well controlled. The HTN is stable.

## 2013-02-20 ENCOUNTER — Encounter: Payer: Self-pay | Admitting: Family Medicine

## 2013-02-21 ENCOUNTER — Ambulatory Visit (INDEPENDENT_AMBULATORY_CARE_PROVIDER_SITE_OTHER): Payer: 59 | Admitting: Family Medicine

## 2013-02-21 ENCOUNTER — Telehealth: Payer: Self-pay | Admitting: Family Medicine

## 2013-02-21 ENCOUNTER — Encounter: Payer: Self-pay | Admitting: Family Medicine

## 2013-02-21 VITALS — BP 140/80 | HR 77 | Temp 98.0°F | Wt 161.0 lb

## 2013-02-21 DIAGNOSIS — F329 Major depressive disorder, single episode, unspecified: Secondary | ICD-10-CM

## 2013-02-21 DIAGNOSIS — F411 Generalized anxiety disorder: Secondary | ICD-10-CM

## 2013-02-21 MED ORDER — SERTRALINE HCL 50 MG PO TABS: 50.0000 mg | ORAL_TABLET | Freq: Every day | ORAL | Status: DC

## 2013-02-21 NOTE — Telephone Encounter (Signed)
Yes, per Dr. Clent Ridges

## 2013-02-21 NOTE — Progress Notes (Signed)
Pre visit review using our clinic review tool, if applicable. No additional management support is needed unless otherwise documented below in the visit note. 

## 2013-02-21 NOTE — Telephone Encounter (Signed)
Pt has been sch

## 2013-02-21 NOTE — Telephone Encounter (Signed)
Pt was seen on 11-10 for anxiety. Pt would like another appt today. Pt stated she is worst.Can I use sda slot?

## 2013-02-24 ENCOUNTER — Encounter: Payer: Self-pay | Admitting: Family Medicine

## 2013-02-24 NOTE — Progress Notes (Signed)
  Subjective:    Patient ID: Sherry Chapman, female    DOB: 14-May-1955, 57 y.o.   MRN: [id number]  HPI Here with her husband to discuss anxiety and depression. She has been dealing with a very stressful situation at work and it has been taking its toll on her. She has been using an occasional Xanax but this has not helped much. She feels sad, overwhelmed, and hopeless. She has trouble sleeping and eating. She asks me to put her out of work for awhile to try to work through this.    Review of Systems  Constitutional: Negative.   Psychiatric/Behavioral: Positive for sleep disturbance, dysphoric mood and decreased concentration. Negative for suicidal ideas, hallucinations, behavioral problems, confusion, self-injury and agitation. The patient is nervous/anxious. The patient is not hyperactive.        Objective:   Physical Exam  Constitutional: She is oriented to person, place, and time. She appears well-developed and well-nourished.  Neurological: She is alert and oriented to person, place, and time.  Psychiatric: Her behavior is normal. Judgment and thought content normal.  Anxious, tearful           Assessment & Plan:  She is dealing with a stressful work situation, so I will write her out of work from today until 03-24-13. Add Zoloft daily to the prn Xanax. I encouraged her to talk with a therapist as well. Recheck here in 2 weeks

## 2013-02-27 ENCOUNTER — Encounter: Payer: Self-pay | Admitting: *Deleted

## 2013-02-27 ENCOUNTER — Ambulatory Visit (INDEPENDENT_AMBULATORY_CARE_PROVIDER_SITE_OTHER): Payer: 59 | Admitting: Licensed Clinical Social Worker

## 2013-02-27 DIAGNOSIS — Z0279 Encounter for issue of other medical certificate: Secondary | ICD-10-CM

## 2013-02-27 DIAGNOSIS — F4323 Adjustment disorder with mixed anxiety and depressed mood: Secondary | ICD-10-CM

## 2013-02-28 ENCOUNTER — Ambulatory Visit (INDEPENDENT_AMBULATORY_CARE_PROVIDER_SITE_OTHER): Payer: 59 | Admitting: Family Medicine

## 2013-02-28 ENCOUNTER — Encounter: Payer: Self-pay | Admitting: Family Medicine

## 2013-02-28 VITALS — BP 148/80 | HR 68 | Temp 97.8°F | Wt 160.0 lb

## 2013-02-28 DIAGNOSIS — F411 Generalized anxiety disorder: Secondary | ICD-10-CM

## 2013-02-28 DIAGNOSIS — F329 Major depressive disorder, single episode, unspecified: Secondary | ICD-10-CM

## 2013-02-28 NOTE — Progress Notes (Signed)
Pre visit review using our clinic review tool, if applicable. No additional management support is needed unless otherwise documented below in the visit note. 

## 2013-02-28 NOTE — Progress Notes (Signed)
  Subjective:    Patient ID: Sherry Chapman, female    DOB: [birth year], 56 y.o.   MRN: [id number]  HPI Here to follow up. She is feeling better now that she has been off work for a week. She is taking Zoloft and Xanax, and she met with Judithe Modest for the first time. She thinks all these things are helping. She is not as depressed and she does not feel as helpless as before. Sleep is better and her appetite has improved.    Review of Systems  Constitutional: Negative.   Psychiatric/Behavioral: Positive for dysphoric mood and decreased concentration. Negative for hallucinations, confusion and sleep disturbance. The patient is nervous/anxious. The patient is not hyperactive.        Objective:   Physical Exam  Constitutional: She appears well-developed and well-nourished.  Psychiatric: She has a normal mood and affect. Her behavior is normal. Thought content normal.          Assessment & Plan:  She is feeling better. Recheck in 2 weeks

## 2013-03-04 ENCOUNTER — Ambulatory Visit (INDEPENDENT_AMBULATORY_CARE_PROVIDER_SITE_OTHER): Payer: 59 | Admitting: Licensed Clinical Social Worker

## 2013-03-04 DIAGNOSIS — F4323 Adjustment disorder with mixed anxiety and depressed mood: Secondary | ICD-10-CM

## 2013-03-13 ENCOUNTER — Encounter: Payer: Self-pay | Admitting: *Deleted

## 2013-03-14 ENCOUNTER — Encounter: Payer: Self-pay | Admitting: Family Medicine

## 2013-03-14 ENCOUNTER — Ambulatory Visit (INDEPENDENT_AMBULATORY_CARE_PROVIDER_SITE_OTHER): Payer: 59 | Admitting: Family Medicine

## 2013-03-14 VITALS — BP 130/72 | HR 70 | Temp 98.0°F | Wt 162.0 lb

## 2013-03-14 DIAGNOSIS — F411 Generalized anxiety disorder: Secondary | ICD-10-CM

## 2013-03-14 DIAGNOSIS — F329 Major depressive disorder, single episode, unspecified: Secondary | ICD-10-CM

## 2013-03-14 NOTE — Progress Notes (Signed)
   Subjective:    Patient ID: Sherry Chapman, female    DOB: 1955-07-24, 57 y.o.   MRN: [id number]  HPI Here to follow up anxiety and depression. She has been out of work for the past 3 weeks. She feels much better now and feels she can return to work next week. She is pleased with her current meds and she finds her therapy to be very useful.    Review of Systems  Constitutional: Negative.   Psychiatric/Behavioral: Negative.        Objective:   Physical Exam  Constitutional: She appears well-developed and well-nourished.  Psychiatric: She has a normal mood and affect. Her behavior is normal. Thought content normal.          Assessment & Plan:  She is doing well. Plan on returning to work on 03-17-13.

## 2013-03-14 NOTE — Progress Notes (Signed)
Pre visit review using our clinic review tool, if applicable. No additional management support is needed unless otherwise documented below in the visit note. 

## 2013-03-19 ENCOUNTER — Other Ambulatory Visit: Payer: Self-pay | Admitting: Family Medicine

## 2013-03-19 ENCOUNTER — Ambulatory Visit: Payer: 59 | Admitting: Licensed Clinical Social Worker

## 2013-03-26 ENCOUNTER — Telehealth: Payer: Self-pay

## 2013-03-26 MED ORDER — SERTRALINE HCL 50 MG PO TABS: 50.0000 mg | ORAL_TABLET | Freq: Every day | ORAL | Status: DC

## 2013-03-26 MED ORDER — POTASSIUM CHLORIDE CRYS ER 10 MEQ PO TBCR: 10.0000 meq | EXTENDED_RELEASE_TABLET | Freq: Every day | ORAL | Status: DC

## 2013-03-26 NOTE — Telephone Encounter (Signed)
Rx request for sertraline 50 mg, pot chlor er 10 meq, and alprazolam 0.25 mg.    Pls advise if alprazolam can be sent to mail order. Other rx sent.

## 2013-03-27 MED ORDER — ALPRAZOLAM 0.25 MG PO TABS: 0.2500 mg | ORAL_TABLET | Freq: Three times a day (TID) | ORAL | Status: DC | PRN

## 2013-03-27 NOTE — Telephone Encounter (Signed)
Written and ready to be faxed  

## 2013-03-28 NOTE — Telephone Encounter (Signed)
Script for Xanax was faxed to [facility] and the other two were sent e-scribe.

## 2013-04-01 ENCOUNTER — Telehealth: Payer: Self-pay | Admitting: Family Medicine

## 2013-04-01 NOTE — Telephone Encounter (Signed)
Please clarify the intended form of potassium either k-dur ( particle/crystal ) or klor con ( max matrix )? See paper fax request.

## 2013-04-01 NOTE — Telephone Encounter (Signed)
Give her Klor-Con please

## 2013-04-02 MED ORDER — POTASSIUM CHLORIDE CRYS ER 10 MEQ PO TBCR: 10.0000 meq | EXTENDED_RELEASE_TABLET | Freq: Every day | ORAL | Status: DC

## 2013-04-02 NOTE — Telephone Encounter (Signed)
I resent script to [facility]

## 2013-05-05 ENCOUNTER — Emergency Department (HOSPITAL_COMMUNITY)
Admission: EM | Admit: 2013-05-05 | Discharge: 2013-05-05 | Disposition: A | Discharge status: Home / Self Care | Payer: 59 | Source: Home / Self Care | Attending: Family Medicine | Admitting: Family Medicine

## 2013-05-05 ENCOUNTER — Encounter (HOSPITAL_COMMUNITY): Payer: Self-pay | Admitting: Emergency Medicine

## 2013-05-05 DIAGNOSIS — R002 Palpitations: Secondary | ICD-10-CM

## 2013-05-05 NOTE — ED Notes (Signed)
C/o irregular heart beat x 2 days.   Denies chest pain, sob, and diaphoresis.  No n/v.  "feels like my heart is doing its on thing"

## 2013-05-05 NOTE — ED Provider Notes (Signed)
CSN: [id number]     Arrival date & time 05/05/13  1937 History   First MD Initiated Contact with Patient 05/05/13 1957     Chief Complaint  Patient presents with  . Irregular Heart Beat   (Consider location/radiation/quality/duration/timing/severity/associated sxs/prior Treatment) Patient is a 58 y.o. female presenting with palpitations. The history is provided by the patient and the spouse.  Palpitations Palpitations quality:  Irregular Onset quality:  Sudden Duration:  3 days Progression:  Unchanged Chronicity:  New Context: anxiety   Associated symptoms: no chest pain, no chest pressure, no dizziness, no lower extremity edema, no orthopnea and no shortness of breath     Past Medical History  Diagnosis Date  . Allergy   . Anxiety   . Back pain   . Benign neoplasm of adrenal gland   . Depression   . GERD (gastroesophageal reflux disease)   . Hyperlipidemia   . Hypertension   . Restless leg syndrome   . Vertigo   . Hematuria, unspecified    Past Surgical History  Procedure Laterality Date  . Nasal sinus surgery    . Nasal septum surgery    . Excision of dorsal ganglion from right wrist 06-05-2008 dr sypher    . Colonoscopy  05-18-07    per Dr. Deatra Ina, repeat in 10 yrs    Family History  Problem Relation Age of Onset  . Heart disease Mother   . Hypertension Mother   . Diabetes Mother   . Alcohol abuse Father   . Coronary artery disease Father   . Hypertension Father   . Hypertension Sister   . Diabetes Sister   . Hypertension Brother   . Diabetes Brother    History  Substance Use Topics  . Smoking status: Never Smoker   . Smokeless tobacco: Never Used  . Alcohol Use: No   OB History   Grav Para Term Preterm Abortions TAB SAB Ect Mult Living   3 2 2  1     2      Review of Systems  Constitutional: Negative.   HENT: Negative.   Respiratory: Negative.  Negative for shortness of breath.   Cardiovascular: Positive for palpitations. Negative for chest pain,  orthopnea and leg swelling.  Gastrointestinal: Negative.   Neurological: Negative for dizziness.    Allergies  Prednisone and Clarithromycin  Home Medications   Current Outpatient Rx  Name  Route  Sig  Dispense  Refill  . amLODipine (NORVASC) 5 MG tablet      TAKE ONE TABLET BY MOUTH DAILY   90 tablet   2     CYCLE FILL MEDICATION. Authorization is required f ...   . etodolac (LODINE) 500 MG tablet   Oral   Take 1 tablet (500 mg total) by mouth daily.   90 tablet   3   . hydrochlorothiazide (HYDRODIURIL) 25 MG tablet      TAKE ONE TABLET BY MOUTH DAILY   90 tablet   3   . Multiple Vitamin (MULTIVITAMIN) tablet   Oral   Take 1 tablet by mouth daily.         . potassium chloride (K-DUR,KLOR-CON) 10 MEQ tablet   Oral   Take 1 tablet (10 mEq total) by mouth daily.   90 tablet   1     Please dispense Klor-Con   . acetaminophen (TYLENOL) 325 MG tablet   Oral   Take 325 mg by mouth every 6 (six) hours as needed.          Marland Kitchen  ALPRAZolam (XANAX) 0.25 MG tablet   Oral   Take 1 tablet (0.25 mg total) by mouth 3 (three) times daily as needed for anxiety.   270 tablet   1   . BYSTOLIC 10 MG tablet      TAKE 1 TABLET DAILY   90 tablet   3   . diphenhydrAMINE (SOMINEX) 25 MG tablet   Oral   Take 25 mg by mouth as needed.         Marland Kitchen estradiol (ESTRACE) 0.5 MG tablet   Oral   Take 1 tablet (0.5 mg total) by mouth daily.   30 tablet   11   . Flaxseed, Linseed, (FLAX SEED OIL PO)   Oral   Take by mouth daily.         . progesterone (PROMETRIUM) 100 MG capsule   Oral   Take 1 capsule (100 mg total) by mouth at bedtime.   30 capsule   11   . sertraline (ZOLOFT) 50 MG tablet   Oral   Take 1 tablet (50 mg total) by mouth daily.   90 tablet   1    BP 167/97  Pulse 88  Temp(Src) 97.7 F (36.5 C) (Oral)  Resp 20  SpO2 98% Physical Exam  Nursing note and vitals reviewed. Constitutional: She is oriented to person, place, and time. She appears  well-developed and well-nourished.  HENT:  Mouth/Throat: Oropharynx is clear and moist.  Eyes: Pupils are equal, round, and reactive to light.  Neck: Normal range of motion. Neck supple. No thyromegaly present.  Cardiovascular: Normal rate, regular rhythm, normal heart sounds and intact distal pulses.   Pulmonary/Chest: Effort normal and breath sounds normal.  Lymphadenopathy:    She has no cervical adenopathy.  Neurological: She is alert and oriented to person, place, and time.  Skin: Skin is warm and dry.    ED Course  Procedures (including critical care time) Labs Review Labs Reviewed - No data to display Imaging Review No results found.  EKG Interpretation    Date/Time:    Ventricular Rate:    PR Interval:    QRS Duration:   QT Interval:    QTC Calculation:   R Axis:     Text Interpretation:              MDM  [id number].    Billy Fischer, MD 05/05/13 2023

## 2013-05-05 NOTE — Discharge Instructions (Signed)
See your doctor if further concerns

## 2013-08-06 ENCOUNTER — Other Ambulatory Visit: Payer: Self-pay | Admitting: Family Medicine

## 2013-08-06 ENCOUNTER — Telehealth: Payer: Self-pay | Admitting: Family Medicine

## 2013-08-06 NOTE — Telephone Encounter (Signed)
The script was printed for Xanax, this was cancelled. I called pharmacy and left a voice message, pt should have refills left on this medication.

## 2013-08-06 NOTE — Telephone Encounter (Signed)
Refill Norvasc for one year. No refills for Xanax because she already has enough to last until 09-25-13.

## 2013-08-14 ENCOUNTER — Encounter: Payer: Self-pay | Admitting: Physician Assistant

## 2013-08-14 ENCOUNTER — Ambulatory Visit (INDEPENDENT_AMBULATORY_CARE_PROVIDER_SITE_OTHER): Payer: 59 | Admitting: Physician Assistant

## 2013-08-14 VITALS — BP 144/94 | HR 64 | Temp 97.8°F | Resp 16 | Ht 65.0 in | Wt 180.0 lb

## 2013-08-14 DIAGNOSIS — I10 Essential (primary) hypertension: Secondary | ICD-10-CM

## 2013-08-14 DIAGNOSIS — W57XXXA Bitten or stung by nonvenomous insect and other nonvenomous arthropods, initial encounter: Secondary | ICD-10-CM

## 2013-08-14 DIAGNOSIS — T148 Other injury of unspecified body region: Secondary | ICD-10-CM

## 2013-08-14 NOTE — Patient Instructions (Signed)
Watch the area closely to make sure that inflammation isn't spreading, especially in the pattern we outlined.  If the inflammtation does spread, or if you develop symptoms of fever, bodyaches, headache, come back to the clinic and we may start an antibiotic at that time.  Monitor your blood pressures at home and record the blood pressure readings in the time of day you take it. We will followup in 2 weeks to make sure that this is back to normal.  Followup is symptoms worsen and as needed.  Tick Bite Information Ticks are insects that attach themselves to the skin. There are many types of ticks. Common types include wood ticks and deer ticks. Sometimes, ticks carry diseases that can make a person very ill. The most common places for ticks to attach themselves are the scalp, neck, armpits, waist, and groin.  HOW CAN YOU PREVENT TICK BITES? Take these steps to help prevent tick bites when you are outdoors:  Wear long sleeves and long pants.  Wear white clothes so you can see ticks more easily.  Tuck your pant legs into your socks.  If walking on a trail, stay in the middle of the trail to avoid brushing against bushes.  Avoid walking through areas with long grass.  Put bug spray on all skin that is showing and along boot tops, pant legs, and sleeve cuffs.  Check clothes, hair, and skin often and before going inside.  Brush off any ticks that are not attached.  Take a shower or bath as soon as possible after being outdoors. HOW SHOULD YOU REMOVE A TICK? Ticks should be removed as soon as possible to help prevent diseases. 1. If latex gloves are available, put them on before trying to remove a tick. 2. Use tweezers to grasp the tick as close to the skin as possible. You may also use curved forceps or a tick removal tool. Grasp the tick as close to its head as possible. Avoid grasping the tick on its body. 3. Pull gently upward until the tick lets go. Do not twist the tick or jerk it  suddenly. This may break off the tick's head or mouth parts. 4. Do not squeeze or crush the tick's body. This could force disease-carrying fluids from the tick into your body. 5. After the tick is removed, wash the bite area and your hands with soap and water or alcohol. 6. Apply a small amount of antiseptic cream or ointment to the bite site. 7. Wash any tools that were used. Do not try to remove a tick by applying a hot match, petroleum jelly, or fingernail polish to the tick. These methods do not work. They may also increase the chances of disease being spread from the tick bite. WHEN SHOULD YOU SEEK HELP? Contact your health care provider if you are unable to remove a tick or if a part of the tick breaks off in the skin. After a tick bite, you need to watch for signs and symptoms of diseases that can be spread by ticks. Contact your health care provider if you develop any of the following:  Fever.  Rash.  Redness and puffiness (swelling) in the area of the tick bite.  Tender, puffy lymph glands.  Watery poop (diarrhea).  Weight loss.  Cough.  Feeling more tired than normal (fatigue).  Muscle, joint, or bone pain.  Belly (abdominal) pain.  Headache.  Change in your level of consciousness.  Trouble walking or moving your legs.  Loss of feeling (  numbness) in the legs.  Loss of movement (paralysis).  Shortness of breath.  Confusion.  Throwing up (vomiting) many times. Document Released: 06/21/2009 Document Revised: 11/27/2012 Document Reviewed: 09/04/2012 [facility] Patient Information 2014 [place].

## 2013-08-14 NOTE — Progress Notes (Signed)
Subjective:    Patient ID: Sherry Chapman, female    DOB: 1955/04/13, 58 y.o.   MRN: [id number]  HPI Patient is a 58 year old Caucasian female presenting to the clinic today for a tick bite. She states that yesterday she was in her outbuilding and didn't notice being bitten by any ticks but this morning awoke and saw a tick on her left thigh in the mirror. Patient says that the tick was a very small flat dark colored take, and that she didn't notice any particular pattern. She did notice that it had some tissue in its mouth and it jaw was intact and didn't appear to be in the bite. She does admit to having dogs, and states that some of the dogs had ticks. She presents because she has noticed some redness and bruising developed over the course of the morning at the site of the bite, however she has not noticed any major swelling or exquisite tenderness to the bite. She denies fevers, chills, nausea, vomiting, diarrhea, shortness of breath, chest pain, headache, dizziness.   Review of Systems As per the history of present illness and are otherwise negative.  Past Medical History  Diagnosis Date  . Allergy   . Anxiety   . Back pain   . Benign neoplasm of adrenal gland   . Depression   . GERD (gastroesophageal reflux disease)   . Hyperlipidemia   . Hypertension   . Restless leg syndrome   . Vertigo   . Hematuria, unspecified    Past Surgical History  Procedure Laterality Date  . Nasal sinus surgery    . Nasal septum surgery    . Excision of dorsal ganglion from right wrist 06-05-2008 dr sypher    . Colonoscopy  05-18-07    per Dr. Deatra Ina, repeat in 10 yrs     reports that she has never smoked. She has never used smokeless tobacco. She reports that she does not drink alcohol or use illicit drugs. family history includes Alcohol abuse in her father; Coronary artery disease in her father; Diabetes in her brother, mother, and sister; Heart disease in her mother; Hypertension in her brother,  father, mother, and sister. Allergies  Allergen Reactions  . Prednisone Anxiety    Insomnia, Hypersensitivity  . Clarithromycin     Biaxin - REACTION: Nausea  . Clindamycin/Lincomycin Diarrhea       Objective:   Physical Exam  Nursing note and vitals reviewed. Constitutional: She is oriented to person, place, and time. She appears well-developed and well-nourished. No distress.  HENT:  Head: Normocephalic and atraumatic.  Eyes: Conjunctivae and EOM are normal. Pupils are equal, round, and reactive to light.  Neck: Normal range of motion.  Cardiovascular: Normal rate, regular rhythm, normal heart sounds and intact distal pulses.  Exam reveals no gallop and no friction rub.   No murmur heard. Pulmonary/Chest: Effort normal and breath sounds normal. No respiratory distress. She has no wheezes. She has no rales. She exhibits no tenderness.  Musculoskeletal: Normal range of motion.  Neurological: She is alert and oriented to person, place, and time.  Skin: Skin is warm and dry. No rash noted. She is not diaphoretic. No erythema. No pallor.  There is a small subcentimeter area of bruising with a central bite mark on the left upper thigh near the groin. There is no swelling or palpable mass. There is no tenderness to palpation. There are no visible arthropod jaw or other body parts in the bite. There is no erythema  and no target lesions.  Psychiatric: She has a normal mood and affect. Her behavior is normal. Judgment and thought content normal.    Filed Vitals:   08/14/13 1132  BP: 144/94  Pulse: 64  Temp: 97.8 F (36.6 C)  Resp: 16   Lab Results  Component Value Date   WBC 4.9 12/31/2012   HGB 13.2 12/31/2012   HCT 39.1 12/31/2012   PLT 235.0 12/31/2012   GLUCOSE 100* 12/31/2012   CHOL 190 12/31/2012   TRIG 89.0 12/31/2012   HDL 49.90 12/31/2012   LDLDIRECT 131.2 10/19/2011   LDLCALC 122* 12/31/2012   ALT 13 12/31/2012   AST 13 12/31/2012   NA 139 12/31/2012   K 3.5 12/31/2012   CL  103 12/31/2012   CREATININE 0.9 12/31/2012   BUN 14 12/31/2012   CO2 29 12/31/2012   TSH 1.60 12/31/2012      Assessment & Plan:  Alizeh was seen today for tick removal.  Diagnoses and associated orders for this visit:  Tick bite - Watchful waiting. Made pen marks indicating the current level of inflammation, and additional marks representing 1 and 2 inches away from center. Pt will monitor for any increased swelling or inflammation.   Unspecified essential hypertension - Patient's blood pressure today is 144/94, currently asymptomatic. Patient states that normally her blood pressure runs in the 120s/80s at home. Patient states that she has been stressed about work and family matters recently in addition to the tick bite today. Will have patient monitor home BP daily for the next few weeks and followup in 2-3 weeks for recheck.  Patient Instructions  Watch the area closely to make sure that inflammation isn't spreading, especially in the pattern we outlined.  If the inflammtation does spread, or if you develop symptoms of fever, bodyaches, headache, come back to the clinic and we may start an antibiotic at that time.  Monitor your blood pressures at home and record the blood pressure readings in the time of day you take it. We will followup in 2 weeks to make sure that this is back to normal.  Followup is symptoms worsen and as needed.

## 2013-08-14 NOTE — Progress Notes (Signed)
Pre visit review using our clinic review tool, if applicable. No additional management support is needed unless otherwise documented below in the visit note. 

## 2013-08-27 ENCOUNTER — Ambulatory Visit: Payer: Self-pay | Admitting: Family Medicine

## 2013-09-25 ENCOUNTER — Ambulatory Visit (INDEPENDENT_AMBULATORY_CARE_PROVIDER_SITE_OTHER): Payer: 59 | Admitting: Licensed Clinical Social Worker

## 2013-09-25 DIAGNOSIS — F4323 Adjustment disorder with mixed anxiety and depressed mood: Secondary | ICD-10-CM

## 2013-10-03 ENCOUNTER — Ambulatory Visit: Payer: 59 | Admitting: Licensed Clinical Social Worker

## 2013-10-13 ENCOUNTER — Ambulatory Visit: Payer: Self-pay | Admitting: Family Medicine

## 2013-10-29 ENCOUNTER — Telehealth: Payer: Self-pay | Admitting: Family Medicine

## 2013-10-29 ENCOUNTER — Ambulatory Visit (INDEPENDENT_AMBULATORY_CARE_PROVIDER_SITE_OTHER): Payer: 59 | Admitting: Family Medicine

## 2013-10-29 ENCOUNTER — Encounter: Payer: Self-pay | Admitting: Family Medicine

## 2013-10-29 VITALS — BP 138/78 | HR 78 | Temp 98.2°F | Ht 65.0 in | Wt 187.0 lb

## 2013-10-29 DIAGNOSIS — G2581 Restless legs syndrome: Secondary | ICD-10-CM

## 2013-10-29 DIAGNOSIS — E785 Hyperlipidemia, unspecified: Secondary | ICD-10-CM

## 2013-10-29 DIAGNOSIS — F411 Generalized anxiety disorder: Secondary | ICD-10-CM

## 2013-10-29 DIAGNOSIS — I10 Essential (primary) hypertension: Secondary | ICD-10-CM

## 2013-10-29 DIAGNOSIS — R350 Frequency of micturition: Secondary | ICD-10-CM

## 2013-10-29 LAB — POCT URINALYSIS DIPSTICK
Bilirubin, UA: NEGATIVE
Glucose, UA: NEGATIVE
Ketones, UA: NEGATIVE
Nitrite, UA: NEGATIVE
PH UA: 5.5
Protein, UA: NEGATIVE
SPEC GRAV UA: 1.025
UROBILINOGEN UA: 0.2

## 2013-10-29 MED ORDER — ESCITALOPRAM OXALATE 10 MG PO TABS: 10.0000 mg | ORAL_TABLET | Freq: Every day | ORAL | Status: DC

## 2013-10-29 MED ORDER — AMLODIPINE BESYLATE 5 MG PO TABS: ORAL_TABLET | ORAL | Status: DC

## 2013-10-29 MED ORDER — NEBIVOLOL HCL 10 MG PO TABS: ORAL_TABLET | ORAL | Status: DC

## 2013-10-29 NOTE — Progress Notes (Signed)
   Subjective:    Patient ID: Sherry Chapman, female    DOB: 03/04/1956, 58 y.o.   MRN: [id number]  HPI Here fo refills. Her BP has been stable. She feels good although her anxiety has kicked back up again. She stopped taking Zoloft sometime ago because it caused her legs to jump, but it worked well for her anxiety.    Review of Systems  Constitutional: Negative.   Respiratory: Negative.   Cardiovascular: Negative.   Psychiatric/Behavioral: Negative for dysphoric mood. The patient is nervous/anxious.        Objective:   Physical Exam  Constitutional: She appears well-developed and well-nourished.  Cardiovascular: Normal rate, regular rhythm, normal heart sounds and intact distal pulses.   Pulmonary/Chest: Effort normal and breath sounds normal.  Psychiatric: She has a normal mood and affect. Her behavior is normal. Thought content normal.          Assessment & Plan:  Her HTN is stable. Try Lexapro 10 mg daily.

## 2013-10-29 NOTE — Progress Notes (Signed)
Pre visit review using our clinic review tool, if applicable. No additional management support is needed unless otherwise documented below in the visit note. 

## 2013-10-29 NOTE — Telephone Encounter (Signed)
Relevant patient education assigned to patient using Emmi. ° °

## 2013-10-30 ENCOUNTER — Telehealth: Payer: Self-pay | Admitting: Family Medicine

## 2013-10-30 NOTE — Telephone Encounter (Signed)
Patient states she was evaluated by Dr. Sarajane Jews in office yesterday, 10/29/13, to request changes in her anxiety medications. Patient reports Zoloft was discontinued due to twitching in legs and abnormal heart rate. Lexapro was prescribed.  However, patient states she incorrectly informed Dr. Sarajane Jews that she had been on Wellbutrin in the past and had to discontinue due to sleepiness and twitching in legs. Patient states it was actually Lexapro that caused these unwanted side effects. Patient states she started Lexapro regardless but states "this is not the medication I want to be on." Patient aware due to phone call so close to 5pm, this will not be viewed until next business day. PLEASE FOLLOW UP WITH PATIENT. THANK YOU.

## 2013-10-31 MED ORDER — BUPROPION HCL ER (XL) 150 MG PO TB24
150.0000 mg | ORAL_TABLET | Freq: Every day | ORAL | Status: DC
Start: 2013-10-31 — End: 2014-03-25

## 2013-10-31 NOTE — Telephone Encounter (Signed)
In that case she should stop the Lexapro and let's try Wellbutrin instead. Call in Wellbutrin XL 150 mg daily, #30 with 2 rf

## 2013-10-31 NOTE — Telephone Encounter (Signed)
I spoke with pt, cancelled the Lexapro and sent new script e-scribe.

## 2013-11-04 ENCOUNTER — Telehealth: Payer: Self-pay | Admitting: Family Medicine

## 2013-11-04 NOTE — Telephone Encounter (Signed)
Pt req call back  abour results of UTI

## 2013-11-06 MED ORDER — CIPROFLOXACIN HCL 500 MG PO TABS: 500.0000 mg | ORAL_TABLET | Freq: Two times a day (BID) | ORAL | Status: DC

## 2013-11-06 NOTE — Telephone Encounter (Signed)
I spoke with pt and sent script e-scribe. 

## 2013-11-06 NOTE — Telephone Encounter (Signed)
It appears that she has a mild UTI. Call in Cipro 500 mg bid for 7 days

## 2013-12-02 ENCOUNTER — Other Ambulatory Visit: Payer: Self-pay

## 2013-12-02 DIAGNOSIS — Z1231 Encounter for screening mammogram for malignant neoplasm of breast: Secondary | ICD-10-CM

## 2013-12-11 ENCOUNTER — Ambulatory Visit: Payer: 59 | Admitting: Licensed Clinical Social Worker

## 2013-12-25 ENCOUNTER — Ambulatory Visit: Payer: Self-pay

## 2014-01-13 ENCOUNTER — Ambulatory Visit (INDEPENDENT_AMBULATORY_CARE_PROVIDER_SITE_OTHER): Payer: 59 | Admitting: Gynecology

## 2014-01-13 ENCOUNTER — Encounter: Payer: Self-pay | Admitting: Gynecology

## 2014-01-13 VITALS — BP 120/76 | Ht 65.0 in | Wt 189.0 lb

## 2014-01-13 DIAGNOSIS — Z01419 Encounter for gynecological examination (general) (routine) without abnormal findings: Secondary | ICD-10-CM

## 2014-01-13 DIAGNOSIS — N952 Postmenopausal atrophic vaginitis: Secondary | ICD-10-CM

## 2014-01-13 DIAGNOSIS — N951 Menopausal and female climacteric states: Secondary | ICD-10-CM

## 2014-01-13 NOTE — Progress Notes (Signed)
Sherry Chapman 1955-09-10 [id number]        58 y.o.  [id number] for annual exam.  Several issues noted below.  Past medical history,surgical history, problem list, medications, allergies, family history and social history were all reviewed and documented as reviewed in the EPIC chart.  ROS:  12 system ROS performed with pertinent positives and negatives included in the history, assessment and plan.   Additional significant findings :  none   Exam: [name] Vitals:   01/13/14 1537  BP: 120/76  Height: 5\' 5"  (1.651 m)  Weight: 189 lb (85.73 kg)   General appearance:  Normal affect, orientation and appearance. Skin: Grossly normal HEENT: Without gross lesions.  No cervical or supraclavicular adenopathy. Thyroid normal.  Lungs:  Clear without wheezing, rales or rhonchi Cardiac: RR, without RMG Abdominal:  Soft, nontender, without masses, guarding, rebound, organomegaly or hernia Breasts:  Examined lying and sitting without masses, retractions, discharge or axillary adenopathy. Pelvic:  Ext/BUS/vagina with atrophic changes  Cervix with atrophic changes  Uterus anteverted, normal size, shape and contour, midline and mobile nontender   Adnexa  Without masses or tenderness    Anus and perineum  Normal   Rectovaginal  Normal sphincter tone without palpated masses or tenderness.    Assessment/Plan:  58 y.o. [id number] female for annual exam.   1. Postmenopausal/atrophic genital changes/menopausal symptoms. Patient continues to have hot flashes and night sweats as well as sleep disturbances.  We discussed HRT last year and she started estradiol 0.5 mg oral and Prometrium 100 mg bedtime. She stopped it for some reason but she cannot remember why. Options for management reviewed to include OTC products such as soy based. Non-hormonal pharmacologic such as Effexor although she recently was put on Wellbutrin 2 months ago and reinitiation of HRT. I again discussed the WHI study with increased  risk of stroke heart attack DVT of breast cancer. I reviewed transdermal benefits over oral from the first pass effect. If patient decides to reinitiating she again would like to start on the oral understanding and accepting the issues of thrombosis. At this point though she wants to wait and see and will call me if she wants to start HRT. 2. Mammography due now and I reminded her to schedule this and she agrees to do so. SBE monthly reviewed.  3. Pap smear 2013. Plan repeat Pap smear next year a 3 year interval. No history of significant abnormal Pap smears previously. 4. DEXA never. Patient never scheduled as recommended previously. She again declines to schedule balance and she would prefer to wait and we both agreed to schedule at age 34. Increase calcium vitamin D reviewed. 5. Colonoscopy 2009 with patients recommended repeat interval 10 years. 6. Health maintenance. Patient was diagnosed with a "benign" adrenal tumor. She's concerned about this and had questions. I see no results of tests but she does report having a CT scan apparently with dye. Also had blood work to include historically catecholamines. As she has never seen an endocrinologist as a baseline per her history she would prefer to see one just to let them review everything and will and help her make that arrangement. No routine blood work done a she reports this is all done through her primary physician's office. Will check baseline urinalysis.  Follow up in one year, sooner as needed.    Anastasio Auerbach MD, 4:23 PM 01/13/2014

## 2014-01-13 NOTE — Patient Instructions (Signed)
Office will contact you to help arrange for an endocrinology appointment in reference to the adrenal tumor.  You may obtain a copy of any labs that were done today by logging onto MyChart as outlined in the instructions provided with your AVS (after visit summary). The office will not call with normal lab results but certainly if there are any significant abnormalities then we will contact you.   Health Maintenance, Female A healthy lifestyle and preventative care can promote health and wellness.  Maintain regular health, dental, and eye exams.  Eat a healthy diet. Foods like vegetables, fruits, whole grains, low-fat dairy products, and lean protein foods contain the nutrients you need without too many calories. Decrease your intake of foods high in solid fats, added sugars, and salt. Get information about a proper diet from your caregiver, if necessary.  Regular physical exercise is one of the most important things you can do for your health. Most adults should get at least 150 minutes of moderate-intensity exercise (any activity that increases your heart rate and causes you to sweat) each week. In addition, most adults need muscle-strengthening exercises on 2 or more days a week.   Maintain a healthy weight. The body mass index (BMI) is a screening tool to identify possible weight problems. It provides an estimate of body fat based on height and weight. Your caregiver can help determine your BMI, and can help you achieve or maintain a healthy weight. For adults 20 years and older:  A BMI below 18.5 is considered underweight.  A BMI of 18.5 to 24.9 is normal.  A BMI of 25 to 29.9 is considered overweight.  A BMI of 30 and above is considered obese.  Maintain normal blood lipids and cholesterol by exercising and minimizing your intake of saturated fat. Eat a balanced diet with plenty of fruits and vegetables. Blood tests for lipids and cholesterol should begin at age 54 and be repeated every  5 years. If your lipid or cholesterol levels are high, you are over 50, or you are a high risk for heart disease, you may need your cholesterol levels checked more frequently.Ongoing high lipid and cholesterol levels should be treated with medicines if diet and exercise are not effective.  If you smoke, find out from your caregiver how to quit. If you do not use tobacco, do not start.  Lung cancer screening is recommended for adults aged 78 80 years who are at high risk for developing lung cancer because of a history of smoking. Yearly low-dose computed tomography (CT) is recommended for people who have at least a 30-pack-year history of smoking and are a current smoker or have quit within the past 15 years. A pack year of smoking is smoking an average of 1 pack of cigarettes a day for 1 year (for example: 1 pack a day for 30 years or 2 packs a day for 15 years). Yearly screening should continue until the smoker has stopped smoking for at least 15 years. Yearly screening should also be stopped for people who develop a health problem that would prevent them from having lung cancer treatment.  If you are pregnant, do not drink alcohol. If you are breastfeeding, be very cautious about drinking alcohol. If you are not pregnant and choose to drink alcohol, do not exceed 1 drink per day. One drink is considered to be 12 ounces (355 mL) of beer, 5 ounces (148 mL) of wine, or 1.5 ounces (44 mL) of liquor.  Avoid use of street  drugs. Do not share needles with anyone. Ask for help if you need support or instructions about stopping the use of drugs.  High blood pressure causes heart disease and increases the risk of stroke. Blood pressure should be checked at least every 1 to 2 years. Ongoing high blood pressure should be treated with medicines, if weight loss and exercise are not effective.  If you are 59 to 57 years old, ask your caregiver if you should take aspirin to prevent strokes.  Diabetes screening  involves taking a blood sample to check your fasting blood sugar level. This should be done once every 3 years, after age 75, if you are within normal weight and without risk factors for diabetes. Testing should be considered at a younger age or be carried out more frequently if you are overweight and have at least 1 risk factor for diabetes.  Breast cancer screening is essential preventative care for women. You should practice "breast self-awareness." This means understanding the normal appearance and feel of your breasts and may include breast self-examination. Any changes detected, no matter how small, should be reported to a caregiver. Women in their 45s and 30s should have a clinical breast exam (CBE) by a caregiver as part of a regular health exam every 1 to 3 years. After age 25, women should have a CBE every year. Starting at age 75, women should consider having a mammogram (breast X-ray) every year. Women who have a family history of breast cancer should talk to their caregiver about genetic screening. Women at a high risk of breast cancer should talk to their caregiver about having an MRI and a mammogram every year.  Breast cancer gene (BRCA)-related cancer risk assessment is recommended for women who have family members with BRCA-related cancers. BRCA-related cancers include breast, ovarian, tubal, and peritoneal cancers. Having family members with these cancers may be associated with an increased risk for harmful changes (mutations) in the breast cancer genes BRCA1 and BRCA2. Results of the assessment will determine the need for genetic counseling and BRCA1 and BRCA2 testing.  The Pap test is a screening test for cervical cancer. Women should have a Pap test starting at age 96. Between ages 37 and 12, Pap tests should be repeated every 2 years. Beginning at age 61, you should have a Pap test every 3 years as long as the past 3 Pap tests have been normal. If you had a hysterectomy for a problem that  was not cancer or a condition that could lead to cancer, then you no longer need Pap tests. If you are between ages 80 and 93, and you have had normal Pap tests going back 10 years, you no longer need Pap tests. If you have had past treatment for cervical cancer or a condition that could lead to cancer, you need Pap tests and screening for cancer for at least 20 years after your treatment. If Pap tests have been discontinued, risk factors (such as a new sexual partner) need to be reassessed to determine if screening should be resumed. Some women have medical problems that increase the chance of getting cervical cancer. In these cases, your caregiver may recommend more frequent screening and Pap tests.  The human papillomavirus (HPV) test is an additional test that may be used for cervical cancer screening. The HPV test looks for the virus that can cause the cell changes on the cervix. The cells collected during the Pap test can be tested for HPV. The HPV test could be  used to screen women aged 10 years and older, and should be used in women of any age who have unclear Pap test results. After the age of 107, women should have HPV testing at the same frequency as a Pap test.  Colorectal cancer can be detected and often prevented. Most routine colorectal cancer screening begins at the age of 43 and continues through age 60. However, your caregiver may recommend screening at an earlier age if you have risk factors for colon cancer. On a yearly basis, your caregiver may provide home test kits to check for hidden blood in the stool. Use of a small camera at the end of a tube, to directly examine the colon (sigmoidoscopy or colonoscopy), can detect the earliest forms of colorectal cancer. Talk to your caregiver about this at age 41, when routine screening begins. Direct examination of the colon should be repeated every 5 to 10 years through age 13, unless early forms of pre-cancerous polyps or small growths are  found.  Hepatitis C blood testing is recommended for all people born from [birth year] through 1964 and any individual with known risks for hepatitis C.  Practice safe sex. Use condoms and avoid high-risk sexual practices to reduce the spread of sexually transmitted infections (STIs). Sexually active women aged 79 and younger should be checked for Chlamydia, which is a common sexually transmitted infection. Older women with new or multiple partners should also be tested for Chlamydia. Testing for other STIs is recommended if you are sexually active and at increased risk.  Osteoporosis is a disease in which the bones lose minerals and strength with aging. This can result in serious bone fractures. The risk of osteoporosis can be identified using a bone density scan. Women ages 58 and over and women at risk for fractures or osteoporosis should discuss screening with their caregivers. Ask your caregiver whether you should be taking a calcium supplement or vitamin D to reduce the rate of osteoporosis.  Menopause can be associated with physical symptoms and risks. Hormone replacement therapy is available to decrease symptoms and risks. You should talk to your caregiver about whether hormone replacement therapy is right for you.  Use sunscreen. Apply sunscreen liberally and repeatedly throughout the day. You should seek shade when your shadow is shorter than you. Protect yourself by wearing long sleeves, pants, a wide-brimmed hat, and sunglasses year round, whenever you are outdoors.  Notify your caregiver of new moles or changes in moles, especially if there is a change in shape or color. Also notify your caregiver if a mole is larger than the size of a pencil eraser.  Stay current with your immunizations. Document Released: 10/10/2010 Document Revised: 07/22/2012 Document Reviewed: 10/10/2010 [facility] Patient Information 2014 [place].

## 2014-01-14 ENCOUNTER — Ambulatory Visit: Payer: 59 | Admitting: Licensed Clinical Social Worker

## 2014-01-14 ENCOUNTER — Telehealth: Payer: Self-pay | Admitting: *Deleted

## 2014-01-14 DIAGNOSIS — D497 Neoplasm of unspecified behavior of endocrine glands and other parts of nervous system: Secondary | ICD-10-CM

## 2014-01-14 LAB — URINALYSIS W MICROSCOPIC + REFLEX CULTURE
BILIRUBIN URINE: NEGATIVE
Bacteria, UA: NONE SEEN
Casts: NONE SEEN
Crystals: NONE SEEN
Glucose, UA: NEGATIVE mg/dL
Hgb urine dipstick: NEGATIVE
KETONES UR: NEGATIVE mg/dL
Leukocytes, UA: NEGATIVE
Nitrite: NEGATIVE
PROTEIN: NEGATIVE mg/dL
Specific Gravity, Urine: 1.021 (ref 1.005–1.030)
UROBILINOGEN UA: 0.2 mg/dL (ref 0.0–1.0)
pH: 6 (ref 5.0–8.0)

## 2014-01-14 NOTE — Telephone Encounter (Signed)
Message copied by Thamas Jaegers on Wed Jan 14, 2014  9:02 AM ------      Message from: Anastasio Auerbach      Created: Tue Jan 13, 2014  4:28 PM       Set up an appointment with our endocrinology reference patient diagnosed with adrenal tumor that is being watched and would like a second opinion as to what to do. ------

## 2014-01-14 NOTE — Telephone Encounter (Signed)
Referral placed for [place] office they will call to schedule.

## 2014-01-14 NOTE — Telephone Encounter (Signed)
Appointment 01/27/14 @ 2:15 pm

## 2014-01-27 ENCOUNTER — Ambulatory Visit (INDEPENDENT_AMBULATORY_CARE_PROVIDER_SITE_OTHER): Payer: 59 | Admitting: Internal Medicine

## 2014-01-27 ENCOUNTER — Encounter: Payer: Self-pay | Admitting: Internal Medicine

## 2014-01-27 VITALS — BP 124/68 | HR 79 | Temp 97.9°F | Resp 12 | Ht 65.25 in | Wt 191.0 lb

## 2014-01-27 DIAGNOSIS — E278 Other specified disorders of adrenal gland: Secondary | ICD-10-CM

## 2014-01-27 DIAGNOSIS — R7301 Impaired fasting glucose: Secondary | ICD-10-CM

## 2014-01-27 LAB — BASIC METABOLIC PANEL
BUN: 18 mg/dL (ref 6–23)
CHLORIDE: 101 meq/L (ref 96–112)
CO2: 30 mEq/L (ref 19–32)
Calcium: 9.3 mg/dL (ref 8.4–10.5)
Creatinine, Ser: 1.2 mg/dL (ref 0.4–1.2)
GFR: 51.07 mL/min — AB (ref >60.00)
Glucose, Bld: 116 mg/dL — ABNORMAL HIGH (ref 70–99)
POTASSIUM: 3.6 meq/L (ref 3.5–5.1)
Sodium: 138 mEq/L (ref 135–145)

## 2014-01-27 LAB — HEMOGLOBIN A1C: HEMOGLOBIN A1C: 5.8 % (ref 4.6–6.5)

## 2014-01-27 NOTE — Progress Notes (Signed)
Patient ID: Sherry Chapman, female   DOB: March 19, 1956, 58 y.o.   MRN: [id number]   HPI  Sherry Chapman is a 58 y.o.-year-old female, referred by her PCP, Dr.Fry, in consultation for an R adrenal incidentaloma.  In 09/2009, she was having frequent UTI s >> seen by Dr Dorina Hoyer >> CT scan >> adrenal incidentaloma. She did not have formal investigation for this and she is nervous and worried about possible adrenal hormone hypersecretion.  I reviewed the images of pt's previous CT scan (10/04/2013): ~0.7 cm R adrenal adenoma, ~8 HU  She has HTN, is on 3 BP meds, started on Propranolol in her 30s >> she had an episode of HTN 200/100 at 58 y/o. She has a tremor when she lays down at night, 2-3x a year. She has had elevated fasting glucose levels x4 years. They improved when she lost ~ 50 lbs 2 years ago on Weight watchers, but she regained 35 back. No spells with HAs, pallor and HTN. No h/o hypokalemia, but she takes a potassium supplement as she is on HCTZ. No purple, wide striae, central obesity or facial plethora. She has depression and anxiety >> both much improved on Wellbutrin.  She tells me she cannot take Prednisone >> arm numbness.  Her son has hypothyroidism; brother has [place] sd.; FH of DM2.  ROS: Constitutional: + both weight gain/loss, no fatigue, + hot flushes. Eyes: no blurry vision, no xerophthalmia ENT: no sore throat, no nodules palpated in throat, no dysphagia/odynophagia, no hoarseness Cardiovascular: no CP/SOB/palpitations/leg swelling Respiratory: no cough/SOB Gastrointestinal: no N/V/D/C/+ occas. heartburn Musculoskeletal: no muscle/joint aches Skin: no rashes Neurological: no tremors/numbness/tingling/dizziness Psychiatric: + both:  depression/anxiety  Past Medical History  Diagnosis Date  . Allergy   . Anxiety   . Back pain   . Benign neoplasm of adrenal gland   . Depression   . GERD (gastroesophageal reflux disease)   . Hyperlipidemia   . Hypertension   .  Restless leg syndrome   . Vertigo   . Hematuria, unspecified    Past Surgical History  Procedure Laterality Date  . Nasal sinus surgery    . Excision of dorsal ganglion from right wrist 06-05-2008 dr sypher    . Colonoscopy  05-18-07    per Dr. Deatra Ina, repeat in 10 yrs    History   Social History  . Marital Status: Married    Spouse Name: N/A    Number of Children: 0   Occupational History  . 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 Smoking status: Never Smoker   . Smokeless tobacco: Never Used  . Alcohol Use: No  . Drug Use: No  . Sexual Activity: Yes    Birth Control/ Protection: Post-menopausal   Current Outpatient Prescriptions on File Prior to Visit  Medication Sig Dispense Refill  . acetaminophen (TYLENOL) 325 MG tablet Take 325 mg by mouth every 6 (six) hours as needed.       . ALPRAZolam (XANAX) 0.25 MG tablet TAKE 1 TABLET BY MOUTH THREE TIMES DAILY AS NEEDED FOR ANXIETY  60 tablet  1  . amLODipine (NORVASC) 5 MG tablet TAKE ONE TABLET BY MOUTH DAILY  90 tablet  3  . buPROPion (WELLBUTRIN XL) 150 MG 24 hr tablet Take 1 tablet (150 mg total) by mouth daily.  30 tablet  2  . diphenhydrAMINE (SOMINEX) 25 MG tablet Take 25 mg by mouth as needed.      . hydrochlorothiazide (HYDRODIURIL) 25 MG tablet TAKE  ONE TABLET BY MOUTH DAILY  90 tablet  3  . Multiple Vitamin (MULTIVITAMIN) tablet Take 1 tablet by mouth daily.      . nebivolol (BYSTOLIC) 10 MG tablet TAKE 1 TABLET DAILY  90 tablet  3  . potassium chloride (K-DUR,KLOR-CON) 10 MEQ tablet Take 1 tablet (10 mEq total) by mouth daily.  90 tablet  1  . [DISCONTINUED] fluticasone (FLONASE) 50 MCG/ACT nasal spray 2 sprays by Nasal route daily.  16 g  11  . [DISCONTINUED] loratadine (CLARITIN) 10 MG tablet Take 10 mg by mouth daily.        . [DISCONTINUED] mometasone-formoterol (DULERA) 100-5 MCG/ACT AERO Inhale 2 puffs into the lungs 2 (two) times daily.  3 Inhaler  0  . [DISCONTINUED]  propranolol (INDERAL LA) 80 MG 24 hr capsule Take 1 capsule (80 mg total) by mouth daily.  90 capsule  3  . [DISCONTINUED] ranitidine (ZANTAC) 150 MG tablet Take 1 tablet (150 mg total) by mouth at bedtime.  30 tablet  1   No current facility-administered medications on file prior to visit.   Allergies  Allergen Reactions  . Prednisone Anxiety    Insomnia, Hypersensitivity  . Clarithromycin     Biaxin - REACTION: Nausea  . Clindamycin/Lincomycin Diarrhea   Family History  Problem Relation Age of Onset  . Heart disease Mother   . Hypertension Mother   . Diabetes Mother   . Alcohol abuse Father   . Coronary artery disease Father   . Hypertension Father   . Hypertension Sister   . Diabetes Sister   . Hypertension Brother   . Diabetes Brother    PE: BP 124/68  Pulse 79  Temp(Src) 97.9 F (36.6 C) (Oral)  Resp 12  Ht 5' 5.25" (1.657 m)  Wt 191 lb (86.637 kg)  BMI 31.55 kg/m2  SpO2 95% Wt Readings from Last 3 Encounters:  01/27/14 191 lb (86.637 kg)  01/13/14 189 lb (85.73 kg)  10/29/13 187 lb (84.823 kg)   Constitutional: overweight, in NAD Eyes: PERRLA, EOMI, no exophthalmos, no lid lag, no stare ENT: moist mucous membranes, no thyromegaly, no cervical lymphadenopathy Cardiovascular: RRR, No MRG Respiratory: CTA B Gastrointestinal: abdomen soft, NT, ND, BS+ Musculoskeletal: no deformities, strength intact in all 4 Skin: moist, warm, no rashes Neurological: no tremor with outstretched hands, DTR normal in all 4  ASSESSMENT: 1. R adrenal adenoma - ~0.7 cm - ~8 HU  2. Elevated fasting Glu  PLAN:  1. Patient with a small (<1 cm) R adrenal nodule discovered incidentally. We reviewed the images of her 2011 CT along with the pt. The incidentaloma is very small and isoechoic with the liver, which is very reassuring. - We discussed about the fact that there are 3 possible scenarios: - A nonfunctioning adrenal nodule - A functioning adrenal adenoma - which can  hypersecrete catecholamines/metanephrines, cortisol, or aldosterone - Adrenal cancer/metastasis We do not have blood tests to check for cancer, but the best indicator is lack of change in size and appearance over time.  - To differentiate between a functioning and a nonfunctioning adrenal nodule, we'll need to rule out hypersecretion by checking the following tests  - dexamethasone suppression test to rule out Cushing syndrome (6% of adrenal incidentalomas) - this is a problem because patient tells me she cannot tolerate steroids >> will need to check a 24h urine free cortisol - Plasma fractionated metanephrines and catecholamines to rule out pheochromocytoma (3% of adrenal incidentalomas) - Plasma renin activity and aldosterone  level to rule out primary hyperaldosteronism (0.6% of adrenal incidentalomas) - I ordered the above tests today.  - We discussed about the need for another CT scan, 4.5 years after the previous. This time, we will check a CT with adrenal protocol. If there is no growth, no further imaging necessary. I will need to order it with and without contrast, if the Hounsfield units are low and the lesion again appears benign, we might not need to get the contrasted CT for washout.  - we discussed about f/u:   hormonal testing yearly for 5 years   CT scans yearly x1-2 >> will stop if no growth on the new CT - I explained all the above to the patient, and she agrees with the plan. - I will see her back in a year  2. Elevated fasting Glu - will check a HbA1c - discuss possible dx of Prediabetes and how to prevent it from evolving into DM  CC: ObGyn: Dr Phineas Real    Component     Latest Ref Rng 01/27/2014  Epinephrine      34  Norepinephrine      619  Dopamine      REPORT  Catecholamines, Total      653  PRA LC/MS/MS     0.25 - 5.82 ng/mL/h 0.11 (L)  ALDO / PRA Ratio     0.9 - 28.9 Ratio 36.4 (H)  ALDOSTERONE      4  Metanephrine, Free     <=57 pg/mL 29   Normetanephrine, Free     <=148 pg/mL 127  Total Metanephrines-Plasma     <=205 pg/mL 156  Hgb A1c MFr Bld     4.6 - 6.5 % 5.8   HbA1c is at the lower limit for prediabetes - at these low levels, the test is not very accurate.  The labs so far do not show signs of pheochromocytoma (the catecholamines and metanephrines were normal) or an aldosterone producing tumor (aldosterone not high, potassium not low). I am waiting for the CT results (this is scheduled for 02/12/2014) and for the urine cortisol. We will let her know when these are back.

## 2014-01-27 NOTE — Patient Instructions (Signed)
Please stop at the lab.  Please collect a 24h urine. Patient information (Up-to-Date): Collection of a 24-hour urine specimen  - You should collect every drop of urine during each 24-hour period. It does not matter how much or little urine is passed each time, as long as every drop is collected. - Begin the urine collection in the morning after you wake up, after you have emptied your bladder for the first time. - Urinate (empty the bladder) for the first time and flush it down the toilet. Note the exact time (eg, 6:15 AM). You will begin the urine collection at this time. - Collect every drop of urine during the day and night in an empty collection bottle. Store the bottle at room temperature or in the refrigerator. - If you need to have a bowel movement, any urine passed with the bowel movement should be collected. Try not to include feces with the urine collection. If feces does get mixed in, do not try to remove the feces from the urine collection bottle. - Finish by collecting the first urine passed the next morning, adding it to the collection bottle. This should be within ten minutes before or after the time of the first morning void on the first day (which was flushed). In this example, you would try to void between 6:05 and 6:25 on the second day. - If you need to urinate one hour before the final collection time, drink a full glass of water so that you can void again at the appropriate time. If you have to urinate 20 minutes before, try to hold the urine until the proper time. - Please note the exact time of the final collection, even if it is not the same time as when collection began on day 1. - The bottle(s) may be kept at room temperature for a day or two, but should be kept cool or refrigerated for longer periods of time.  Please try to join MyChart for easier communication.  I ordered your CT scan to be done at [facility] downstairs. You will be called to schedule this  appt.  Please return in 1 year.

## 2014-01-28 ENCOUNTER — Ambulatory Visit: Payer: Self-pay

## 2014-01-30 LAB — CATECHOLAMINES, FRACTIONATED, PLASMA
Catecholamines, Total: 653 pg/mL
Epinephrine: 34 pg/mL
NOREPINEPHRINE: 619 pg/mL

## 2014-01-31 LAB — METANEPHRINES, PLASMA
METANEPHRINE FREE: 29 pg/mL (ref <=57)
Normetanephrine, Free: 127 pg/mL (ref <=148)
Total Metanephrines-Plasma: 156 pg/mL (ref <=205)

## 2014-02-04 ENCOUNTER — Inpatient Hospital Stay: Admission: RE | Admit: 2014-02-04 | Payer: Self-pay | Source: Ambulatory Visit

## 2014-02-05 LAB — ALDOSTERONE + RENIN ACTIVITY W/ RATIO
ALDO / PRA RATIO: 36.4 ratio — AB (ref 0.9–28.9)
Aldosterone: 4 ng/dL
PRA LC/MS/MS: 0.11 ng/mL/h — AB (ref 0.25–5.82)

## 2014-02-09 ENCOUNTER — Encounter: Payer: Self-pay | Admitting: Internal Medicine

## 2014-02-11 ENCOUNTER — Telehealth: Payer: Self-pay | Admitting: Internal Medicine

## 2014-02-11 NOTE — Telephone Encounter (Signed)
Patient would like to know the results of her lab work.

## 2014-02-11 NOTE — Telephone Encounter (Signed)
Patient is calling because she want to cancel her appt with imaging, she has called 4 times, and have been very difficult. She would like for you to call her.

## 2014-02-11 NOTE — Telephone Encounter (Signed)
Returned pt's call.

## 2014-02-12 ENCOUNTER — Inpatient Hospital Stay: Admission: RE | Admit: 2014-02-12 | Payer: Self-pay | Source: Ambulatory Visit

## 2014-02-26 ENCOUNTER — Telehealth: Payer: Self-pay | Admitting: Internal Medicine

## 2014-02-26 ENCOUNTER — Encounter: Payer: Self-pay | Admitting: Internal Medicine

## 2014-02-26 ENCOUNTER — Ambulatory Visit (INDEPENDENT_AMBULATORY_CARE_PROVIDER_SITE_OTHER): Payer: 59 | Admitting: Internal Medicine

## 2014-02-26 VITALS — BP 142/90 | HR 70 | Temp 97.8°F | Ht 65.5 in | Wt 192.8 lb

## 2014-02-26 DIAGNOSIS — R05 Cough: Secondary | ICD-10-CM

## 2014-02-26 DIAGNOSIS — R059 Cough, unspecified: Secondary | ICD-10-CM

## 2014-02-26 MED ORDER — ACETAMINOPHEN-CODEINE #3 300-30 MG PO TABS: ORAL_TABLET | ORAL | Status: DC

## 2014-02-26 NOTE — Patient Instructions (Signed)
For cough add tylenol #3 one every 4 hours if needed  Always in event of cough >>Try prilosec otc 20mg   Take 30-60 min before first meal of the day and Zantac 150 mg  one bedtime until cough is completely gone for at least a week without the need for cough suppression  ause).   GERD (REFLUX)  is an extremely common cause of respiratory symptoms just like yours , many times with no obvious heartburn at all.    It can be treated with medication, but also with lifestyle changes including avoidance of late meals, excessive alcohol, smoking cessation, and avoid fatty foods, chocolate, peppermint, colas, red wine, and acidic juices such as orange juice.  NO MINT OR MENTHOL PRODUCTS SO NO COUGH DROPS  USE SUGARLESS CANDY INSTEAD (Jolley ranchers or Stover's or Life Savers) or even ice chips will also do - the key is to swallow to prevent all throat clearing. NO OIL BASED VITAMINS - use powdered substitutes.   Ok to stop dulera if not helping fiinish your cipro   See [place] back in 2 weeks if not completely back to your normal self

## 2014-02-26 NOTE — Assessment & Plan Note (Addendum)
Recurrent pattern of upper airway cough / pseudowheeze with months free of symptoms on no maint rx at all and no evidence of true asthma on exam  I had an extended discussion with the patient today lasting 15 to 20 minutes of a 25 minute visit on the following issues:  Explained the natural history of uri and why it's necessary in patients at risk to treat GERD aggressively - at least  short term -   to reduce risk of evolving cyclical cough initially  triggered by epithelial injury and a heightened sensitivty to the effects of any upper airway irritants,  most importantly acid - related - then perpetuated by epithelial injury related to the cough itself as the upper airway collapses on itself.  That is, the more sensitive the epithelium becomes once it is damaged by the virus, the more the ensuing irritability> the more the cough, the more the secondary reflux (especially in those prone to reflux) the more the irritation of the sensitive mucosa and so on in a  Classic cyclical pattern.    No need for dulera in this setting     Each maintenance medication was reviewed in detail including most importantly the difference between maintenance and as needed and under what circumstances the prns are to be used.  Please see instructions for details which were reviewed in writing and the patient given a copy.

## 2014-02-26 NOTE — Progress Notes (Signed)
Subjective:    Patient ID: Sherry Chapman, female    DOB: 09/25/1955  MRN: [id number]    Brief patient profile:  67 yowf never smoker with h/o sinus problems in the 1980s took allergy shots 1980's then subsequently colds linger in nose but starting in mid 2000s typcially develops cough sev times a year assoc with sinus flares requiring abx and "prednisone but makes her batty" referred to pulmonary by UC 06/19/2011 for refractory cough.    History of Present Illness  06/19/2011 1st pulmonary eval cc cough acute onset with st, sinus drainage started out clear abruptly on Mar 30 2012 > eval by UC  And Dr Sarajane Jews and Dr Ernesto Rutherford with yellow mucus finally better p started on clindomycin by Ernesto Rutherford so severe gags vomits and urinates - improving slowly prior to OV  And really purpose of this ov is to prevent it from coming back again.  Triggers are always assoc with she perceives to be uri with no seasonal or environmental pattern x "I get what/ when all the kids get it". Albuterol helps very little. Maintained on inderol x decades for hbp rec Stop inderal and stop bystolic 10 mg one daily instead See Reflux flyer Try zegrid   Take 30-60 min before first meal of the day and  one bedtime until cough is completely gone for at least a week without the need for cough suppression Take mucinex dm up to 1200 mg  every 12 hours and supplement if needed with  tramadol 50 mg up to 2 every 4 hours   GERD diet     12/19/11-  Pt seen by dr Annamaria Boots:  ACUTE VISIT: MW patient-wheezing and coughing through the night-occasionaly will be yellow in color; was unable to locate her rescue inhaler. Burning in chest in neck/throat area-  Has used Clorcedin OTC and Zantac as well. Cough, chest rattle and wheeze x4 days. At onset had malaise and aching. History the prednisone caused mental agitation and panic. Dulera made her anxious. rec Refill script for rescue albuterol inhaler Script to hold for antibiotic Zpak to take if  infection gets worse Ok to take your montelukast- one every day Ok to take your tussionex as instructed on label- twice daily if needed Ok to take Mucinex DM if needed to calm cough with little sedation Take Zantac 2 or 3 times daily, or Zegerid once daily  to block stomach acid production    02/26/2014 acute  ov/Wert re: recurrent severe uacs  Chief Complaint  Patient presents with  . Acute Visit    Pt c/o sore throat, sinus congestion and cough for the past 6 days. Cough is occ prod with minimal clear sputum.   very rare need for any rescue off singulair x over a year and rare zantac use Symptoms flareed first with st, nasal congestion rx with cipro x 02/23/14 and dulera/ no better / hacking cough but no gag/vomit yet   No obvious day to day or daytime variabilty or assoc sob  or cp or chest tightness, subjective wheeze or overt  hb symptoms. No unusual exp hx or h/o childhood pna/ asthma or knowledge of premature birth.  Sleeping ok without nocturnal  or early am exacerbation  of respiratory  c/o's or need for noct saba. Also denies any obvious fluctuation of symptoms with weather or environmental changes or other aggravating or alleviating factors except as outlined above   Current Medications, Allergies, Complete Past Medical History, Past Surgical History, Family History, and Social  History were reviewed in [place] record.  ROS  The following are not active complaints unless bolded sore throat, dysphagia, dental problems, itching, sneezing,  nasal congestion or excess/ purulent secretions, ear ache,   fever, chills, sweats, unintended wt loss, pleuritic or exertional cp, hemoptysis,  orthopnea pnd or leg swelling, presyncope, palpitations, heartburn, abdominal pain, anorexia, nausea, vomiting, diarrhea  or change in bowel or urinary habits, change in stools or urine, dysuria,hematuria,  rash, arthralgias, visual complaints, headache, numbness weakness or ataxia  or problems with walking or coordination,  change in mood/affect or memory.          Objective:     amb wf nad with prominent pseudowheeze   Wt Readings from Last 3 Encounters:  02/26/14 192 lb 12.8 oz (87.454 kg)  01/27/14 191 lb (86.637 kg)  01/13/14 189 lb (85.73 kg)    Vital signs reviewed   HEENT: nl dentition, turbinates, and orophanx. Nl external ear canals without cough reflex   NECK :  without JVD/Nodes/TM/ nl carotid upstrokes bilaterally   LUNGS: no acc muscle use, clear to A and P bilaterally without cough on insp or exp maneuvers   CV:  RRR  no s3 or murmur or increase in P2, no edema   ABD:  soft and nontender with nl excursion in the supine position. No bruits or organomegaly, bowel sounds nl  MS:  warm without deformities, calf tenderness, cyanosis or clubbing  SKIN: warm and dry without lesions    NEURO:  alert, approp, no deficits        Assessment & Plan:

## 2014-02-26 NOTE — Telephone Encounter (Signed)
Called, spoke with pt.  She was seen by Dr. Melvyn Novas today and was advised ok to stop dulera if not helping.  Pt reports she placed call before OV and no longer needs this rx.  She voiced no further questions or concerns at this time.

## 2014-03-25 ENCOUNTER — Other Ambulatory Visit: Payer: Self-pay | Admitting: Family Medicine

## 2014-04-10 ENCOUNTER — Other Ambulatory Visit: Payer: Self-pay | Admitting: Family Medicine

## 2014-04-27 ENCOUNTER — Other Ambulatory Visit: Payer: Self-pay

## 2014-04-27 MED ORDER — BUPROPION HCL ER (XL) 150 MG PO TB24: ORAL_TABLET | ORAL | Status: DC

## 2014-04-27 NOTE — Telephone Encounter (Signed)
Rx request for Bupropion HCL XL 150 mg tablets- Take 1 tablet by mouth daily #90  Pharm:  [facility]  [facility]

## 2014-04-30 ENCOUNTER — Other Ambulatory Visit: Payer: Self-pay | Admitting: Family Medicine

## 2014-04-30 ENCOUNTER — Telehealth: Payer: Self-pay | Admitting: Family Medicine

## 2014-04-30 NOTE — Telephone Encounter (Signed)
Pt is waiting on mailorder. Pt needs new rx  bystolic 10 mg #14 call into [facility] on [address]

## 2014-04-30 NOTE — Telephone Encounter (Signed)
I called in a 30 day supply to local pharmacy.

## 2014-05-27 ENCOUNTER — Ambulatory Visit: Payer: 59 | Admitting: Licensed Clinical Social Worker

## 2014-06-05 ENCOUNTER — Ambulatory Visit (INDEPENDENT_AMBULATORY_CARE_PROVIDER_SITE_OTHER): Payer: 59 | Admitting: Licensed Clinical Social Worker

## 2014-06-05 DIAGNOSIS — F4323 Adjustment disorder with mixed anxiety and depressed mood: Secondary | ICD-10-CM

## 2014-06-11 ENCOUNTER — Other Ambulatory Visit: Payer: Self-pay | Admitting: *Deleted

## 2014-06-12 MED ORDER — ALPRAZOLAM 0.25 MG PO TABS: 0.2500 mg | ORAL_TABLET | Freq: Three times a day (TID) | ORAL | Status: DC | PRN

## 2014-06-12 NOTE — Telephone Encounter (Signed)
Call in #90 with 2 rf 

## 2014-06-16 ENCOUNTER — Other Ambulatory Visit: Payer: Self-pay | Admitting: Family Medicine

## 2014-06-23 ENCOUNTER — Ambulatory Visit (INDEPENDENT_AMBULATORY_CARE_PROVIDER_SITE_OTHER): Payer: 59 | Admitting: Licensed Clinical Social Worker

## 2014-06-23 DIAGNOSIS — F4323 Adjustment disorder with mixed anxiety and depressed mood: Secondary | ICD-10-CM | POA: Diagnosis not present

## 2014-07-09 ENCOUNTER — Encounter: Payer: Self-pay | Admitting: Family Medicine

## 2014-07-09 ENCOUNTER — Other Ambulatory Visit (INDEPENDENT_AMBULATORY_CARE_PROVIDER_SITE_OTHER): Payer: 59

## 2014-07-09 DIAGNOSIS — Z0000 Encounter for general adult medical examination without abnormal findings: Secondary | ICD-10-CM

## 2014-07-09 LAB — HEPATIC FUNCTION PANEL
ALK PHOS: 65 U/L (ref 39–117)
ALT: 22 U/L (ref 0–35)
AST: 17 U/L (ref 0–37)
Albumin: 4 g/dL (ref 3.5–5.2)
Bilirubin, Direct: 0.1 mg/dL (ref 0.0–0.3)
TOTAL PROTEIN: 7 g/dL (ref 6.0–8.3)
Total Bilirubin: 0.4 mg/dL (ref 0.2–1.2)

## 2014-07-09 LAB — BASIC METABOLIC PANEL
BUN: 13 mg/dL (ref 6–23)
CHLORIDE: 106 meq/L (ref 96–112)
CO2: 30 meq/L (ref 19–32)
Calcium: 9.6 mg/dL (ref 8.4–10.5)
Creatinine, Ser: 1.01 mg/dL (ref 0.40–1.20)
GFR: 59.83 mL/min — ABNORMAL LOW (ref >60.00)
Glucose, Bld: 122 mg/dL — ABNORMAL HIGH (ref 70–99)
Potassium: 4.6 mEq/L (ref 3.5–5.1)
SODIUM: 143 meq/L (ref 135–145)

## 2014-07-09 LAB — CBC WITH DIFFERENTIAL/PLATELET
Basophils Absolute: 0 10*3/uL (ref 0.0–0.1)
Basophils Relative: 0.4 % (ref 0.0–3.0)
Eosinophils Absolute: 0.1 10*3/uL (ref 0.0–0.7)
Eosinophils Relative: 2.5 % (ref 0.0–5.0)
HCT: 39.3 % (ref 36.0–46.0)
Hemoglobin: 13.3 g/dL (ref 12.0–15.0)
LYMPHS ABS: 1.6 10*3/uL (ref 0.7–4.0)
Lymphocytes Relative: 26.3 % (ref 12.0–46.0)
MCHC: 33.8 g/dL (ref 30.0–36.0)
MCV: 87.3 fl (ref 78.0–100.0)
MONOS PCT: 5.9 % (ref 3.0–12.0)
Monocytes Absolute: 0.3 10*3/uL (ref 0.1–1.0)
NEUTROS PCT: 64.9 % (ref 43.0–77.0)
Neutro Abs: 3.8 10*3/uL (ref 1.4–7.7)
Platelets: 280 10*3/uL (ref 150.0–400.0)
RBC: 4.51 Mil/uL (ref 3.87–5.11)
RDW: 13.4 % (ref 11.5–15.5)
WBC: 5.9 10*3/uL (ref 4.0–10.5)

## 2014-07-09 LAB — LIPID PANEL
CHOL/HDL RATIO: 4
Cholesterol: 200 mg/dL (ref 0–200)
HDL: 48.3 mg/dL (ref >39.00)
LDL Cholesterol: 125 mg/dL — ABNORMAL HIGH (ref 0–99)
NONHDL: 151.7
Triglycerides: 136 mg/dL (ref 0.0–149.0)
VLDL: 27.2 mg/dL (ref 0.0–40.0)

## 2014-07-09 LAB — POCT URINALYSIS DIPSTICK
Bilirubin, UA: NEGATIVE
GLUCOSE UA: NEGATIVE
KETONES UA: NEGATIVE
Nitrite, UA: NEGATIVE
PH UA: 6.5
Protein, UA: NEGATIVE
RBC UA: NEGATIVE
SPEC GRAV UA: 1.015
Urobilinogen, UA: 0.2

## 2014-07-09 LAB — TSH: TSH: 3.73 u[IU]/mL (ref 0.35–4.50)

## 2014-07-14 ENCOUNTER — Encounter: Payer: Self-pay | Admitting: Family Medicine

## 2014-07-28 ENCOUNTER — Encounter: Payer: Self-pay | Admitting: Family Medicine

## 2014-07-28 ENCOUNTER — Ambulatory Visit (INDEPENDENT_AMBULATORY_CARE_PROVIDER_SITE_OTHER): Payer: 59 | Admitting: Family Medicine

## 2014-07-28 VITALS — BP 131/81 | HR 78 | Temp 97.7°F | Ht 65.5 in | Wt 199.0 lb

## 2014-07-28 DIAGNOSIS — Z23 Encounter for immunization: Secondary | ICD-10-CM

## 2014-07-28 DIAGNOSIS — R739 Hyperglycemia, unspecified: Secondary | ICD-10-CM

## 2014-07-28 DIAGNOSIS — Z0000 Encounter for general adult medical examination without abnormal findings: Secondary | ICD-10-CM

## 2014-07-28 NOTE — Progress Notes (Signed)
   Subjective:    Patient ID: Sherry Chapman, female    DOB: 09/09/1955, 59 y.o.   MRN: [id number]  HPI 59 yr old female for a cpx. She feels well in general. Her depression has improved now that the days are longer and she can get outside to work in her yard.    Review of Systems  Constitutional: Negative.   HENT: Negative.   Eyes: Negative.   Respiratory: Negative.   Cardiovascular: Negative.   Gastrointestinal: Negative.   Genitourinary: Negative for dysuria, urgency, frequency, hematuria, flank pain, decreased urine volume, enuresis, difficulty urinating, pelvic pain and dyspareunia.  Musculoskeletal: Negative.   Skin: Negative.   Neurological: Negative.   Psychiatric/Behavioral: Negative.        Objective:   Physical Exam  Constitutional: She is oriented to person, place, and time. She appears well-developed and well-nourished. No distress.  HENT:  Head: Normocephalic and atraumatic.  Right Ear: External ear normal.  Left Ear: External ear normal.  Nose: Nose normal.  Mouth/Throat: Oropharynx is clear and moist. No oropharyngeal exudate.  Eyes: Conjunctivae and EOM are normal. Pupils are equal, round, and reactive to light. No scleral icterus.  Neck: Normal range of motion. Neck supple. No JVD present. No thyromegaly present.  Cardiovascular: Normal rate, regular rhythm, normal heart sounds and intact distal pulses.  Exam reveals no gallop and no friction rub.   No murmur heard. EKG showed stable LAFB   Pulmonary/Chest: Effort normal and breath sounds normal. No respiratory distress. She has no wheezes. She has no rales. She exhibits no tenderness.  Abdominal: Soft. Bowel sounds are normal. She exhibits no distension and no mass. There is no tenderness. There is no rebound and no guarding.  Musculoskeletal: Normal range of motion. She exhibits no edema or tenderness.  Lymphadenopathy:    She has no cervical adenopathy.  Neurological: She is alert and oriented to person,  place, and time. She has normal reflexes. No cranial nerve deficit. She exhibits normal muscle tone. Coordination normal.  Skin: Skin is warm and dry. No rash noted. No erythema.  Psychiatric: She has a normal mood and affect. Her behavior is normal. Judgment and thought content normal.          Assessment & Plan:  Well exam. Since her fasting glucose is creeping up we will get another A1c today. She plans to rejoin [employer] so she can lose some weight.

## 2014-07-28 NOTE — Progress Notes (Signed)
Pre visit review using our clinic review tool, if applicable. No additional management support is needed unless otherwise documented below in the visit note. 

## 2014-07-29 LAB — HEMOGLOBIN A1C: Hgb A1c MFr Bld: 6 % (ref 4.6–6.5)

## 2014-08-03 ENCOUNTER — Encounter: Payer: Self-pay | Admitting: Family Medicine

## 2014-08-18 ENCOUNTER — Other Ambulatory Visit: Payer: Self-pay

## 2014-08-18 MED ORDER — HYDROCHLOROTHIAZIDE 25 MG PO TABS: 25.0000 mg | ORAL_TABLET | Freq: Every day | ORAL | Status: DC

## 2014-08-19 ENCOUNTER — Ambulatory Visit: Payer: 59 | Admitting: Licensed Clinical Social Worker

## 2014-10-04 ENCOUNTER — Encounter: Payer: Self-pay | Admitting: Family Medicine

## 2014-10-05 ENCOUNTER — Encounter: Payer: Self-pay | Admitting: Gynecology

## 2014-10-07 NOTE — Telephone Encounter (Signed)
Patient sent message to my chart for a request to be excused from jury duty. Please advise

## 2014-10-08 NOTE — Telephone Encounter (Signed)
The note was written  

## 2014-10-09 NOTE — Telephone Encounter (Signed)
I did write this note

## 2014-10-15 ENCOUNTER — Other Ambulatory Visit: Payer: Self-pay | Admitting: Family Medicine

## 2014-10-15 NOTE — Telephone Encounter (Signed)
Can we refill this? 

## 2014-10-26 ENCOUNTER — Ambulatory Visit (INDEPENDENT_AMBULATORY_CARE_PROVIDER_SITE_OTHER): Payer: 59

## 2014-10-26 ENCOUNTER — Encounter: Payer: Self-pay | Admitting: Podiatry

## 2014-10-26 ENCOUNTER — Telehealth: Payer: Self-pay | Admitting: *Deleted

## 2014-10-26 ENCOUNTER — Ambulatory Visit (INDEPENDENT_AMBULATORY_CARE_PROVIDER_SITE_OTHER): Payer: 59 | Admitting: Podiatry

## 2014-10-26 VITALS — BP 133/77 | HR 62 | Resp 15 | Ht 65.5 in | Wt 189.0 lb

## 2014-10-26 DIAGNOSIS — S92911A Unspecified fracture of right toe(s), initial encounter for closed fracture: Secondary | ICD-10-CM

## 2014-10-26 DIAGNOSIS — M79671 Pain in right foot: Secondary | ICD-10-CM

## 2014-10-26 NOTE — Telephone Encounter (Signed)
Pt states she hit her old surgery foot on a nighttable and it is painful and black and blue.  I transferred to schedulers for an appt today.

## 2014-10-26 NOTE — Progress Notes (Signed)
   Subjective:    Patient ID: Sherry Chapman, female    DOB: 03-08-1956, 59 y.o.   MRN: [id number]  HPI Patient presents the office today with concerns of right big toe pain. She states of early Saturday morning she hit her toe on the nightstand. Since that she's had swelling and bruising to the entire toe. She denies any other injuries at the time of the accident. She's had no prior treatment. She states that she has not noticed any blood underneath the toenail a toenails not loose. No other complaints at this time.   Review of Systems  Gastrointestinal: Positive for abdominal pain.  Musculoskeletal: Positive for gait problem.  All other systems reviewed and are negative.      Objective:   Physical Exam AAO 3, NAD DP/PT pulses palpable, CRT less than 3 seconds Protective sensation intact with Simms Weinstein monofilament, vibratory sensation intact, Achilles tendon reflex intact. There is edema to the right hallux of the distal aspect of the level the MTPJ. There is tenderness to palpation around the distal phalanx of the hallux. There is ecchymosis to the majority of the toe. The toenail is firmly appeared to the underlying nail bed and there is no subungual hematoma. There is no pain with first MTPJ range of motion or to the first metatarsal or other areas of the foot bilaterally. No other areas of edema, erythema, increase in warmth. There is no pain with calf compression, swelling, warmth, erythema No open lesions or pre-ulcer lesions identified bilaterally.     Assessment & Plan:  59 year old female with right hallux contusion, likely fracture -X-rays were obtained and reviewed with the patient.  -Treatment options discussed including all alternatives, risks, and complications -Dispensed surgical shoe. -Ice and elevation -Follow-up 2 weeks or sooner if any problems arise. In the meantime, encouraged to call the office with any questions, concerns, change in symptoms.  *X-ray next  appt  Celesta Gentile, DPM

## 2014-10-26 NOTE — Patient Instructions (Signed)

## 2014-11-09 ENCOUNTER — Ambulatory Visit (INDEPENDENT_AMBULATORY_CARE_PROVIDER_SITE_OTHER): Payer: 59 | Admitting: Podiatry

## 2014-11-09 ENCOUNTER — Encounter: Payer: Self-pay | Admitting: Podiatry

## 2014-11-09 ENCOUNTER — Ambulatory Visit (INDEPENDENT_AMBULATORY_CARE_PROVIDER_SITE_OTHER): Payer: 59

## 2014-11-09 VITALS — BP 135/80 | HR 64 | Resp 12

## 2014-11-09 DIAGNOSIS — M205X1 Other deformities of toe(s) (acquired), right foot: Secondary | ICD-10-CM | POA: Diagnosis not present

## 2014-11-09 DIAGNOSIS — S92911A Unspecified fracture of right toe(s), initial encounter for closed fracture: Secondary | ICD-10-CM

## 2014-11-09 NOTE — Progress Notes (Signed)
Patient ID: Sherry Chapman, female   DOB: 1956/03/05, 59 y.o.   MRN: [id number]  Subjective: 59 year old female presents the office they for follow-up evaluation of right foot pain along the hallux. She states that her pain is significantly improved compared to what it was last appointment. She does have some discomfort when she tries to walk without the surgical shoe at times. She denies any swelling or redness overlying the area. She denies any acute changes his last appointment and no other complaints at this time.  Objective: AAO 3, NAD DP/PT pulses palpable, CRT less than 3 seconds Protective sensation intact with Simms Weinstein monofilament There is no tenderness palpation along the right hallux at this time. There is no pain with MTPJ range of motion although the range of motion is somewhat restricted and there is exostosis present on the dorsal aspect of the joint. There is no overlying edema, erythema, increase in warmth. No other areas of tenderness bilateral lower extremities. No open lesions or pre-ulcerative lesions identified bilaterally. There is no pain with calf compression, swelling, warmth, erythema.   Assessment : 59 year old female with resolving right hallux pain ; hallux limitus   Plan: -X-rays were obtained and reviewed with the patient.  -Treatment options discussed including all alternatives, risks, and complications -At this time she decided transition out of the surgical shoe and return to a regular shoe as tolerated. There is any increase in pain to return the surgical shoe and call the office. -She certainly some reoccurrence of the bone spurs on The joint. Continue to monitor the area closely. Discussed with her orthotic with a Morton's extension however she wishes to hold off on at this time. -Follow-up is symptoms not completely resolved in 4 weeks or sooner if any problems arise. In the meantime, encouraged to call the office with any questions, concerns, change in  symptoms.   Celesta Gentile, DPM

## 2014-11-16 ENCOUNTER — Other Ambulatory Visit: Payer: Self-pay | Admitting: Family Medicine

## 2014-11-17 ENCOUNTER — Other Ambulatory Visit: Payer: Self-pay

## 2014-12-07 ENCOUNTER — Ambulatory Visit: Payer: 59 | Admitting: Podiatry

## 2015-01-04 ENCOUNTER — Ambulatory Visit: Payer: 59 | Admitting: Podiatry

## 2015-01-19 ENCOUNTER — Ambulatory Visit (INDEPENDENT_AMBULATORY_CARE_PROVIDER_SITE_OTHER): Payer: 59 | Admitting: Licensed Clinical Social Worker

## 2015-01-19 DIAGNOSIS — F4323 Adjustment disorder with mixed anxiety and depressed mood: Secondary | ICD-10-CM | POA: Diagnosis not present

## 2015-01-29 ENCOUNTER — Ambulatory Visit (INDEPENDENT_AMBULATORY_CARE_PROVIDER_SITE_OTHER): Payer: 59 | Admitting: Gynecology

## 2015-01-29 ENCOUNTER — Encounter: Payer: Self-pay | Admitting: Gynecology

## 2015-01-29 ENCOUNTER — Other Ambulatory Visit (HOSPITAL_COMMUNITY)
Admission: RE | Admit: 2015-01-29 | Discharge: 2015-01-29 | Disposition: A | Discharge status: Home / Self Care | Payer: 59 | Source: Ambulatory Visit | Attending: Gynecology | Admitting: Gynecology

## 2015-01-29 VITALS — BP 130/80 | Ht 65.0 in | Wt 193.0 lb

## 2015-01-29 DIAGNOSIS — Z124 Encounter for screening for malignant neoplasm of cervix: Secondary | ICD-10-CM | POA: Diagnosis not present

## 2015-01-29 DIAGNOSIS — Z01419 Encounter for gynecological examination (general) (routine) without abnormal findings: Secondary | ICD-10-CM | POA: Diagnosis not present

## 2015-01-29 DIAGNOSIS — N951 Menopausal and female climacteric states: Secondary | ICD-10-CM

## 2015-01-29 NOTE — Addendum Note (Signed)
Addended by: Nelva Nay on: 01/29/2015 04:24 PM   Modules accepted: Orders

## 2015-01-29 NOTE — Progress Notes (Signed)
Sherry Chapman [birth year] [id number]        59 y.o.  [id number] for annual exam.  Overall doing well. Several issues noted below.  Past medical history,surgical history, problem list, medications, allergies, family history and social history were all reviewed and documented as reviewed in the EPIC chart.  ROS:  Performed with pertinent positives and negatives included in the history, assessment and plan.   Additional significant findings :  none   Exam: [name] Vitals:   01/29/15 1530  BP: 130/80  Height: 5\' 5"  (1.651 m)  Weight: 193 lb (87.544 kg)   General appearance:  Normal affect, orientation and appearance. Skin: Grossly normal HEENT: Without gross lesions.  No cervical or supraclavicular adenopathy. Thyroid normal.  Lungs:  Clear without wheezing, rales or rhonchi Cardiac: RR, without RMG Abdominal:  Soft, nontender, without masses, guarding, rebound, organomegaly or hernia Breasts:  Examined lying and sitting without masses, retractions, discharge or axillary adenopathy. Pelvic:  Ext/BUS/vagina with atrophic changes  Cervix with atrophic changes. Pap smear done  Uterus anteverted, normal size, shape and contour, midline and mobile nontender   Adnexa  Without masses or tenderness    Anus and perineum  Normal   Rectovaginal  Normal sphincter tone without palpated masses or tenderness.    Assessment/Plan:  60 y.o. [id number] female for annual exam.   1. Postmenopausal/atrophic genital changes. Patient is having some night sweats but overall doing well. We had discussed possible HRT last year but she has declined this. She's not interested in it at this point. We'll try OTC soy based products to see if it does not help. No issues as far as vaginal dryness or any vaginal bleeding. Continue to monitor and call if any vaginal bleeding. 2. Mammography 09/2014. Continue with annual mammography. SBE monthly review. 3. Pap smear 2013. Pap smear done today. No history of  significant abnormal Pap smears. 4. Colonoscopy 2009. Repeat at their recommended interval. 5. DEXA never. We discussed scheduling at age 68. Increased calcium vitamin D reviewed. 6. Health maintenance. No routine lab work done as she has this done at Dr. Barbie Banner office. Follow up 1 year, sooner as needed.   Anastasio Auerbach MD, 3:51 PM 01/29/2015

## 2015-01-29 NOTE — Patient Instructions (Signed)

## 2015-02-03 LAB — CYTOLOGY - PAP

## 2015-02-23 ENCOUNTER — Other Ambulatory Visit: Payer: Self-pay | Admitting: Orthopedic Surgery

## 2015-02-23 DIAGNOSIS — R52 Pain, unspecified: Secondary | ICD-10-CM

## 2015-03-30 ENCOUNTER — Other Ambulatory Visit: Payer: Self-pay | Admitting: Family Medicine

## 2015-03-30 MED ORDER — AMLODIPINE BESYLATE 5 MG PO TABS: 5.0000 mg | ORAL_TABLET | Freq: Every day | ORAL | Status: DC

## 2015-03-30 NOTE — Telephone Encounter (Signed)
Pt is waiting on mail order pharm delivery and needs new rx amlodipine 14 pill only send to [address]

## 2015-03-30 NOTE — Telephone Encounter (Signed)
Filled for 9 months on 11/16/14.  Will send 14 day supply to [facility] on [address].  Pt notified to pick up at the pharmacy.

## 2015-04-21 ENCOUNTER — Encounter: Payer: Self-pay | Admitting: Family Medicine

## 2015-04-21 NOTE — Telephone Encounter (Signed)
Try increasing the Wellbutrin XL to 300 mg daily. Call in a 6 month supply

## 2015-04-22 MED ORDER — BUPROPION HCL ER (XL) 300 MG PO TB24: 300.0000 mg | ORAL_TABLET | Freq: Every day | ORAL | Status: DC

## 2015-04-22 NOTE — Telephone Encounter (Signed)
Script was sent e-scribe to [facility], updated medication list and left a voice message.

## 2015-04-26 ENCOUNTER — Ambulatory Visit: Payer: 59 | Admitting: Licensed Clinical Social Worker

## 2015-04-26 ENCOUNTER — Ambulatory Visit (INDEPENDENT_AMBULATORY_CARE_PROVIDER_SITE_OTHER): Payer: 59 | Admitting: Licensed Clinical Social Worker

## 2015-04-26 DIAGNOSIS — F4323 Adjustment disorder with mixed anxiety and depressed mood: Secondary | ICD-10-CM

## 2015-05-25 ENCOUNTER — Ambulatory Visit (INDEPENDENT_AMBULATORY_CARE_PROVIDER_SITE_OTHER): Payer: 59 | Admitting: Licensed Clinical Social Worker

## 2015-05-25 DIAGNOSIS — F4323 Adjustment disorder with mixed anxiety and depressed mood: Secondary | ICD-10-CM | POA: Diagnosis not present

## 2015-06-01 ENCOUNTER — Other Ambulatory Visit: Payer: Self-pay | Admitting: Family Medicine

## 2015-06-01 ENCOUNTER — Telehealth: Payer: Self-pay

## 2015-06-01 MED ORDER — BUPROPION HCL ER (XL) 300 MG PO TB24: 300.0000 mg | ORAL_TABLET | Freq: Every day | ORAL | Status: DC

## 2015-06-01 NOTE — Telephone Encounter (Signed)
Pt requesting ALPRAZolam (XANAX) 0.25 MG tablet Pt last visit 07/28/14 Pt last refill 06/12/14 #90 with 2 refills

## 2015-06-01 NOTE — Telephone Encounter (Signed)
Call in #90 with 5 rf 

## 2015-06-02 MED ORDER — ALPRAZOLAM 0.25 MG PO TABS: 0.2500 mg | ORAL_TABLET | Freq: Three times a day (TID) | ORAL | Status: DC | PRN

## 2015-06-02 NOTE — Telephone Encounter (Signed)
Rx was fax in

## 2015-06-10 ENCOUNTER — Telehealth: Payer: Self-pay | Admitting: *Deleted

## 2015-06-10 NOTE — Telephone Encounter (Signed)
[facility]  Fax [phone] Refill request buPROPion (WELLBUTRIN XL) 300 MG 24 hr tablet

## 2015-06-11 MED ORDER — BUPROPION HCL ER (XL) 300 MG PO TB24: 300.0000 mg | ORAL_TABLET | Freq: Every day | ORAL | Status: DC

## 2015-06-11 NOTE — Telephone Encounter (Signed)
Per Dr. Sarajane Jews okay to resend and to [facility]. I did send script e-scribe.

## 2015-06-11 NOTE — Telephone Encounter (Signed)
Pt last Rx refill was on 06/01/15 #90 with 1 refills

## 2015-08-16 ENCOUNTER — Other Ambulatory Visit: Payer: Self-pay | Admitting: Family Medicine

## 2015-09-14 ENCOUNTER — Other Ambulatory Visit: Payer: Self-pay | Admitting: Family Medicine

## 2015-09-17 ENCOUNTER — Other Ambulatory Visit: Payer: Self-pay | Admitting: Family Medicine

## 2015-09-17 MED ORDER — NEBIVOLOL HCL 10 MG PO TABS: 10.0000 mg | ORAL_TABLET | Freq: Every day | ORAL | Status: DC

## 2015-09-17 NOTE — Telephone Encounter (Signed)
Pt states [facility] just requested her rx on 6/6 for' BYSTOLIC 10 MG tablet  Pt has one tab left and needs 7 day refill sent to  [facility] [address]  Pt has cpe on 7/26

## 2015-09-17 NOTE — Telephone Encounter (Signed)
Rx done. 

## 2015-09-27 ENCOUNTER — Other Ambulatory Visit: Payer: Self-pay

## 2015-10-04 ENCOUNTER — Encounter: Payer: Self-pay | Admitting: Family Medicine

## 2015-10-21 ENCOUNTER — Encounter: Payer: Self-pay | Admitting: Family Medicine

## 2015-10-27 ENCOUNTER — Other Ambulatory Visit (INDEPENDENT_AMBULATORY_CARE_PROVIDER_SITE_OTHER): Payer: 59

## 2015-10-27 DIAGNOSIS — Z0000 Encounter for general adult medical examination without abnormal findings: Secondary | ICD-10-CM

## 2015-10-27 LAB — BASIC METABOLIC PANEL
BUN: 16 mg/dL (ref 6–23)
CHLORIDE: 101 meq/L (ref 96–112)
CO2: 29 mEq/L (ref 19–32)
Calcium: 9.6 mg/dL (ref 8.4–10.5)
Creatinine, Ser: 1.07 mg/dL (ref 0.40–1.20)
GFR: 55.72 mL/min — ABNORMAL LOW (ref >60.00)
Glucose, Bld: 117 mg/dL — ABNORMAL HIGH (ref 70–99)
POTASSIUM: 3.8 meq/L (ref 3.5–5.1)
SODIUM: 139 meq/L (ref 135–145)

## 2015-10-27 LAB — POC URINALSYSI DIPSTICK (AUTOMATED)
BILIRUBIN UA: NEGATIVE
GLUCOSE UA: NEGATIVE
KETONES UA: NEGATIVE
NITRITE UA: NEGATIVE
Protein, UA: NEGATIVE
SPEC GRAV UA: 1.015
UROBILINOGEN UA: 0.2
pH, UA: 6.5

## 2015-10-27 LAB — HEPATIC FUNCTION PANEL
ALBUMIN: 4.4 g/dL (ref 3.5–5.2)
ALT: 16 U/L (ref 0–35)
AST: 14 U/L (ref 0–37)
Alkaline Phosphatase: 67 U/L (ref 39–117)
Bilirubin, Direct: 0.1 mg/dL (ref 0.0–0.3)
TOTAL PROTEIN: 7.6 g/dL (ref 6.0–8.3)
Total Bilirubin: 0.5 mg/dL (ref 0.2–1.2)

## 2015-10-27 LAB — LIPID PANEL
CHOL/HDL RATIO: 4
Cholesterol: 208 mg/dL — ABNORMAL HIGH (ref 0–200)
HDL: 49.8 mg/dL (ref >39.00)
LDL Cholesterol: 128 mg/dL — ABNORMAL HIGH (ref 0–99)
NONHDL: 158.24
TRIGLYCERIDES: 151 mg/dL — AB (ref 0.0–149.0)
VLDL: 30.2 mg/dL (ref 0.0–40.0)

## 2015-10-27 LAB — CBC WITH DIFFERENTIAL/PLATELET
BASOS PCT: 0.3 % (ref 0.0–3.0)
Basophils Absolute: 0 10*3/uL (ref 0.0–0.1)
EOS PCT: 1.4 % (ref 0.0–5.0)
Eosinophils Absolute: 0.1 10*3/uL (ref 0.0–0.7)
HEMATOCRIT: 41.4 % (ref 36.0–46.0)
HEMOGLOBIN: 13.8 g/dL (ref 12.0–15.0)
Lymphocytes Relative: 24 % (ref 12.0–46.0)
Lymphs Abs: 1.3 10*3/uL (ref 0.7–4.0)
MCHC: 33.3 g/dL (ref 30.0–36.0)
MCV: 89.3 fl (ref 78.0–100.0)
MONO ABS: 0.3 10*3/uL (ref 0.1–1.0)
MONOS PCT: 6 % (ref 3.0–12.0)
Neutro Abs: 3.8 10*3/uL (ref 1.4–7.7)
Neutrophils Relative %: 68.3 % (ref 43.0–77.0)
Platelets: 272 10*3/uL (ref 150.0–400.0)
RBC: 4.64 Mil/uL (ref 3.87–5.11)
RDW: 13.5 % (ref 11.5–15.5)
WBC: 5.5 10*3/uL (ref 4.0–10.5)

## 2015-10-27 LAB — TSH: TSH: 2.98 u[IU]/mL (ref 0.35–4.50)

## 2015-11-03 ENCOUNTER — Ambulatory Visit (INDEPENDENT_AMBULATORY_CARE_PROVIDER_SITE_OTHER): Payer: 59 | Admitting: Family Medicine

## 2015-11-03 ENCOUNTER — Encounter: Payer: Self-pay | Admitting: Family Medicine

## 2015-11-03 VITALS — BP 138/89 | HR 73 | Temp 98.1°F | Ht 65.0 in | Wt 193.0 lb

## 2015-11-03 DIAGNOSIS — Z0000 Encounter for general adult medical examination without abnormal findings: Secondary | ICD-10-CM | POA: Diagnosis not present

## 2015-11-03 MED ORDER — NEBIVOLOL HCL 10 MG PO TABS
10.0000 mg | ORAL_TABLET | Freq: Every day | ORAL | 3 refills | Status: DC
Start: 2015-11-03 — End: 2015-11-22

## 2015-11-03 MED ORDER — HYDROCHLOROTHIAZIDE 25 MG PO TABS: 25.0000 mg | ORAL_TABLET | Freq: Every day | ORAL | 3 refills | Status: DC

## 2015-11-03 MED ORDER — PRAMOXINE HCL 1 % EX CREA: 1.0000 "application " | TOPICAL_CREAM | Freq: Two times a day (BID) | CUTANEOUS | 5 refills | Status: DC | PRN

## 2015-11-03 MED ORDER — BUPROPION HCL ER (XL) 300 MG PO TB24
300.0000 mg | ORAL_TABLET | Freq: Every day | ORAL | 3 refills | Status: DC
Start: 2015-11-03 — End: 2015-11-22

## 2015-11-03 MED ORDER — POTASSIUM CHLORIDE ER 10 MEQ PO TBCR
10.0000 meq | EXTENDED_RELEASE_TABLET | Freq: Every day | ORAL | 3 refills | Status: DC
Start: 2015-11-03 — End: 2015-11-22

## 2015-11-03 MED ORDER — AMLODIPINE BESYLATE 5 MG PO TABS: ORAL_TABLET | ORAL | 3 refills | Status: DC

## 2015-11-03 NOTE — Progress Notes (Signed)
   Subjective:    Patient ID: Sherry Chapman, female    DOB: 23-Dec-1955, 60 y.o.   MRN: [id number]  HPI 60 yr old female for a well exam. She feels good.    Review of Systems  Constitutional: Negative.   HENT: Negative.   Eyes: Negative.   Respiratory: Negative.   Cardiovascular: Negative.   Gastrointestinal: Negative.   Genitourinary: Negative for decreased urine volume, difficulty urinating, dyspareunia, dysuria, enuresis, flank pain, frequency, hematuria, pelvic pain and urgency.  Musculoskeletal: Negative.   Skin: Negative.   Neurological: Negative.   Psychiatric/Behavioral: Negative.        Objective:   Physical Exam  Constitutional: She is oriented to person, place, and time. She appears well-developed and well-nourished. No distress.  HENT:  Head: Normocephalic and atraumatic.  Right Ear: External ear normal.  Left Ear: External ear normal.  Nose: Nose normal.  Mouth/Throat: Oropharynx is clear and moist. No oropharyngeal exudate.  Eyes: Conjunctivae and EOM are normal. Pupils are equal, round, and reactive to light. No scleral icterus.  Neck: Normal range of motion. Neck supple. No JVD present. No thyromegaly present.  Cardiovascular: Normal rate, regular rhythm, normal heart sounds and intact distal pulses.  Exam reveals no gallop and no friction rub.   No murmur heard. Normal except LAFB   Pulmonary/Chest: Effort normal and breath sounds normal. No respiratory distress. She has no wheezes. She has no rales. She exhibits no tenderness.  Abdominal: Soft. Bowel sounds are normal. She exhibits no distension and no mass. There is no tenderness. There is no rebound and no guarding.  Musculoskeletal: Normal range of motion. She exhibits no edema or tenderness.  Lymphadenopathy:    She has no cervical adenopathy.  Neurological: She is alert and oriented to person, place, and time. She has normal reflexes. No cranial nerve deficit. She exhibits normal muscle tone. Coordination  normal.  Skin: Skin is warm and dry. No rash noted. No erythema.  Psychiatric: She has a normal mood and affect. Her behavior is normal. Judgment and thought content normal.          Assessment & Plan:  Well exam. We discussed diet and exercise.  Laurey Morale, MD

## 2015-11-19 ENCOUNTER — Other Ambulatory Visit: Payer: Self-pay | Admitting: Family Medicine

## 2015-11-22 ENCOUNTER — Other Ambulatory Visit: Payer: Self-pay | Admitting: Family Medicine

## 2015-11-22 MED ORDER — NEBIVOLOL HCL 10 MG PO TABS: 10.0000 mg | ORAL_TABLET | Freq: Every day | ORAL | 3 refills | Status: DC

## 2015-11-22 MED ORDER — BUPROPION HCL ER (XL) 300 MG PO TB24: 300.0000 mg | ORAL_TABLET | Freq: Every day | ORAL | 3 refills | Status: DC

## 2015-11-22 MED ORDER — AMLODIPINE BESYLATE 5 MG PO TABS: ORAL_TABLET | ORAL | 3 refills | Status: DC

## 2015-11-22 MED ORDER — POTASSIUM CHLORIDE ER 10 MEQ PO TBCR: 10.0000 meq | EXTENDED_RELEASE_TABLET | Freq: Every day | ORAL | 3 refills | Status: DC

## 2015-11-22 MED ORDER — HYDROCHLOROTHIAZIDE 25 MG PO TABS: 25.0000 mg | ORAL_TABLET | Freq: Every day | ORAL | 3 refills | Status: DC

## 2015-11-22 NOTE — Telephone Encounter (Signed)
Pt states she does not use [facility] for her maintenance meds. Can you resend  amLODipine (NORVASC) 5 MG tablet buPROPion (WELLBUTRIN XL) 300 MG 24 hr tablet hydrochlorothiazide (HYDRODIURIL) 25 MG tablet nebivolol (BYSTOLIC) 10 MG tablet potassium chloride (K-DUR) 10 MEQ tablet  [facility]  Pt states she misunderstood when asked about her pharmacy info.  Pt is almost out of norvasc, and will let us know if she needs locally. But needs asap.

## 2015-11-22 NOTE — Telephone Encounter (Signed)
Rx's sent to [facility].  

## 2016-02-08 ENCOUNTER — Ambulatory Visit (INDEPENDENT_AMBULATORY_CARE_PROVIDER_SITE_OTHER): Payer: 59 | Admitting: Licensed Clinical Social Worker

## 2016-02-08 DIAGNOSIS — F324 Major depressive disorder, single episode, in partial remission: Secondary | ICD-10-CM

## 2016-03-16 ENCOUNTER — Telehealth: Payer: Self-pay | Admitting: Family Medicine

## 2016-03-16 NOTE — Telephone Encounter (Signed)
Patient Name: Sherry Chapman  DOB: 20-Oct-1955    Initial Comment Caller is [place] and having pain from back to middle abdomen.   Nurse Assessment  Nurse: Adella Nissen, RN, Leitha Schuller Date/Time ([place] Time): 03/16/2016 3:41:26 PM  Confirm and document reason for call. If symptomatic, describe symptoms. ---Caller states pain under rib cage and radiates down the back, into the abdomen and just generally feels icky, no temperature, no urinary symptoms, no diarrhea, pain comes in waves  Does the patient have any new or worsening symptoms? ---Yes  Will a triage be completed? ---Yes  Related visit to physician within the last 2 weeks? ---No  Does the PT have any chronic conditions? (i.e. diabetes, asthma, etc.) ---Yes  List chronic conditions. ---hypertension  Is this a behavioral health or substance abuse call? ---No     Guidelines    Guideline Title Affirmed Question Affirmed Notes  Abdominal Pain - Female [1] MILD-MODERATE abdominal pain AND [2] comes and goes (cramps) (all triage questions negative)    Final Disposition User   [place], RN, Leitha Schuller    Disagree/Comply: Comply

## 2016-03-16 NOTE — Telephone Encounter (Signed)
Spoke with pt and she states that she is feeling better. The pain is subsiding somewhat. Advised pt to try and keep a journal of foods she has eaten prior to these episodes to see if any correlation. She is on [employer] and has been eating very healthy. Advised that some fresh fruits and vegetables can cause increased gas and stomach upset. She will keep a log and let us know. Also advised if develops f/n/v to see UC or ED for evaluation. Pt voiced understanding. Nothing further needed at this time.

## 2016-03-22 ENCOUNTER — Ambulatory Visit (INDEPENDENT_AMBULATORY_CARE_PROVIDER_SITE_OTHER): Payer: 59 | Admitting: Family Medicine

## 2016-03-22 VITALS — BP 148/88 | HR 73 | Temp 97.6°F | Ht 65.0 in | Wt 187.0 lb

## 2016-03-22 DIAGNOSIS — R1012 Left upper quadrant pain: Secondary | ICD-10-CM

## 2016-03-22 DIAGNOSIS — R1011 Right upper quadrant pain: Secondary | ICD-10-CM | POA: Diagnosis not present

## 2016-03-22 NOTE — Progress Notes (Signed)
Subjective:     Patient ID: Sherry Chapman, female   DOB: 04-25-1955, 60 y.o.   MRN: [id number]  HPI Patient seen with 8 to 9 day history of some upper abdominal pain. She just started [employer] recently and has recently lost some weight due to her efforts. Her pain is intermittent. Seems to be possibly worse late in the day. No specific correlation with eating. Symptoms usually last 1-2 hours. She describes a dull achy pain of moderate severity. She's had some mild nausea but no vomiting. No change of bowel habits. No melena. No dysuria. No other clear exacerbating factors. She has had some very slight constipation recently. No nonsteroidal use. No alcohol use. She relates that she has somewhat chronic mild intermittent diarrhea but no prior history of IBS. These symptoms seem to be different. Occasional radiation of pain toward the back.  She has not noted any fever or chills  Past Medical History:  Diagnosis Date  . Allergy   . Anxiety   . Back pain   . Benign neoplasm of adrenal gland   . Depression   . GERD (gastroesophageal reflux disease)   . Hematuria, unspecified   . Hyperlipidemia   . Hypertension   . Restless leg syndrome   . Vertigo    Past Surgical History:  Procedure Laterality Date  . COLONOSCOPY  05-18-07   per Dr. Deatra Ina, repeat in 10 yrs   . excision of dorsal ganglion from right wrist 06-05-2008 Dr Daylene Katayama    . NASAL SINUS SURGERY      reports that she has never smoked. She has never used smokeless tobacco. She reports that she does not drink alcohol or use drugs. family history includes Alcohol abuse in her father; Aneurysm in her brother; Coronary artery disease in her father; Diabetes in her brother, mother, and sister; Heart disease in her brother and mother; Hypertension in her brother, father, mother, and sister. Allergies  Allergen Reactions  . Prednisone Anxiety    Insomnia, Hypersensitivity  . Clarithromycin     Biaxin - REACTION: Nausea  .  Clindamycin/Lincomycin Diarrhea     Review of Systems  Constitutional: Negative for chills and fever.  Respiratory: Negative for shortness of breath.   Cardiovascular: Negative for chest pain.  Gastrointestinal: Positive for abdominal pain. Negative for abdominal distention, blood in stool and vomiting.  Genitourinary: Negative for dysuria.       Objective:   Physical Exam  Constitutional: She appears well-developed and well-nourished.  Cardiovascular: Normal rate and regular rhythm.   Pulmonary/Chest: Effort normal and breath sounds normal. No respiratory distress. She has no wheezes. She has no rales.  Abdominal: Soft. Bowel sounds are normal. She exhibits no distension and no mass. There is tenderness. There is no rebound and no guarding.  Patient has some mild tenderness to palpation right upper quadrant. No guarding       Assessment:     Abdominal pain. Patient described somewhat diffuse upper abdominal pain but by exam she has some tenderness mostly right upper quadrant. Rule out symptomatic cholelithiasis.  Nontoxic and in no distress at this time.    Plan:     -She'll return tomorrow for labs with CBC, basic metabolic panel, hepatic panel, lipase -Set up upper abdominal ultrasound to evaluate-hopefully can get in next couple of days. -Follow-up immediately for any fever or progressive pain  Eulas Post MD [facility]

## 2016-03-22 NOTE — Patient Instructions (Signed)

## 2016-03-22 NOTE — Progress Notes (Signed)
Pre visit review using our clinic review tool, if applicable. No additional management support is needed unless otherwise documented below in the visit note. 

## 2016-03-23 LAB — BASIC METABOLIC PANEL
BUN: 20 mg/dL (ref 6–23)
CALCIUM: 9.9 mg/dL (ref 8.4–10.5)
CO2: 32 mEq/L (ref 19–32)
Chloride: 100 mEq/L (ref 96–112)
Creatinine, Ser: 1.26 mg/dL — ABNORMAL HIGH (ref 0.40–1.20)
GFR: 46.08 mL/min — AB (ref >60.00)
GLUCOSE: 99 mg/dL (ref 70–99)
POTASSIUM: 4.1 meq/L (ref 3.5–5.1)
SODIUM: 139 meq/L (ref 135–145)

## 2016-03-23 LAB — HEPATIC FUNCTION PANEL
ALT: 15 U/L (ref 0–35)
AST: 14 U/L (ref 0–37)
Albumin: 4.8 g/dL (ref 3.5–5.2)
Alkaline Phosphatase: 65 U/L (ref 39–117)
Bilirubin, Direct: 0.1 mg/dL (ref 0.0–0.3)
TOTAL PROTEIN: 7.8 g/dL (ref 6.0–8.3)
Total Bilirubin: 0.3 mg/dL (ref 0.2–1.2)

## 2016-03-23 LAB — LIPASE: LIPASE: 31 U/L (ref 11.0–59.0)

## 2016-03-24 ENCOUNTER — Encounter: Payer: Self-pay | Admitting: Family Medicine

## 2016-03-24 LAB — CBC WITH DIFFERENTIAL/PLATELET
BASOS PCT: 0.3 % (ref 0.0–3.0)
Basophils Absolute: 0 10*3/uL (ref 0.0–0.1)
EOS PCT: 1.4 % (ref 0.0–5.0)
Eosinophils Absolute: 0.1 10*3/uL (ref 0.0–0.7)
HEMATOCRIT: 41.2 % (ref 36.0–46.0)
HEMOGLOBIN: 13.9 g/dL (ref 12.0–15.0)
LYMPHS PCT: 28.2 % (ref 12.0–46.0)
Lymphs Abs: 1.7 10*3/uL (ref 0.7–4.0)
MCHC: 33.8 g/dL (ref 30.0–36.0)
MCV: 88.2 fl (ref 78.0–100.0)
MONOS PCT: 4.5 % (ref 3.0–12.0)
Monocytes Absolute: 0.3 10*3/uL (ref 0.1–1.0)
Neutro Abs: 4 10*3/uL (ref 1.4–7.7)
Neutrophils Relative %: 65.6 % (ref 43.0–77.0)
Platelets: 276 10*3/uL (ref 150.0–400.0)
RBC: 4.66 Mil/uL (ref 3.87–5.11)
RDW: 13.2 % (ref 11.5–15.5)
WBC: 6.1 10*3/uL (ref 4.0–10.5)

## 2016-03-29 ENCOUNTER — Other Ambulatory Visit: Payer: Self-pay

## 2016-04-28 DIAGNOSIS — J029 Acute pharyngitis, unspecified: Secondary | ICD-10-CM | POA: Diagnosis not present

## 2016-05-02 DIAGNOSIS — J320 Chronic maxillary sinusitis: Secondary | ICD-10-CM | POA: Diagnosis not present

## 2016-05-02 DIAGNOSIS — H6521 Chronic serous otitis media, right ear: Secondary | ICD-10-CM | POA: Diagnosis not present

## 2016-05-02 DIAGNOSIS — J322 Chronic ethmoidal sinusitis: Secondary | ICD-10-CM | POA: Diagnosis not present

## 2016-05-09 DIAGNOSIS — J320 Chronic maxillary sinusitis: Secondary | ICD-10-CM | POA: Diagnosis not present

## 2016-05-09 DIAGNOSIS — J322 Chronic ethmoidal sinusitis: Secondary | ICD-10-CM | POA: Diagnosis not present

## 2016-05-09 DIAGNOSIS — H6523 Chronic serous otitis media, bilateral: Secondary | ICD-10-CM | POA: Diagnosis not present

## 2016-05-12 DIAGNOSIS — H6991 Unspecified Eustachian tube disorder, right ear: Secondary | ICD-10-CM | POA: Diagnosis not present

## 2016-05-12 DIAGNOSIS — R070 Pain in throat: Secondary | ICD-10-CM | POA: Diagnosis not present

## 2016-06-22 ENCOUNTER — Other Ambulatory Visit: Payer: Self-pay | Admitting: Family Medicine

## 2016-06-22 NOTE — Telephone Encounter (Signed)
Refill for Amlodipine 5 mg and [facility]

## 2016-06-26 MED ORDER — AMLODIPINE BESYLATE 5 MG PO TABS: ORAL_TABLET | ORAL | 1 refills | Status: DC

## 2016-06-26 NOTE — Telephone Encounter (Signed)
I sent script e-scribe to mail order.  

## 2016-06-27 ENCOUNTER — Encounter: Payer: 59 | Admitting: Gynecology

## 2016-07-21 ENCOUNTER — Ambulatory Visit: Payer: 59 | Admitting: Podiatry

## 2016-08-29 DIAGNOSIS — B353 Tinea pedis: Secondary | ICD-10-CM | POA: Diagnosis not present

## 2016-08-29 DIAGNOSIS — B351 Tinea unguium: Secondary | ICD-10-CM | POA: Diagnosis not present

## 2016-09-12 ENCOUNTER — Encounter: Payer: Self-pay | Admitting: Gynecology

## 2016-09-12 ENCOUNTER — Ambulatory Visit (INDEPENDENT_AMBULATORY_CARE_PROVIDER_SITE_OTHER): Payer: 59 | Admitting: Gynecology

## 2016-09-12 VITALS — BP 130/80 | Ht 65.0 in | Wt 199.0 lb

## 2016-09-12 DIAGNOSIS — Z01411 Encounter for gynecological examination (general) (routine) with abnormal findings: Secondary | ICD-10-CM

## 2016-09-12 DIAGNOSIS — N952 Postmenopausal atrophic vaginitis: Secondary | ICD-10-CM | POA: Diagnosis not present

## 2016-09-12 NOTE — Patient Instructions (Signed)
Schedule your colonoscopy

## 2016-09-12 NOTE — Progress Notes (Signed)
    Sherry Chapman [birth year] [id number]        61 y.o.  [id number] for annual exam.    Past medical history,surgical history, problem list, medications, allergies, family history and social history were all reviewed and documented as reviewed in the EPIC chart.  ROS:  Performed with pertinent positives and negatives included in the history, assessment and plan.   Additional significant findings :  None   Exam: Sherry Chapman assistant Vitals:   09/12/16 1502  BP: 130/80  Weight: 199 lb (90.3 kg)  Height: 5\' 5"  (1.651 m)   Body mass index is 33.12 kg/m.  General appearance:  Normal affect, orientation and appearance. Skin: Grossly normal HEENT: Without gross lesions.  No cervical or supraclavicular adenopathy. Thyroid normal.  Lungs:  Clear without wheezing, rales or rhonchi Cardiac: RR, without RMG Abdominal:  Soft, nontender, without masses, guarding, rebound, organomegaly or hernia Breasts:  Examined lying and sitting without masses, retractions, discharge or axillary adenopathy. Pelvic:  Ext, BUS, Vagina: With atrophic changes  Cervix: With atrophic changes  Uterus: Anteverted, normal size, shape and contour, midline and mobile nontender   Adnexa: Without masses or tenderness    Anus and perineum: Normal   Rectovaginal: Normal sphincter tone without palpated masses or tenderness.    Assessment/Plan:  61 y.o. [id number] female for annual exam.   1. Postmenopausal/atrophic genital changes. No significant hot flushes, night sweats, vaginal dryness or any vaginal bleeding. Continue to monitor and report any issues or bleeding. 2. Mammography coming due end of June/July. I reminded patient to schedule this. SBE monthly reviewed. 3. Patient in the process of setting up colonoscopy. 4. Pap smear 01/2015. No Pap smear done today. No history of significant abnormal Pap smears. Plan repeat Pap smear next year at 3 year interval per current screening guidelines. 5. DEXA never. Will plan  baseline DEXA and another year or 2. Increased calcium vitamin D. 6. Health maintenance. No routine lab work done as patient reports this done elsewhere. Follow up in one year, sooner as needed.   Anastasio Auerbach MD, 3:56 PM 09/12/2016

## 2016-10-27 ENCOUNTER — Other Ambulatory Visit: Payer: Self-pay | Admitting: Family Medicine

## 2016-10-27 ENCOUNTER — Other Ambulatory Visit (INDEPENDENT_AMBULATORY_CARE_PROVIDER_SITE_OTHER): Payer: 59

## 2016-10-27 DIAGNOSIS — R739 Hyperglycemia, unspecified: Secondary | ICD-10-CM | POA: Diagnosis not present

## 2016-10-27 DIAGNOSIS — I10 Essential (primary) hypertension: Secondary | ICD-10-CM

## 2016-10-27 LAB — LIPID PANEL
CHOL/HDL RATIO: 4
Cholesterol: 162 mg/dL (ref 0–200)
HDL: 46.2 mg/dL (ref >39.00)
LDL Cholesterol: 94 mg/dL (ref 0–99)
NONHDL: 115.58
TRIGLYCERIDES: 107 mg/dL (ref 0.0–149.0)
VLDL: 21.4 mg/dL (ref 0.0–40.0)

## 2016-10-27 LAB — BASIC METABOLIC PANEL
BUN: 18 mg/dL (ref 6–23)
CHLORIDE: 103 meq/L (ref 96–112)
CO2: 31 meq/L (ref 19–32)
CREATININE: 1.05 mg/dL (ref 0.40–1.20)
Calcium: 9.4 mg/dL (ref 8.4–10.5)
GFR: 56.76 mL/min — ABNORMAL LOW (ref >60.00)
Glucose, Bld: 125 mg/dL — ABNORMAL HIGH (ref 70–99)
POTASSIUM: 3.8 meq/L (ref 3.5–5.1)
Sodium: 140 mEq/L (ref 135–145)

## 2016-10-27 LAB — HEPATIC FUNCTION PANEL
ALT: 17 U/L (ref 0–35)
AST: 13 U/L (ref 0–37)
Albumin: 4.2 g/dL (ref 3.5–5.2)
Alkaline Phosphatase: 62 U/L (ref 39–117)
BILIRUBIN DIRECT: 0.1 mg/dL (ref 0.0–0.3)
TOTAL PROTEIN: 6.7 g/dL (ref 6.0–8.3)
Total Bilirubin: 0.4 mg/dL (ref 0.2–1.2)

## 2016-10-27 LAB — TSH: TSH: 2.93 u[IU]/mL (ref 0.35–4.50)

## 2016-10-27 LAB — POC URINALSYSI DIPSTICK (AUTOMATED)
Bilirubin, UA: NEGATIVE
GLUCOSE UA: NEGATIVE
Ketones, UA: NEGATIVE
Leukocytes, UA: NEGATIVE
NITRITE UA: NEGATIVE
PH UA: 6 (ref 5.0–8.0)
PROTEIN UA: NEGATIVE
RBC UA: NEGATIVE
UROBILINOGEN UA: 0.2 U/dL

## 2016-10-27 LAB — CBC WITH DIFFERENTIAL/PLATELET
Basophils Absolute: 0 10*3/uL (ref 0.0–0.1)
Basophils Relative: 0.6 % (ref 0.0–3.0)
Eosinophils Absolute: 0.1 10*3/uL (ref 0.0–0.7)
Eosinophils Relative: 2.1 % (ref 0.0–5.0)
HEMATOCRIT: 40.6 % (ref 36.0–46.0)
Hemoglobin: 13.4 g/dL (ref 12.0–15.0)
LYMPHS PCT: 39 % (ref 12.0–46.0)
Lymphs Abs: 1.5 10*3/uL (ref 0.7–4.0)
MCHC: 33.1 g/dL (ref 30.0–36.0)
MCV: 89.5 fl (ref 78.0–100.0)
MONOS PCT: 7.7 % (ref 3.0–12.0)
Monocytes Absolute: 0.3 10*3/uL (ref 0.1–1.0)
NEUTROS PCT: 50.6 % (ref 43.0–77.0)
Neutro Abs: 1.9 10*3/uL (ref 1.4–7.7)
Platelets: 263 10*3/uL (ref 150.0–400.0)
RBC: 4.54 Mil/uL (ref 3.87–5.11)
RDW: 13.2 % (ref 11.5–15.5)
WBC: 3.8 10*3/uL — ABNORMAL LOW (ref 4.0–10.5)

## 2016-10-27 LAB — HEMOGLOBIN A1C: HEMOGLOBIN A1C: 5.9 % (ref 4.6–6.5)

## 2016-11-03 ENCOUNTER — Encounter: Payer: Self-pay | Admitting: Family Medicine

## 2016-11-03 ENCOUNTER — Ambulatory Visit (INDEPENDENT_AMBULATORY_CARE_PROVIDER_SITE_OTHER): Payer: 59 | Admitting: Family Medicine

## 2016-11-03 VITALS — BP 130/88 | HR 72 | Temp 98.0°F | Ht 65.0 in | Wt 199.0 lb

## 2016-11-03 DIAGNOSIS — Z0000 Encounter for general adult medical examination without abnormal findings: Secondary | ICD-10-CM | POA: Diagnosis not present

## 2016-11-03 MED ORDER — ALPRAZOLAM 0.25 MG PO TABS: 0.2500 mg | ORAL_TABLET | Freq: Three times a day (TID) | ORAL | 5 refills | Status: DC | PRN

## 2016-11-03 MED ORDER — HYDROCHLOROTHIAZIDE 25 MG PO TABS: 25.0000 mg | ORAL_TABLET | Freq: Every day | ORAL | 3 refills | Status: DC

## 2016-11-03 MED ORDER — POTASSIUM CHLORIDE ER 10 MEQ PO TBCR: 10.0000 meq | EXTENDED_RELEASE_TABLET | Freq: Every day | ORAL | 3 refills | Status: DC

## 2016-11-03 MED ORDER — AMLODIPINE BESYLATE 5 MG PO TABS: ORAL_TABLET | ORAL | 3 refills | Status: DC

## 2016-11-03 MED ORDER — NEBIVOLOL HCL 10 MG PO TABS: 10.0000 mg | ORAL_TABLET | Freq: Every day | ORAL | 3 refills | Status: DC

## 2016-11-03 MED ORDER — BUPROPION HCL ER (XL) 300 MG PO TB24: 300.0000 mg | ORAL_TABLET | Freq: Every day | ORAL | 3 refills | Status: DC

## 2016-11-03 NOTE — Patient Instructions (Signed)
WE NOW OFFER   [facility] FAST TRACK!!!  SAME DAY Appointments for ACUTE CARE  Such as: Sprains, Injuries, cuts, abrasions, rashes, muscle pain, joint pain, back pain Colds, flu, sore throats, headache, allergies, cough, fever  Ear pain, sinus and eye infections Abdominal pain, nausea, vomiting, diarrhea, upset stomach Animal/insect bites  3 Easy Ways to Schedule: Walk-In Scheduling Call in scheduling Mychart Sign-up: [email or web address]         

## 2016-11-03 NOTE — Progress Notes (Signed)
   Subjective:    Patient ID: Sherry Chapman, female    DOB: 27-Apr-1955, 61 y.o.   MRN: [id number]  HPI Here for a well exam. She feels good.    Review of Systems  Constitutional: Negative.   HENT: Negative.   Eyes: Negative.   Respiratory: Negative.   Cardiovascular: Negative.   Gastrointestinal: Negative.   Genitourinary: Negative for decreased urine volume, difficulty urinating, dyspareunia, dysuria, enuresis, flank pain, frequency, hematuria, pelvic pain and urgency.  Musculoskeletal: Negative.   Skin: Negative.   Neurological: Negative.   Psychiatric/Behavioral: Negative.        Objective:   Physical Exam  Constitutional: She is oriented to person, place, and time. She appears well-developed and well-nourished. No distress.  HENT:  Head: Normocephalic and atraumatic.  Right Ear: External ear normal.  Left Ear: External ear normal.  Nose: Nose normal.  Mouth/Throat: Oropharynx is clear and moist. No oropharyngeal exudate.  Eyes: Pupils are equal, round, and reactive to light. Conjunctivae and EOM are normal. No scleral icterus.  Neck: Normal range of motion. Neck supple. No JVD present. No thyromegaly present.  Cardiovascular: Normal rate, regular rhythm, normal heart sounds and intact distal pulses.  Exam reveals no gallop and no friction rub.   No murmur heard. Pulmonary/Chest: Effort normal and breath sounds normal. No respiratory distress. She has no wheezes. She has no rales. She exhibits no tenderness.  Abdominal: Soft. Bowel sounds are normal. She exhibits no distension and no mass. There is no tenderness. There is no rebound and no guarding.  Musculoskeletal: Normal range of motion. She exhibits no edema or tenderness.  Lymphadenopathy:    She has no cervical adenopathy.  Neurological: She is alert and oriented to person, place, and time. She has normal reflexes. No cranial nerve deficit. She exhibits normal muscle tone. Coordination normal.  Skin: Skin is warm and  dry. No rash noted. No erythema.  Psychiatric: She has a normal mood and affect. Her behavior is normal. Judgment and thought content normal.          Assessment & Plan:  Well exam. We discussed diet and exercise. We reviewed her labs.  Alysia Penna, MD

## 2016-11-15 ENCOUNTER — Ambulatory Visit: Payer: 59 | Admitting: Licensed Clinical Social Worker

## 2016-12-07 ENCOUNTER — Ambulatory Visit: Payer: Self-pay | Admitting: Licensed Clinical Social Worker

## 2016-12-21 ENCOUNTER — Ambulatory Visit: Payer: 59 | Admitting: Licensed Clinical Social Worker

## 2016-12-25 ENCOUNTER — Encounter: Payer: Self-pay | Admitting: Family Medicine

## 2016-12-25 MED ORDER — BUPROPION HCL ER (XL) 150 MG PO TB24: 150.0000 mg | ORAL_TABLET | Freq: Every day | ORAL | 3 refills | Status: DC

## 2016-12-25 NOTE — Telephone Encounter (Signed)
rx is ready to fax to [facility]. Please call in a 30 day supply locally

## 2016-12-28 ENCOUNTER — Encounter: Payer: Self-pay | Admitting: Family Medicine

## 2017-01-02 ENCOUNTER — Other Ambulatory Visit: Payer: Self-pay | Admitting: Family Medicine

## 2017-01-02 ENCOUNTER — Encounter: Payer: Self-pay | Admitting: Family Medicine

## 2017-01-02 MED ORDER — CYCLOBENZAPRINE HCL 10 MG PO TABS: 10.0000 mg | ORAL_TABLET | Freq: Three times a day (TID) | ORAL | 2 refills | Status: DC | PRN

## 2017-01-02 NOTE — Telephone Encounter (Signed)
There would be no med interactions. Call in Flexeril 10 mg to take TID prn spasms, #90 with 2 rf

## 2017-01-29 ENCOUNTER — Other Ambulatory Visit: Payer: Self-pay | Admitting: Family Medicine

## 2017-01-30 ENCOUNTER — Ambulatory Visit (INDEPENDENT_AMBULATORY_CARE_PROVIDER_SITE_OTHER): Payer: Self-pay | Admitting: Licensed Clinical Social Worker

## 2017-01-30 DIAGNOSIS — F324 Major depressive disorder, single episode, in partial remission: Secondary | ICD-10-CM

## 2017-02-01 ENCOUNTER — Encounter: Payer: Self-pay | Admitting: Family Medicine

## 2017-02-02 NOTE — Telephone Encounter (Signed)
I agree. Call in Lexapro 10 mg daily, #30 with 3 rf

## 2017-02-05 MED ORDER — ESCITALOPRAM OXALATE 10 MG PO TABS: 10.0000 mg | ORAL_TABLET | Freq: Every day | ORAL | 3 refills | Status: DC

## 2017-02-05 NOTE — Telephone Encounter (Signed)
Rx done and pt informed via Mychart message. 

## 2017-02-20 ENCOUNTER — Ambulatory Visit: Payer: 59 | Admitting: Licensed Clinical Social Worker

## 2017-02-22 ENCOUNTER — Other Ambulatory Visit: Payer: Self-pay | Admitting: Family Medicine

## 2017-03-06 ENCOUNTER — Ambulatory Visit (INDEPENDENT_AMBULATORY_CARE_PROVIDER_SITE_OTHER): Payer: 59 | Admitting: Licensed Clinical Social Worker

## 2017-03-06 DIAGNOSIS — F324 Major depressive disorder, single episode, in partial remission: Secondary | ICD-10-CM | POA: Diagnosis not present

## 2017-03-07 ENCOUNTER — Other Ambulatory Visit: Payer: Self-pay

## 2017-03-07 ENCOUNTER — Encounter: Payer: Self-pay | Admitting: Family Medicine

## 2017-03-07 MED ORDER — AMLODIPINE BESYLATE 5 MG PO TABS: ORAL_TABLET | ORAL | 2 refills | Status: DC

## 2017-03-15 ENCOUNTER — Encounter: Payer: Self-pay | Admitting: Family Medicine

## 2017-03-16 NOTE — Telephone Encounter (Signed)
Yes that sounds like a good treatment. See me if this does not help in 2 weeks

## 2017-03-21 NOTE — Telephone Encounter (Signed)
Yes try that for a week and see if it helps

## 2017-03-28 ENCOUNTER — Ambulatory Visit: Payer: 59 | Admitting: Licensed Clinical Social Worker

## 2017-04-10 DIAGNOSIS — Z860100 Personal history of colon polyps, unspecified: Secondary | ICD-10-CM

## 2017-04-10 HISTORY — DX: Personal history of colonic polyps: Z86.010

## 2017-04-10 HISTORY — DX: Personal history of colon polyps, unspecified: Z86.0100

## 2017-04-24 ENCOUNTER — Ambulatory Visit (INDEPENDENT_AMBULATORY_CARE_PROVIDER_SITE_OTHER): Payer: 59 | Admitting: Licensed Clinical Social Worker

## 2017-04-24 DIAGNOSIS — F324 Major depressive disorder, single episode, in partial remission: Secondary | ICD-10-CM

## 2017-05-28 ENCOUNTER — Ambulatory Visit (INDEPENDENT_AMBULATORY_CARE_PROVIDER_SITE_OTHER): Payer: 59 | Admitting: Licensed Clinical Social Worker

## 2017-05-28 DIAGNOSIS — F324 Major depressive disorder, single episode, in partial remission: Secondary | ICD-10-CM | POA: Diagnosis not present

## 2017-06-21 DIAGNOSIS — H9312 Tinnitus, left ear: Secondary | ICD-10-CM | POA: Diagnosis not present

## 2017-06-21 DIAGNOSIS — H6523 Chronic serous otitis media, bilateral: Secondary | ICD-10-CM | POA: Diagnosis not present

## 2017-06-21 DIAGNOSIS — H903 Sensorineural hearing loss, bilateral: Secondary | ICD-10-CM | POA: Diagnosis not present

## 2017-07-02 ENCOUNTER — Ambulatory Visit: Payer: 59 | Admitting: Licensed Clinical Social Worker

## 2017-07-09 DIAGNOSIS — H1045 Other chronic allergic conjunctivitis: Secondary | ICD-10-CM | POA: Diagnosis not present

## 2017-08-04 DIAGNOSIS — L0291 Cutaneous abscess, unspecified: Secondary | ICD-10-CM | POA: Diagnosis not present

## 2017-08-09 ENCOUNTER — Telehealth: Payer: Self-pay | Admitting: Family Medicine

## 2017-08-09 NOTE — Telephone Encounter (Signed)
Copied from [place] [id number]. Topic: Quick Communication - Rx Refill/Question >> Aug 09, 2017  4:25 PM Selinda Flavin B, NT wrote: Medication: amLODipine (NORVASC) 5 MG tablet (completely out of this med) , hydrochlorothiazide (HYDRODIURIL) 25 MG tablet  &    KLOR-CON 10 10 MEQ tablet  Has the patient contacted their pharmacy? Yes.   (Agent: If no, request that the patient contact the pharmacy for the refill.) Preferred Pharmacy (with phone number or street name):  [facility] [place] [place]: Please be advised that RX refills may take up to 3 business days. We ask that you follow-up with your pharmacy.  These are 90 day supplies.

## 2017-08-10 ENCOUNTER — Other Ambulatory Visit: Payer: Self-pay

## 2017-08-10 ENCOUNTER — Telehealth: Payer: Self-pay

## 2017-08-10 MED ORDER — POTASSIUM CHLORIDE ER 10 MEQ PO TBCR: EXTENDED_RELEASE_TABLET | ORAL | 0 refills | Status: DC

## 2017-08-10 MED ORDER — AMLODIPINE BESYLATE 5 MG PO TABS: ORAL_TABLET | ORAL | 0 refills | Status: DC

## 2017-08-10 MED ORDER — HYDROCHLOROTHIAZIDE 25 MG PO TABS: ORAL_TABLET | ORAL | 0 refills | Status: DC

## 2017-08-10 NOTE — Telephone Encounter (Signed)
Patient called and is completely out of medication and would like to know if Dr. Sarajane Jews can send refill today for her please.

## 2017-08-10 NOTE — Telephone Encounter (Signed)
[facility]   [facility] the following   HCTZ, Kcl 10 MEQ, and amlodipine 5 MG pt would like 90 day supply  Last OV 11/03/2016   Rx's have been sent

## 2017-08-10 NOTE — Telephone Encounter (Signed)
Medications filled to pharmacy as requested by [facility] RN.

## 2017-08-14 ENCOUNTER — Telehealth: Payer: Self-pay | Admitting: *Deleted

## 2017-08-14 MED ORDER — HYDROCHLOROTHIAZIDE 25 MG PO TABS: ORAL_TABLET | ORAL | 0 refills | Status: DC

## 2017-08-14 MED ORDER — AMLODIPINE BESYLATE 5 MG PO TABS: ORAL_TABLET | ORAL | 0 refills | Status: DC

## 2017-08-14 NOTE — Telephone Encounter (Signed)
Medication has been sent into pt's pharmacy. Called and spoke to pt's husband. Husband advised and voiced understanding.

## 2017-08-14 NOTE — Telephone Encounter (Signed)
Copied from [place]. Topic: General - Other >> Aug 14, 2017 11:45 AM Yvette Rack wrote: Reason for CRM: patient calling to get a 10 day RX on her amLODipine (NORVASC) 5 MG tablet  hydrochlorothiazide (HYDRODIURIL) 25 MG tablet until they come in the mail please send some in to [facility] [place] [place] [phone] (Phone) [phone] (Fax)      >> Aug 14, 2017 11:51 AM Yvette Rack wrote: Pt is out of these medicines she would like a rush on this [facility]

## 2017-08-16 DIAGNOSIS — L720 Epidermal cyst: Secondary | ICD-10-CM | POA: Diagnosis not present

## 2017-08-16 DIAGNOSIS — B353 Tinea pedis: Secondary | ICD-10-CM | POA: Diagnosis not present

## 2017-08-23 ENCOUNTER — Encounter: Payer: Self-pay | Admitting: Family Medicine

## 2017-08-24 DIAGNOSIS — H6523 Chronic serous otitis media, bilateral: Secondary | ICD-10-CM | POA: Diagnosis not present

## 2017-08-24 DIAGNOSIS — J301 Allergic rhinitis due to pollen: Secondary | ICD-10-CM | POA: Diagnosis not present

## 2017-08-24 DIAGNOSIS — B370 Candidal stomatitis: Secondary | ICD-10-CM | POA: Diagnosis not present

## 2017-08-27 NOTE — Telephone Encounter (Signed)
Have her see me OV. It could be that general surgery is the best place to go

## 2017-09-06 ENCOUNTER — Ambulatory Visit (INDEPENDENT_AMBULATORY_CARE_PROVIDER_SITE_OTHER): Payer: 59 | Admitting: Licensed Clinical Social Worker

## 2017-09-06 DIAGNOSIS — F324 Major depressive disorder, single episode, in partial remission: Secondary | ICD-10-CM | POA: Diagnosis not present

## 2017-10-24 DIAGNOSIS — Z1231 Encounter for screening mammogram for malignant neoplasm of breast: Secondary | ICD-10-CM | POA: Diagnosis not present

## 2017-11-19 ENCOUNTER — Ambulatory Visit (INDEPENDENT_AMBULATORY_CARE_PROVIDER_SITE_OTHER): Payer: 59

## 2017-11-19 ENCOUNTER — Ambulatory Visit (INDEPENDENT_AMBULATORY_CARE_PROVIDER_SITE_OTHER): Payer: 59 | Admitting: Podiatry

## 2017-11-19 ENCOUNTER — Encounter

## 2017-11-19 ENCOUNTER — Encounter: Payer: Self-pay | Admitting: Podiatry

## 2017-11-19 DIAGNOSIS — M205X1 Other deformities of toe(s) (acquired), right foot: Secondary | ICD-10-CM

## 2017-11-19 DIAGNOSIS — L600 Ingrowing nail: Secondary | ICD-10-CM

## 2017-11-19 NOTE — Patient Instructions (Signed)

## 2017-11-21 ENCOUNTER — Encounter: Payer: Self-pay | Admitting: Family Medicine

## 2017-12-08 ENCOUNTER — Other Ambulatory Visit: Payer: Self-pay | Admitting: Family Medicine

## 2017-12-11 NOTE — Telephone Encounter (Signed)
Last refill pt must have an OV for more refills

## 2017-12-20 ENCOUNTER — Ambulatory Visit (INDEPENDENT_AMBULATORY_CARE_PROVIDER_SITE_OTHER): Payer: 59 | Admitting: Family Medicine

## 2017-12-20 ENCOUNTER — Encounter: Payer: Self-pay | Admitting: Family Medicine

## 2017-12-20 VITALS — BP 130/70 | HR 68 | Temp 97.0°F | Ht 65.0 in | Wt 198.0 lb

## 2017-12-20 DIAGNOSIS — Z0000 Encounter for general adult medical examination without abnormal findings: Secondary | ICD-10-CM | POA: Diagnosis not present

## 2017-12-20 DIAGNOSIS — Z23 Encounter for immunization: Secondary | ICD-10-CM | POA: Diagnosis not present

## 2017-12-20 DIAGNOSIS — R739 Hyperglycemia, unspecified: Secondary | ICD-10-CM

## 2017-12-20 LAB — LIPID PANEL
Cholesterol: 196 mg/dL (ref 0–200)
HDL: 47.7 mg/dL (ref >39.00)
LDL CALC: 122 mg/dL — AB (ref 0–99)
NonHDL: 148.04
Total CHOL/HDL Ratio: 4
Triglycerides: 132 mg/dL (ref 0.0–149.0)
VLDL: 26.4 mg/dL (ref 0.0–40.0)

## 2017-12-20 LAB — POC URINALSYSI DIPSTICK (AUTOMATED)
Bilirubin, UA: NEGATIVE
Blood, UA: NEGATIVE
GLUCOSE UA: NEGATIVE
Ketones, UA: NEGATIVE
NITRITE UA: NEGATIVE
Protein, UA: POSITIVE — AB
Spec Grav, UA: >=1.03 — AB (ref 1.010–1.025)
UROBILINOGEN UA: 0.2 U/dL
pH, UA: 5.5 (ref 5.0–8.0)

## 2017-12-20 LAB — BASIC METABOLIC PANEL
BUN: 18 mg/dL (ref 6–23)
CALCIUM: 9.6 mg/dL (ref 8.4–10.5)
CO2: 31 meq/L (ref 19–32)
Chloride: 99 mEq/L (ref 96–112)
Creatinine, Ser: 0.94 mg/dL (ref 0.40–1.20)
GFR: 64.24 mL/min (ref >60.00)
Glucose, Bld: 106 mg/dL — ABNORMAL HIGH (ref 70–99)
POTASSIUM: 4.3 meq/L (ref 3.5–5.1)
SODIUM: 136 meq/L (ref 135–145)

## 2017-12-20 LAB — CBC WITH DIFFERENTIAL/PLATELET
Basophils Absolute: 0 10*3/uL (ref 0.0–0.1)
Basophils Relative: 0.4 % (ref 0.0–3.0)
EOS ABS: 0.1 10*3/uL (ref 0.0–0.7)
EOS PCT: 1.8 % (ref 0.0–5.0)
HEMATOCRIT: 41 % (ref 36.0–46.0)
Hemoglobin: 13.8 g/dL (ref 12.0–15.0)
LYMPHS PCT: 33.5 % (ref 12.0–46.0)
Lymphs Abs: 1.5 10*3/uL (ref 0.7–4.0)
MCHC: 33.6 g/dL (ref 30.0–36.0)
MCV: 88.5 fl (ref 78.0–100.0)
Monocytes Absolute: 0.3 10*3/uL (ref 0.1–1.0)
Monocytes Relative: 6.4 % (ref 3.0–12.0)
NEUTROS ABS: 2.6 10*3/uL (ref 1.4–7.7)
Neutrophils Relative %: 57.9 % (ref 43.0–77.0)
PLATELETS: 267 10*3/uL (ref 150.0–400.0)
RBC: 4.64 Mil/uL (ref 3.87–5.11)
RDW: 13.4 % (ref 11.5–15.5)
WBC: 4.4 10*3/uL (ref 4.0–10.5)

## 2017-12-20 LAB — HEPATIC FUNCTION PANEL
ALK PHOS: 65 U/L (ref 39–117)
ALT: 23 U/L (ref 0–35)
AST: 14 U/L (ref 0–37)
Albumin: 4.5 g/dL (ref 3.5–5.2)
BILIRUBIN DIRECT: 0.1 mg/dL (ref 0.0–0.3)
Total Bilirubin: 0.5 mg/dL (ref 0.2–1.2)
Total Protein: 7.4 g/dL (ref 6.0–8.3)

## 2017-12-20 LAB — TSH: TSH: 2.08 u[IU]/mL (ref 0.35–4.50)

## 2017-12-20 LAB — HEMOGLOBIN A1C: Hgb A1c MFr Bld: 6.1 % (ref 4.6–6.5)

## 2017-12-20 MED ORDER — CYCLOBENZAPRINE HCL 10 MG PO TABS: 10.0000 mg | ORAL_TABLET | Freq: Three times a day (TID) | ORAL | 3 refills | Status: DC | PRN

## 2017-12-20 MED ORDER — POTASSIUM CHLORIDE ER 10 MEQ PO TBCR: EXTENDED_RELEASE_TABLET | ORAL | 3 refills | Status: DC

## 2017-12-20 MED ORDER — AMLODIPINE BESYLATE 5 MG PO TABS: ORAL_TABLET | ORAL | 3 refills | Status: DC

## 2017-12-20 MED ORDER — BUPROPION HCL 75 MG PO TABS: 75.0000 mg | ORAL_TABLET | Freq: Every day | ORAL | 3 refills | Status: DC

## 2017-12-20 MED ORDER — ALPRAZOLAM 0.25 MG PO TABS: 0.2500 mg | ORAL_TABLET | Freq: Three times a day (TID) | ORAL | 5 refills | Status: DC | PRN

## 2017-12-20 MED ORDER — NEBIVOLOL HCL 10 MG PO TABS: 10.0000 mg | ORAL_TABLET | Freq: Every day | ORAL | 3 refills | Status: DC

## 2017-12-20 MED ORDER — HYDROCHLOROTHIAZIDE 25 MG PO TABS: ORAL_TABLET | ORAL | 3 refills | Status: DC

## 2017-12-20 NOTE — Progress Notes (Signed)
   Subjective:    Patient ID: Sherry Chapman, female    DOB: [birth year], 62 y.o.   MRN: [id number]  HPI Here for a well exam. She feels well but does want talk about Wellbutrin. She started on this 4 years ago for depression and it has been very effective. It has also helped with her focus on her job. However it as always caused her some diarrhea, and lately the diarrhea has been a problem. Her moods have been stable so she weaned herself off the Wellbutrin a few weeks ago. Now the diarrhea is controlled and her moods remain stable, but she finds it hard to focus at work.    Review of Systems  Constitutional: Negative.   HENT: Negative.   Eyes: Negative.   Respiratory: Negative.   Cardiovascular: Negative.   Gastrointestinal: Negative.   Genitourinary: Negative for decreased urine volume, difficulty urinating, dyspareunia, dysuria, enuresis, flank pain, frequency, hematuria, pelvic pain and urgency.  Musculoskeletal: Negative.   Skin: Negative.   Neurological: Negative.   Psychiatric/Behavioral: Positive for decreased concentration. Negative for confusion and dysphoric mood. The patient is not nervous/anxious.        Objective:   Physical Exam  Constitutional: She is oriented to person, place, and time. She appears well-developed and well-nourished. No distress.  HENT:  Head: Normocephalic and atraumatic.  Right Ear: External ear normal.  Left Ear: External ear normal.  Nose: Nose normal.  Mouth/Throat: Oropharynx is clear and moist. No oropharyngeal exudate.  Eyes: Pupils are equal, round, and reactive to light. Conjunctivae and EOM are normal. No scleral icterus.  Neck: Normal range of motion. Neck supple. No JVD present. No thyromegaly present.  Cardiovascular: Normal rate, regular rhythm, normal heart sounds and intact distal pulses. Exam reveals no gallop and no friction rub.  No murmur heard. Pulmonary/Chest: Effort normal and breath sounds normal. No respiratory distress. She  has no wheezes. She has no rales. She exhibits no tenderness.  Abdominal: Soft. Bowel sounds are normal. She exhibits no distension and no mass. There is no tenderness. There is no rebound and no guarding.  Musculoskeletal: Normal range of motion. She exhibits no edema or tenderness.  Lymphadenopathy:    She has no cervical adenopathy.  Neurological: She is alert and oriented to person, place, and time. She has normal reflexes. She displays normal reflexes. No cranial nerve deficit. She exhibits normal muscle tone. Coordination normal.  Skin: Skin is warm and dry. No rash noted. No erythema.  Psychiatric: She has a normal mood and affect. Her behavior is normal. Judgment and thought content normal.          Assessment & Plan:  Well exam. We discussed diet and exercise. Get fasting labs. She will try a reduced dose of Wellbutrin at 75 mg daily. Set up another colonoscopy.  Alysia Penna, MD

## 2017-12-24 ENCOUNTER — Encounter: Payer: Self-pay | Admitting: *Deleted

## 2017-12-24 NOTE — Telephone Encounter (Signed)
Dr. Sarajane Jews please advise. Thanks  Thanks...should I be getting a shingles vaccine as a precaution as I am62?

## 2017-12-24 NOTE — Telephone Encounter (Signed)
Yes she should get Shingrix. She can either come by and pick up a rx or we can fax one in to her pharmacy

## 2018-01-09 ENCOUNTER — Encounter: Payer: Self-pay | Admitting: Gastroenterology

## 2018-01-21 ENCOUNTER — Ambulatory Visit (INDEPENDENT_AMBULATORY_CARE_PROVIDER_SITE_OTHER): Payer: 59 | Admitting: Licensed Clinical Social Worker

## 2018-01-21 DIAGNOSIS — F324 Major depressive disorder, single episode, in partial remission: Secondary | ICD-10-CM

## 2018-01-23 DIAGNOSIS — R350 Frequency of micturition: Secondary | ICD-10-CM | POA: Diagnosis not present

## 2018-02-06 ENCOUNTER — Telehealth: Payer: Self-pay

## 2018-02-06 NOTE — Telephone Encounter (Signed)
Dr. Sarajane Jews please advise or do you need to see her.  thanks

## 2018-02-06 NOTE — Telephone Encounter (Signed)
Copied from [place] [id number]. Topic: General - Other >> Feb 06, 2018  3:06 PM Keene Breath wrote: Reason for CRM: Patient called to inform Dr. Sarajane Jews that she was diagnosed with a UTI from the doctor at [facility] and was given a prescription that cleared up the infection.  Patient stated that she went to the doctor there because it was more convenient at the time.  Since then, the infection has come back and patient wanted to know if Dr. Sarajane Jews would call her in something to relieve the infection, or if she has to come into the office to be examined.  Patient also wanted to know if she should go back to [facility] for further service.  Please advise.  CB# [phone]

## 2018-02-08 ENCOUNTER — Encounter: Payer: Self-pay | Admitting: Family Medicine

## 2018-02-08 NOTE — Telephone Encounter (Signed)
Dr Fry please advise. thanks 

## 2018-02-08 NOTE — Telephone Encounter (Signed)
Have her follow up with the [facility] doctor for this because they know what they gave her the last time and hopefully they got a culture on the urine

## 2018-02-11 ENCOUNTER — Encounter: Payer: Self-pay | Admitting: Family Medicine

## 2018-02-15 ENCOUNTER — Encounter: Payer: Self-pay | Admitting: Gastroenterology

## 2018-02-15 ENCOUNTER — Ambulatory Visit (AMBULATORY_SURGERY_CENTER): Payer: Self-pay | Admitting: *Deleted

## 2018-02-15 VITALS — Ht 65.5 in | Wt 201.0 lb

## 2018-02-15 DIAGNOSIS — Z1211 Encounter for screening for malignant neoplasm of colon: Secondary | ICD-10-CM

## 2018-02-15 MED ORDER — NA SULFATE-K SULFATE-MG SULF 17.5-3.13-1.6 GM/177ML PO SOLN: 1.0000 | Freq: Once | ORAL | 0 refills | Status: AC

## 2018-02-15 NOTE — Progress Notes (Signed)
No egg or soy allergy known to patient  No issues with past sedation with any surgeries  or procedures, no intubation problems  No diet pills per patient No home 02 use per patient  No blood thinners per patient  Pt denies issues with constipation  No A fib or A flutter  EMMI video sent to pt's e mail - pt declined  Suprep $15 coupon

## 2018-02-15 NOTE — Telephone Encounter (Signed)
I suggest she try Carvedilol 6.25 mg BID. She can check to see if this is on the new formulary

## 2018-02-19 NOTE — Telephone Encounter (Signed)
Completed.

## 2018-02-19 NOTE — Telephone Encounter (Signed)
noted 

## 2018-03-01 ENCOUNTER — Ambulatory Visit (AMBULATORY_SURGERY_CENTER): Payer: 59 | Admitting: Gastroenterology

## 2018-03-01 ENCOUNTER — Encounter: Payer: Self-pay | Admitting: Gastroenterology

## 2018-03-01 VITALS — BP 114/70 | HR 71 | Temp 98.0°F | Resp 11 | Ht 65.0 in | Wt 201.0 lb

## 2018-03-01 DIAGNOSIS — K635 Polyp of colon: Secondary | ICD-10-CM | POA: Diagnosis not present

## 2018-03-01 DIAGNOSIS — D128 Benign neoplasm of rectum: Secondary | ICD-10-CM

## 2018-03-01 DIAGNOSIS — D127 Benign neoplasm of rectosigmoid junction: Secondary | ICD-10-CM

## 2018-03-01 DIAGNOSIS — K621 Rectal polyp: Secondary | ICD-10-CM | POA: Diagnosis not present

## 2018-03-01 DIAGNOSIS — Z86010 Personal history of colonic polyps: Secondary | ICD-10-CM | POA: Diagnosis not present

## 2018-03-01 DIAGNOSIS — D122 Benign neoplasm of ascending colon: Secondary | ICD-10-CM

## 2018-03-01 DIAGNOSIS — Z1211 Encounter for screening for malignant neoplasm of colon: Secondary | ICD-10-CM | POA: Diagnosis not present

## 2018-03-01 HISTORY — PX: COLONOSCOPY: SHX174

## 2018-03-01 MED ORDER — SODIUM CHLORIDE 0.9 % IV SOLN: 500.0000 mL | Freq: Once | INTRAVENOUS | Status: DC

## 2018-03-01 NOTE — Patient Instructions (Signed)
Handouts provided:  Polyps, Diverticulosis, and Hemorrhoids  YOU HAD AN ENDOSCOPIC PROCEDURE TODAY AT [facility]:   Refer to the procedure report that was given to you for any specific questions about what was found during the examination.  If the procedure report does not answer your questions, please call your gastroenterologist to clarify.  If you requested that your care partner not be given the details of your procedure findings, then the procedure report has been included in a sealed envelope for you to review at your convenience later.  YOU SHOULD EXPECT: Some feelings of bloating in the abdomen. Passage of more gas than usual.  Walking can help get rid of the air that was put into your GI tract during the procedure and reduce the bloating. If you had a lower endoscopy (such as a colonoscopy or flexible sigmoidoscopy) you may notice spotting of blood in your stool or on the toilet paper. If you underwent a bowel prep for your procedure, you may not have a normal bowel movement for a few days.  Please Note:  You might notice some irritation and congestion in your nose or some drainage.  This is from the oxygen used during your procedure.  There is no need for concern and it should clear up in a day or so.  SYMPTOMS TO REPORT IMMEDIATELY:   Following lower endoscopy (colonoscopy or flexible sigmoidoscopy):  Excessive amounts of blood in the stool  Significant tenderness or worsening of abdominal pains  Swelling of the abdomen that is new, acute  Fever of 100F or higher  For urgent or emergent issues, a gastroenterologist can be reached at any hour by calling [phone].   DIET:  We do recommend a small meal at first, but then you may proceed to your regular diet.  Drink plenty of fluids but you should avoid alcoholic beverages for 24 hours.  ACTIVITY:  You should plan to take it easy for the rest of today and you should NOT DRIVE or use heavy machinery until tomorrow  (because of the sedation medicines used during the test).    FOLLOW UP: Our staff will call the number listed on your records the next business day following your procedure to check on you and address any questions or concerns that you may have regarding the information given to you following your procedure. If we do not reach you, we will leave a message.  However, if you are feeling well and you are not experiencing any problems, there is no need to return our call.  We will assume that you have returned to your regular daily activities without incident.  If any biopsies were taken you will be contacted by phone or by letter within the next 1-3 weeks.  Please call us at [phone] if you have not heard about the biopsies in 3 weeks.    SIGNATURES/CONFIDENTIALITY: You and/or your care partner have signed paperwork which will be entered into your electronic medical record.  These signatures attest to the fact that that the information above on your After Visit Summary has been reviewed and is understood.  Full responsibility of the confidentiality of this discharge information lies with you and/or your care-partner.

## 2018-03-01 NOTE — Op Note (Signed)
Sherry Chapman Procedure Date: 03/01/2018 9:36 AM MRN: [id number] Endoscopist: Remo Lipps P. Havery Moros , MD Age: 62 Referring MD:  Date of Birth: [birth year] Gender: Female Account #: [id number] Procedure:                Colonoscopy Indications:              Screening for colorectal malignant neoplasm Medicines:                Monitored Anesthesia Care Procedure:                Pre-Anesthesia Assessment:                           - Prior to the procedure, a History and Physical                            was performed, and patient medications and                            allergies were reviewed. The patient's tolerance of                            previous anesthesia was also reviewed. The risks                            and benefits of the procedure and the sedation                            options and risks were discussed with the patient.                            All questions were answered, and informed consent                            was obtained. Prior Anticoagulants: The patient has                            taken no previous anticoagulant or antiplatelet                            agents. ASA Grade Assessment: II - A patient with                            mild systemic disease. After reviewing the risks                            and benefits, the patient was deemed in                            satisfactory condition to undergo the procedure.                           After obtaining informed consent, the colonoscope  was passed under direct vision. Throughout the                            procedure, the patient's blood pressure, pulse, and                            oxygen saturations were monitored continuously. The                            Colonoscope was introduced through the anus and                            advanced to the the terminal ileum, with                            identification of the  appendiceal orifice and IC                            valve. The colonoscopy was performed without                            difficulty. The patient tolerated the procedure                            well. The quality of the bowel preparation was                            good. The terminal ileum, ileocecal valve,                            appendiceal orifice, and rectum were photographed. Scope In: 9:38:39 AM Scope Out: 10:01:53 AM Scope Withdrawal Time: 0 hours 19 minutes 25 seconds  Total Procedure Duration: 0 hours 23 minutes 14 seconds  Findings:                 The perianal and digital rectal examinations were                            normal.                           The terminal ileum appeared normal.                           Multiple small-mouthed diverticula were found in                            the left colon.                           A diminutive polyp was found in the ascending                            colon. The polyp was sessile. The polyp was removed  with a cold snare. Resection and retrieval were                            complete.                           A 3 mm polyp was found in the recto-sigmoid colon.                            The polyp was sessile. The polyp was removed with a                            cold snare. Resection and retrieval were complete.                           A 3 mm polyp was found in the rectum. The polyp was                            sessile. The polyp was removed with a cold snare.                            Resection and retrieval were complete.                           Internal hemorrhoids were found during retroflexion.                           The exam was otherwise without abnormality. The                            rectal vault was small. Complications:            No immediate complications. Estimated blood loss:                            Minimal. Estimated Blood Loss:     Estimated blood  loss was minimal. Impression:               - The examined portion of the ileum was normal.                           - Diverticulosis in the left colon.                           - One diminutive polyp in the ascending colon,                            removed with a cold snare. Resected and retrieved.                           - One 3 mm polyp at the recto-sigmoid colon,                            removed with a cold snare. Resected and retrieved.                           -  One 3 mm polyp in the rectum, removed with a cold                            snare. Resected and retrieved.                           - Internal hemorrhoids.                           - The examination was otherwise normal. Recommendation:           - Patient has a contact number available for                            emergencies. The signs and symptoms of potential                            delayed complications were discussed with the                            patient. Return to normal activities tomorrow.                            Written discharge instructions were provided to the                            patient.                           - Resume previous diet.                           - Continue present medications.                           - Await pathology results.                           - Patient endorses symptoms from hemorrhoids,                            consideration for banding if further therapy is                            desired. Remo Lipps P. Gweneth Fredlund, MD 03/01/2018 10:07:28 AM This report has been signed electronically.

## 2018-03-01 NOTE — Progress Notes (Signed)
Called to room to assist during endoscopic procedure.  Patient ID and intended procedure confirmed with present staff. Received instructions for my participation in the procedure from the performing physician.  

## 2018-03-01 NOTE — Progress Notes (Signed)
Report given to PACU, vss 

## 2018-03-04 ENCOUNTER — Telehealth: Payer: Self-pay | Admitting: *Deleted

## 2018-03-04 ENCOUNTER — Telehealth: Payer: Self-pay

## 2018-03-04 NOTE — Telephone Encounter (Signed)
Called and spoke to pt. Scheduled her for banding appts with Dr. Havery Moros.

## 2018-03-04 NOTE — Telephone Encounter (Signed)
First attempt, left VM.  

## 2018-03-04 NOTE — Telephone Encounter (Signed)
  Follow up Call-  Call back number 03/01/2018  Post procedure Call Back phone  # [phone]  Permission to leave phone message Yes  Some recent data might be hidden     Patient questions:  Do you have a fever, pain , or abdominal swelling? No. Pain Score  0 *  Have you tolerated food without any problems? Yes.    Have you been able to return to your normal activities? Yes.    Do you have any questions about your discharge instructions: Diet   No. Medications  No. Follow up visit  No.  Do you have questions or concerns about your Care? No.  Actions: * If pain score is 4 or above: No action needed, pain <4.

## 2018-03-04 NOTE — Telephone Encounter (Signed)
-----   Message from Yetta Flock, MD sent at 03/01/2018  4:51 PM EST ----- Regarding: banding Jan can you give this patient a call sometime in the next week to coordinate hemorrhoid banding? Thanks

## 2018-03-05 ENCOUNTER — Telehealth: Payer: Self-pay | Admitting: Gastroenterology

## 2018-03-05 NOTE — Telephone Encounter (Signed)
Spoke with pt and let her know that the results are not back yet and she will receive a letter when we get the results.

## 2018-03-05 NOTE — Telephone Encounter (Signed)
Pt called back wanting lab results.

## 2018-03-11 ENCOUNTER — Ambulatory Visit (INDEPENDENT_AMBULATORY_CARE_PROVIDER_SITE_OTHER): Payer: 59 | Admitting: Family Medicine

## 2018-03-11 ENCOUNTER — Encounter: Payer: Self-pay | Admitting: Family Medicine

## 2018-03-11 VITALS — BP 148/80 | HR 68 | Temp 98.1°F | Wt 199.6 lb

## 2018-03-11 DIAGNOSIS — N390 Urinary tract infection, site not specified: Secondary | ICD-10-CM

## 2018-03-11 DIAGNOSIS — F419 Anxiety disorder, unspecified: Secondary | ICD-10-CM

## 2018-03-11 MED ORDER — FLUOXETINE HCL 20 MG PO TABS: 20.0000 mg | ORAL_TABLET | Freq: Every day | ORAL | 3 refills | Status: DC

## 2018-03-11 MED ORDER — SULFAMETHOXAZOLE-TRIMETHOPRIM 800-160 MG PO TABS: 1.0000 | ORAL_TABLET | Freq: Two times a day (BID) | ORAL | 0 refills | Status: DC

## 2018-03-11 NOTE — Progress Notes (Signed)
   Subjective:    Patient ID: Sherry Chapman, female    DOB: [birth year], 62 y.o.   MRN: [id number]  HPI Here for several issues. First she developed urinary burning and urgency about 5 weeks ago, and she went to an urgent care clinic about 4 weeks ago. They diagnosed a UTI and she was given a week of Macrobid. She felt better for a few days but the symptoms returned. No fever or back pain. She is drinking plenty of fluids. Also her anxiety levels have been more of a problem over the past few months. She worries about things and she cannot relax. She has trouble sleeping. She denies sadness or depression symptoms.    Review of Systems  Constitutional: Negative.   Respiratory: Negative.   Cardiovascular: Negative.   Gastrointestinal: Negative.   Genitourinary: Positive for dysuria, frequency and urgency. Negative for hematuria.  Psychiatric/Behavioral: Positive for sleep disturbance. Negative for agitation, decreased concentration and dysphoric mood. The patient is nervous/anxious.        Objective:   Physical Exam  Constitutional: She is oriented to person, place, and time. She appears well-developed and well-nourished.  Cardiovascular: Normal rate, regular rhythm, normal heart sounds and intact distal pulses.  Pulmonary/Chest: Effort normal and breath sounds normal.  Abdominal: Soft. Bowel sounds are normal. She exhibits no distension and no mass. There is no tenderness. There is no rebound and no guarding.  Neurological: She is alert and oriented to person, place, and time.  Psychiatric: She has a normal mood and affect. Her behavior is normal. Thought content normal.          Assessment & Plan:  Partially treated UTI. She is unable to produce a urine sample today. Given 10 days of Bactrim DS. As for her anxiety we agreed to try Prozac 20 mg daily. Recheck in 3-4 weeks.  Alysia Penna, MD

## 2018-03-12 ENCOUNTER — Ambulatory Visit: Payer: 59 | Admitting: Licensed Clinical Social Worker

## 2018-03-13 ENCOUNTER — Ambulatory Visit (INDEPENDENT_AMBULATORY_CARE_PROVIDER_SITE_OTHER): Payer: 59 | Admitting: Women's Health

## 2018-03-13 ENCOUNTER — Encounter: Payer: Self-pay | Admitting: Women's Health

## 2018-03-13 VITALS — BP 128/80

## 2018-03-13 DIAGNOSIS — B373 Candidiasis of vulva and vagina: Secondary | ICD-10-CM | POA: Diagnosis not present

## 2018-03-13 DIAGNOSIS — N898 Other specified noninflammatory disorders of vagina: Secondary | ICD-10-CM | POA: Diagnosis not present

## 2018-03-13 DIAGNOSIS — R300 Dysuria: Secondary | ICD-10-CM

## 2018-03-13 DIAGNOSIS — B3731 Acute candidiasis of vulva and vagina: Secondary | ICD-10-CM

## 2018-03-13 LAB — WET PREP FOR TRICH, YEAST, CLUE

## 2018-03-13 MED ORDER — ESTRADIOL-NORETHINDRONE ACET 0.5-0.1 MG PO TABS: 1.0000 | ORAL_TABLET | Freq: Every day | ORAL | 4 refills | Status: DC

## 2018-03-13 MED ORDER — FLUCONAZOLE 150 MG PO TABS: ORAL_TABLET | ORAL | 0 refills | Status: DC

## 2018-03-13 NOTE — Progress Notes (Signed)
62 year old MWF G3 P2 presents with complaint of questionable vaginal infection.  Vaginal irritation with mild itching, no odor, for the past week that has progressively gotten worse.  Has had urinary burning especially at initiation of urination,  and discomfort with intercourse.  Was treated approximately 5 to 6 weeks ago with Macrodantin for UTI, was seen 2 days ago for UTI has completed 2 days of Septra per Dr. Sarajane Jews.  Denies abdominal/back pain, changes in GI or fever.  Postmenopausal on no HRT with no bleeding.  Medical problems include hypertension, anxiety/depression, GERD.  Exam: Appears well.  No CVAT.  External genitalia extremely erythematous at introitus, wet prep done with a Q-tip, wet prep positive for yeast. UA: Negative leukocytes, negative nitrites, 6-10 WBCs, few bacteria  Yeast vaginitis  Plan: Diflucan 150 p.o. today repeat in 3 days if needed.  Urine culture pending.  Instructed to complete Septra as prescribed.  Instructed to call if continued problems.

## 2018-03-13 NOTE — Patient Instructions (Signed)

## 2018-03-15 LAB — URINALYSIS, COMPLETE W/RFL CULTURE
Bilirubin Urine: NEGATIVE
Glucose, UA: NEGATIVE
Hgb urine dipstick: NEGATIVE
Hyaline Cast: NONE SEEN /LPF
Ketones, ur: NEGATIVE
Leukocyte Esterase: NEGATIVE
NITRITES URINE, INITIAL: NEGATIVE
PH: 6 (ref 5.0–8.0)
Protein, ur: NEGATIVE
RBC / HPF: NONE SEEN /HPF (ref 0–2)
Specific Gravity, Urine: 1.025 (ref 1.001–1.03)

## 2018-03-15 LAB — URINE CULTURE
MICRO NUMBER:: 91457447
Result:: NO GROWTH
SPECIMEN QUALITY:: ADEQUATE

## 2018-03-15 LAB — CULTURE INDICATED

## 2018-03-22 ENCOUNTER — Other Ambulatory Visit: Payer: Self-pay

## 2018-03-22 ENCOUNTER — Telehealth: Payer: Self-pay | Admitting: *Deleted

## 2018-03-22 MED ORDER — FLUCONAZOLE 150 MG PO TABS: ORAL_TABLET | ORAL | 0 refills | Status: DC

## 2018-03-22 NOTE — Telephone Encounter (Signed)
Stay with Monistat for now.  Would not expect dramatic results in 1 day.  I would complete the course of the medication if her symptoms still persist then office visit with Izora Gala

## 2018-03-22 NOTE — Telephone Encounter (Signed)
Patient informed. 

## 2018-03-22 NOTE — Telephone Encounter (Signed)
Patient saw Izora Gala on 03/13/18 prescribed diflucan 150 mg # 2 tablet, completed both pills, felt better while on medication,but now external/ internal itching and watery discharge, with vaginal burning started Monistat last night OTC but doesn't feel any better. No vaginal odor, no urinary symptoms. Patient wanted to know if you had any other recommendations? Please advise

## 2018-03-26 ENCOUNTER — Telehealth: Payer: Self-pay | Admitting: Gastroenterology

## 2018-03-26 NOTE — Telephone Encounter (Signed)
Called and LM for pt to call back to reschedule banding appts.

## 2018-03-26 NOTE — Telephone Encounter (Signed)
Pt called in needing to reschedule the appts. For 03/29/2018, 04/16/2018, 04/30/2018 for Sherry Chapman HEMORRHOID BANDING.

## 2018-03-28 NOTE — Telephone Encounter (Signed)
Called and LM for pt to call back regarding banding appts. Does she wish to cancel all 3 or just some of them?

## 2018-03-29 ENCOUNTER — Encounter: Payer: 59 | Admitting: Gastroenterology

## 2018-03-29 NOTE — Telephone Encounter (Signed)
Called and spoke to pt.  Cancelled 1st and 2nd appt.  Left 3rd appt in January that she said will work for her.  Will schedule 2nd and 3rd when February schedule opens.

## 2018-04-02 ENCOUNTER — Other Ambulatory Visit: Payer: Self-pay | Admitting: Family Medicine

## 2018-04-09 ENCOUNTER — Ambulatory Visit: Payer: Self-pay | Admitting: *Deleted

## 2018-04-09 MED ORDER — ALBUTEROL SULFATE HFA 108 (90 BASE) MCG/ACT IN AERS: 2.0000 | INHALATION_SPRAY | RESPIRATORY_TRACT | 0 refills | Status: DC | PRN

## 2018-04-09 NOTE — Telephone Encounter (Signed)
Message from Sherry Chapman sent at 04/09/2018 8:39 AM EST   Patient called to say that she is wheezing at night and is requesting an Rx for an albuterol (PROVENTIL HFA;VENTOLIN HFA) 108 (90 BASE) MCG/ACT inhaler sent to the Pharmacy [facility] on [address]. Stated that she have not had this in many years but is badly in need of an inhaler now because she is wheezing too much especially at night. Please advise Ph#  [phone]   Returned patient call regarding above message. She is wheezing at night and sometimes during the day.  Denies shortness of breath. No cold symptoms. No fever. Stated that she is susceptible to bronchitis. Stated last night the wheezing was worst. Has seen a pulmonologist in the past and had been on an inhaler. It was discontinued in 2013. Pt was told that she would have to be seen in the office because the medication is not on her med list. And her provider would need to assess her to determine what medication she would need for wheezing.  No appointment available today, she needs to be seen at an urgent care for wheezing. Pt refused. Is requesting a call back today regarding the inhaler.  Routing to flow at [facility] notified for review No protocol found.   Answer Assessment - Initial Assessment Questions 1. RESPIRATORY STATUS: "Describe your breathing?" (e.g., wheezing, shortness of breath, unable to speak, severe coughing)      Wheezing, worst at night 2. ONSET: "When did this breathing problem begin?"      2 days ago 3. PATTERN "Does the difficult breathing come and go, or has it been constant since it started?"      Comes and goes 4. SEVERITY: "How bad is your breathing?" (e.g., mild, moderate, severe)    - MILD: No SOB at rest, mild SOB with walking, speaks normally in sentences, can lay down, no retractions, pulse < 100.    - MODERATE: SOB at rest, SOB with minimal exertion and prefers to sit, cannot lie down flat, speaks in phrases, mild  retractions, audible wheezing, pulse 100-120.    - SEVERE: Very SOB at rest, speaks in single words, struggling to breathe, sitting hunched forward, retractions, pulse > 120      No shortness of breath 5. RECURRENT SYMPTOM: "Have you had difficulty breathing before?" If so, ask: "When was the last time?" and "What happened that time?"      yes 6. CARDIAC HISTORY: "Do you have any history of heart disease?" (e.g., heart attack, angina, bypass surgery, angioplasty)      no 7. LUNG HISTORY: "Do you have any history of lung disease?"  (e.g., pulmonary embolus, asthma, emphysema)     no 8. CAUSE: "What do you think is causing the breathing problem?"      Susceptible to bronchitis 9. OTHER SYMPTOMS: "Do you have any other symptoms? (e.g., dizziness, runny nose, cough, chest pain, fever)     Cough at times 10. PREGNANCY: "Is there any chance you are pregnant?" "When was your last menstrual period?"       no 11. TRAVEL: "Have you traveled out of the country in the last month?" (e.g., travel history, exposures)       no  Protocols used: BREATHING DIFFICULTY-A-AH

## 2018-04-09 NOTE — Addendum Note (Signed)
Addended by: Elie Confer on: 04/09/2018 12:40 PM   Modules accepted: Orders

## 2018-04-09 NOTE — Telephone Encounter (Signed)
Call in an albuterol inhaler to use 2 puffs every 4 hours prn SOB, #1 with no rf. She should see me sometime soon for this

## 2018-04-09 NOTE — Telephone Encounter (Signed)
Dr. Fry please advise. Thanks  

## 2018-04-09 NOTE — Telephone Encounter (Signed)
I have called the pt and lmom to make her aware of rx sent in and to make an appt with Dr. Sarajane Jews for this.

## 2018-04-10 ENCOUNTER — Ambulatory Visit (HOSPITAL_COMMUNITY)
Admission: EM | Admit: 2018-04-10 | Discharge: 2018-04-10 | Disposition: A | Discharge status: Home / Self Care | Payer: BLUE CROSS/BLUE SHIELD | Attending: Family Medicine | Admitting: Family Medicine

## 2018-04-10 ENCOUNTER — Other Ambulatory Visit: Payer: Self-pay

## 2018-04-10 ENCOUNTER — Encounter (HOSPITAL_COMMUNITY): Payer: Self-pay | Admitting: Emergency Medicine

## 2018-04-10 DIAGNOSIS — B9789 Other viral agents as the cause of diseases classified elsewhere: Secondary | ICD-10-CM | POA: Diagnosis not present

## 2018-04-10 DIAGNOSIS — R062 Wheezing: Secondary | ICD-10-CM | POA: Insufficient documentation

## 2018-04-10 DIAGNOSIS — J069 Acute upper respiratory infection, unspecified: Secondary | ICD-10-CM

## 2018-04-10 MED ORDER — ALBUTEROL SULFATE HFA 108 (90 BASE) MCG/ACT IN AERS: 2.0000 | INHALATION_SPRAY | RESPIRATORY_TRACT | 1 refills | Status: DC | PRN

## 2018-04-10 MED ORDER — HYDROCODONE-HOMATROPINE 5-1.5 MG/5ML PO SYRP: 5.0000 mL | ORAL_SOLUTION | Freq: Four times a day (QID) | ORAL | 0 refills | Status: DC | PRN

## 2018-04-10 NOTE — Discharge Instructions (Addendum)
Be aware, your cough medication may cause drowsiness. Please do not drive, operate heavy machinery or make important decisions while on this medication, it can cloud your judgement.  

## 2018-04-10 NOTE — ED Triage Notes (Signed)
Cough and wheezing for 3 days

## 2018-04-12 NOTE — ED Provider Notes (Signed)
[facility]   [id number] 04/10/18 Arrival Time: 8588  ASSESSMENT & PLAN:  1. Viral URI with cough   2. Wheezing    See AVS for d/c instructions.  No concern for pneumonia. She questions need for antibiotic. No indication for chest imaging at this time. Discussed  Meds ordered this encounter  Medications  . albuterol (PROVENTIL HFA;VENTOLIN HFA) 108 (90 Base) MCG/ACT inhaler    Sig: Inhale 2 puffs into the lungs every 4 (four) hours as needed for wheezing or shortness of breath.    Dispense:  1 Inhaler    Refill:  1  . HYDROcodone-homatropine (HYCODAN) 5-1.5 MG/5ML syrup    Sig: Take 5 mLs by mouth every 6 (six) hours as needed for cough.    Dispense:  90 mL    Refill:  0   Declines Rx prednisone. Cough medication sedation precautions. Discussed typical duration of symptoms. OTC symptom care as needed. Ensure adequate fluid intake and rest.  Follow-up Information    Laurey Morale, MD.   Specialty:  Family Medicine Why:  As needed. Contact information: [place] [address] [phone]          Reviewed expectations re: course of current medical issues. Questions answered. Outlined signs and symptoms indicating need for more acute intervention. Patient verbalized understanding. After Visit Summary given.   SUBJECTIVE: History from: patient.  Sherry Chapman is a 63 y.o. female who presents with complaint of nasal congestion, post-nasal drainage, and a persistent dry cough; without sore throat. Onset abrupt, about 3 days ago. Overall without fatigue and without body aches. SOB: none. Wheezing: moderate and sporadic. No chest pain reported.   Social History   Tobacco Use  Smoking Status Never Smoker  Smokeless Tobacco Never Used   Fever: yes, questions subjective. Overall normal PO intake without n/v. Known sick contacts: no. No specific or significant aggravating or alleviating factors reported. Ambulatory without difficulty. No  LE edema. OTC treatment: various cold remedies without much help.  Received flu shot this year: yes.  Social History   Tobacco Use  Smoking Status Never Smoker  Smokeless Tobacco Never Used   ROS: As per HPI. All other systems negative.   OBJECTIVE:  Vitals:   04/10/18 1652 04/10/18 1658  BP: (!) 152/78 140/80  Pulse: 78 73  Resp: 20 20  Temp: 98.7 F (37.1 C) (!) 97 F (36.1 C)  TempSrc: Oral Oral  SpO2: 98% 98%    General appearance: alert; appears fatigued HEENT: nasal congestion; clear runny nose; throat irritation secondary to post-nasal drainage Neck: supple without LAD CV: RRR Lungs: unlabored respirations, symmetrical air entry with mild expiratory wheezing bilaterally; cough: moderate Abd: soft Ext: no LE edema Skin: warm and dry Psychological: alert and cooperative; normal mood and affect   Allergies  Allergen Reactions  . Prednisone Anxiety    Insomnia, Hypersensitivity  . Clarithromycin     Biaxin - REACTION: Nausea  . Clindamycin/Lincomycin Diarrhea  . Inderal [Propranolol]     Chronic bronchitis   . Levofloxacin     REACTION: diarrhea????    Past Medical History:  Diagnosis Date  . Allergy   . Anemia    past hx   . Anxiety   . Arthritis    neck and upper back   . Back pain   . Benign neoplasm of adrenal gland   . Depression   . Diverticulosis    01-15-2008 colon and 01/2018 colon  . GERD (gastroesophageal reflux disease)   .  Hematuria, unspecified   . Hx of colonic polyps 2019  . Hyperlipidemia   . Hypertension   . Lactose intolerance    uses lactaid prn   . Neuromuscular disorder (HCC)    RLS   . Restless leg syndrome   . Ulnar abutment syndrome    bilaterally   . Vertigo    Family History  Problem Relation Age of Onset  . Heart disease Mother   . Hypertension Mother   . Diabetes Mother   . Alcohol abuse Father   . Coronary artery disease Father   . Hypertension Father   . Hypertension Sister   . Diabetes Sister   .  Hypertension Brother   . Diabetes Brother   . Heart disease Brother   . Aneurysm Brother   . Colon cancer Neg Hx   . Colon polyps Neg Hx   . Esophageal cancer Neg Hx   . Rectal cancer Neg Hx   . Stomach cancer Neg Hx    Social History   Socioeconomic History  . Marital status: Married    Spouse name: Not on file  . Number of children: 0  . Years of education: Not on file  . Highest education level: Not on file  Occupational History  . Occupation: Restaurant manager, fast food: [employer]  . Financial resource strain: Not on file  . Food insecurity:    Worry: Not on file    Inability: Not on file  . Transportation needs:    Medical: Not on file    Non-medical: Not on file  Tobacco Use  . Smoking status: Never Smoker  . Smokeless tobacco: Never Used  Substance and Sexual Activity  . Alcohol use: No    Alcohol/week: 0.0 standard drinks  . Drug use: No  . Sexual activity: Yes    Birth control/protection: Post-menopausal    Comment: 1st intercourse 63 yo-Fewer than5 partners  Lifestyle  . Physical activity:    Days per week: Not on file    Minutes per session: Not on file  . Stress: Not on file  Relationships  . Social connections:    Talks on phone: Not on file    Gets together: Not on file    Attends religious service: Not on file    Active member of club or organization: Not on file    Attends meetings of clubs or organizations: Not on file    Relationship status: Not on file  . Intimate partner violence:    Fear of current or ex partner: Not on file    Emotionally abused: Not on file    Physically abused: Not on file    Forced sexual activity: Not on file  Other Topics Concern  . Not on file  Social History Narrative  . Not on file           Vanessa Kick, MD 04/12/18 [phone]

## 2018-04-16 ENCOUNTER — Ambulatory Visit: Payer: 59 | Admitting: Licensed Clinical Social Worker

## 2018-04-16 ENCOUNTER — Encounter: Payer: Self-pay | Admitting: Gastroenterology

## 2018-04-30 ENCOUNTER — Encounter: Payer: Self-pay | Admitting: Gastroenterology

## 2018-05-02 ENCOUNTER — Encounter: Payer: Self-pay | Admitting: Gynecology

## 2018-05-02 ENCOUNTER — Ambulatory Visit (INDEPENDENT_AMBULATORY_CARE_PROVIDER_SITE_OTHER): Payer: BLUE CROSS/BLUE SHIELD | Admitting: Gynecology

## 2018-05-02 VITALS — BP 126/80 | Ht 65.5 in | Wt 198.0 lb

## 2018-05-02 DIAGNOSIS — Z01419 Encounter for gynecological examination (general) (routine) without abnormal findings: Secondary | ICD-10-CM

## 2018-05-02 DIAGNOSIS — N952 Postmenopausal atrophic vaginitis: Secondary | ICD-10-CM | POA: Diagnosis not present

## 2018-05-02 NOTE — Addendum Note (Signed)
Addended by: Nelva Nay on: 05/02/2018 12:31 PM   Modules accepted: Orders

## 2018-05-02 NOTE — Patient Instructions (Signed)
Followup for bone density as scheduled. 

## 2018-05-02 NOTE — Progress Notes (Signed)
    Sherry Chapman [birth year] [id number]        63 y.o.  [id number] for annual gynecologic exam.  Without gynecologic complaints  Past medical history,surgical history, problem list, medications, allergies, family history and social history were all reviewed and documented as reviewed in the EPIC chart.  ROS:  Performed with pertinent positives and negatives included in the history, assessment and plan.   Additional significant findings : None   Exam: [name] assistant Vitals:   05/02/18 1136  BP: 126/80  Weight: 198 lb (89.8 kg)  Height: 5' 5.5" (1.664 m)   Body mass index is 32.45 kg/m.  General appearance:  Normal affect, orientation and appearance. Skin: Grossly normal HEENT: Without gross lesions.  No cervical or supraclavicular adenopathy. Thyroid normal.  Lungs:  Clear without wheezing, rales or rhonchi Cardiac: RR, without RMG Abdominal:  Soft, nontender, without masses, guarding, rebound, organomegaly or hernia Breasts:  Examined lying and sitting without masses, retractions, discharge or axillary adenopathy. Pelvic:  Ext, BUS, Vagina: Normal with atrophic changes  Cervix: Normal with atrophic changes.  Pap smear done  Uterus: Anteverted, normal size, shape and contour, midline and mobile nontender   Adnexa: Without masses or tenderness    Anus and perineum: Normal   Rectovaginal: Normal sphincter tone without palpated masses or tenderness.    Assessment/Plan:  63 y.o. [id number] female for annual gynecologic exam.  1. Postmenopausal.  No significant menopausal symptoms or any vaginal bleeding. 2. Pap smear 2016.  Pap smear done today.  No history of abnormal Pap smears. 3. Mammography 10/2017.  Continue with annual mammography when due.  Breast exam normal today. 4. Colonoscopy 2019.  Repeat at their recommended interval. 5. DEXA never.  Recommended baseline DEXA now at age 57.  Patient agrees to schedule and follow-up for this. 6. Health maintenance.  No routine lab  work done as patient does this elsewhere.  Follow-up 1 year, sooner as needed.   Anastasio Auerbach MD, 12:08 PM 05/02/2018

## 2018-05-08 LAB — PAP IG W/ RFLX HPV ASCU

## 2018-05-14 ENCOUNTER — Encounter: Payer: Self-pay | Admitting: Gastroenterology

## 2018-05-20 ENCOUNTER — Other Ambulatory Visit: Payer: Self-pay | Admitting: Family Medicine

## 2018-05-31 ENCOUNTER — Encounter: Payer: Self-pay | Admitting: Gastroenterology

## 2018-06-06 ENCOUNTER — Encounter: Payer: Self-pay | Admitting: Family Medicine

## 2018-06-07 NOTE — Telephone Encounter (Signed)
Insurance likely will not cover it until age 63

## 2018-06-07 NOTE — Telephone Encounter (Signed)
Dr. Fry please advise. Thanks  

## 2018-06-18 ENCOUNTER — Encounter: Payer: Self-pay | Admitting: Gastroenterology

## 2018-06-23 ENCOUNTER — Other Ambulatory Visit: Payer: Self-pay | Admitting: Family Medicine

## 2018-06-26 ENCOUNTER — Other Ambulatory Visit: Payer: Self-pay | Admitting: Family Medicine

## 2018-07-02 ENCOUNTER — Encounter: Payer: Self-pay | Admitting: Gastroenterology

## 2018-07-02 ENCOUNTER — Other Ambulatory Visit: Payer: Self-pay | Admitting: Family Medicine

## 2018-07-03 NOTE — Telephone Encounter (Signed)
Dr. Fry please advise on refill of xanax.  Thanks  

## 2018-07-03 NOTE — Telephone Encounter (Signed)
Call in #90 with 5 rf 

## 2018-07-05 MED ORDER — ALPRAZOLAM 0.25 MG PO TABS: 0.2500 mg | ORAL_TABLET | Freq: Three times a day (TID) | ORAL | 5 refills | Status: DC | PRN

## 2018-07-05 NOTE — Telephone Encounter (Signed)
Rx done. 

## 2018-08-02 ENCOUNTER — Ambulatory Visit (INDEPENDENT_AMBULATORY_CARE_PROVIDER_SITE_OTHER): Payer: BLUE CROSS/BLUE SHIELD | Admitting: Family Medicine

## 2018-08-02 ENCOUNTER — Encounter: Payer: Self-pay | Admitting: Family Medicine

## 2018-08-02 ENCOUNTER — Other Ambulatory Visit: Payer: Self-pay

## 2018-08-02 ENCOUNTER — Telehealth: Payer: Self-pay | Admitting: Family Medicine

## 2018-08-02 DIAGNOSIS — N390 Urinary tract infection, site not specified: Secondary | ICD-10-CM

## 2018-08-02 MED ORDER — FLUCONAZOLE 150 MG PO TABS: 150.0000 mg | ORAL_TABLET | Freq: Once | ORAL | 5 refills | Status: AC

## 2018-08-02 MED ORDER — NITROFURANTOIN MONOHYD MACRO 100 MG PO CAPS: 100.0000 mg | ORAL_CAPSULE | Freq: Two times a day (BID) | ORAL | 0 refills | Status: DC

## 2018-08-02 NOTE — Telephone Encounter (Signed)
Pt now scheduled to see Dr. Sarajane Jews.  Nothing further needed.

## 2018-08-02 NOTE — Progress Notes (Signed)
Subjective:    Patient ID: Sherry Chapman, female    DOB: 10-Aug-1955, 63 y.o.   MRN: [id number]  HPI Virtual Visit via Video Note  I connected with the patient on 08/02/18 at 10:30 AM EDT by a video enabled telemedicine application and verified that I am speaking with the correct person using two identifiers.  Location patient: home Location provider:work or home office Persons participating in the virtual visit: patient, provider  I discussed the limitations of evaluation and management by telemedicine and the availability of in person appointments. The patient expressed understanding and agreed to proceed.   HPI: Here for 10 days of increased frequency and urgency of urination. Now the urine has a foul odor as well. No back pain or fever. She drinks plenty of water.    ROS: See pertinent positives and negatives per HPI.  Past Medical History:  Diagnosis Date  . Allergy   . Anemia    past hx   . Anxiety   . Arthritis    neck and upper back   . Back pain   . Benign neoplasm of adrenal gland   . Depression   . Diverticulosis    01-15-2008 colon and 01/2018 colon  . GERD (gastroesophageal reflux disease)   . Hematuria, unspecified   . Hx of colonic polyps 2019  . Hyperlipidemia   . Hypertension   . Lactose intolerance    uses lactaid prn   . Neuromuscular disorder (HCC)    RLS   . Restless leg syndrome   . Ulnar abutment syndrome    bilaterally   . Vertigo     Past Surgical History:  Procedure Laterality Date  . COLONOSCOPY  05-18-07   per Dr. Deatra Ina, repeat in 10 yrs   . ganglion cyst removed Left    06-05-2008 Dr Graylon Good- left wrist   . NASAL SINUS SURGERY     nasal reconstruction  . toe surgery      Family History  Problem Relation Age of Onset  . Heart disease Mother   . Hypertension Mother   . Diabetes Mother   . Alcohol abuse Father   . Coronary artery disease Father   . Hypertension Father   . Hypertension Sister   . Diabetes Sister   .  Hypertension Brother   . Diabetes Brother   . Heart disease Brother   . Aneurysm Brother   . Colon cancer Neg Hx   . Colon polyps Neg Hx   . Esophageal cancer Neg Hx   . Rectal cancer Neg Hx   . Stomach cancer Neg Hx      Current Outpatient Medications:  .  acetaminophen (TYLENOL) 325 MG tablet, Take 325 mg by mouth every 6 (six) hours as needed. , Disp: , Rfl:  .  albuterol (PROVENTIL HFA;VENTOLIN HFA) 108 (90 Base) MCG/ACT inhaler, Inhale 2 puffs into the lungs every 4 (four) hours as needed for wheezing or shortness of breath., Disp: 1 Inhaler, Rfl: 1 .  ALPRAZolam (XANAX) 0.25 MG tablet, Take 1 tablet (0.25 mg total) by mouth 3 (three) times daily as needed. for anxiety, Disp: 90 tablet, Rfl: 5 .  amLODipine (NORVASC) 5 MG tablet, TAKE ONE TABLET BY MOUTH DAILY, Disp: 90 tablet, Rfl: 1 .  BYSTOLIC 10 MG tablet, TAKE 1 TABLET BY MOUTH DAILY, Disp: 90 tablet, Rfl: 0 .  Famotidine (PEPCID AC PO), Take by mouth., Disp: , Rfl:  .  Fluoxetine HCl, PMDD, 20 MG TABS, TAKE 1 TABLET BY MOUTH  EVERY DAY, Disp: 90 tablet, Rfl: 1 .  hydrochlorothiazide (HYDRODIURIL) 25 MG tablet, TAKE 1 TABLET DAILY        (AUTHORIZATION IS REQUIRED FOR NEXT REFILL), Disp: 90 tablet, Rfl: 3 .  ketoconazole (NIZORAL) 2 % cream, APPLY TO AFFECTED AREA TWICE A DAY, Disp: , Rfl: 2 .  lactase (LACTAID) 3000 units tablet, Take by mouth 3 (three) times daily with meals., Disp: , Rfl:  .  MELATONIN PO, Take by mouth., Disp: , Rfl:  .  Multiple Vitamin (MULTIVITAMIN) tablet, Take 1 tablet by mouth daily., Disp: , Rfl:  .  Omega-3 Fatty Acids (FISH OIL) 1000 MG CAPS, Take by mouth., Disp: , Rfl:  .  potassium chloride (KLOR-CON 10) 10 MEQ tablet, TAKE 1 TABLET DAILY        (AUTHORIZATION IS REQUIRED FOR NEXT REFILL), Disp: 90 tablet, Rfl: 3 .  ranitidine (ZANTAC) 75 MG tablet, Take 75 mg by mouth daily., Disp: , Rfl:   EXAM:  VITALS per patient if applicable:  GENERAL: alert, oriented, appears well and in no acute  distress  HEENT: atraumatic, conjunttiva clear, no obvious abnormalities on inspection of external nose and ears  NECK: normal movements of the head and neck  LUNGS: on inspection no signs of respiratory distress, breathing rate appears normal, no obvious gross SOB, gasping or wheezing  CV: no obvious cyanosis  MS: moves all visible extremities without noticeable abnormality  PSYCH/NEURO: pleasant and cooperative, no obvious depression or anxiety, speech and thought processing grossly intact  ASSESSMENT AND PLAN: UTI, treat with Macrobid. Recheck prn.  Alysia Penna, MD  Discussed the following assessment and plan:  No diagnosis found.     I discussed the assessment and treatment plan with the patient. The patient was provided an opportunity to ask questions and all were answered. The patient agreed with the plan and demonstrated an understanding of the instructions.   The patient was advised to call back or seek an in-person evaluation if the symptoms worsen or if the condition fails to improve as anticipated.     Review of Systems     Objective:   Physical Exam        Assessment & Plan:

## 2018-08-02 NOTE — Telephone Encounter (Signed)
Copied from [place] [id number]. Topic: General - Other >> Aug 02, 2018  8:04 AM Keene Breath wrote: Reason for CRM: Patient called to inform the nurse or doctor that she thinks she might have a UTI.  Patient would like a call back to discuss and is not sure if she should come in or if he could call in a script.  Please advise and call patient back at [phone]

## 2018-10-30 ENCOUNTER — Telehealth: Payer: Self-pay | Admitting: *Deleted

## 2018-10-30 NOTE — Telephone Encounter (Signed)
Patient called to get advice from Dr. Sarajane Jews on whether or not she should be seen immediately for hemorrhoids  or could she wait until August 7th when she has another appointment with another doctor that can look at it. She is aware that there can be some damaging around the tissues. Please advise.

## 2018-11-01 NOTE — Telephone Encounter (Signed)
Left message for patient to call back. CRM created 

## 2018-11-01 NOTE — Telephone Encounter (Signed)
Unless they are bleeding heavily she can wait

## 2018-11-08 ENCOUNTER — Telehealth: Payer: Self-pay | Admitting: Gastroenterology

## 2018-11-08 NOTE — Telephone Encounter (Signed)
Called and scheduled pt for 3 banding appts with Dr. Havery Moros

## 2018-11-08 NOTE — Telephone Encounter (Signed)
Patient called said that she was previous scheduled for Hemorrhoid banding and was canceled. She is currently scheduled with Amy Esterwood on 8/07. She would like to know if she can just reschedule her bandings.

## 2018-11-15 ENCOUNTER — Ambulatory Visit: Payer: BLUE CROSS/BLUE SHIELD | Admitting: Physician Assistant

## 2018-11-19 ENCOUNTER — Encounter: Payer: Self-pay | Admitting: Family Medicine

## 2018-11-26 ENCOUNTER — Other Ambulatory Visit (INDEPENDENT_AMBULATORY_CARE_PROVIDER_SITE_OTHER): Payer: BC Managed Care – PPO

## 2018-11-26 ENCOUNTER — Ambulatory Visit (INDEPENDENT_AMBULATORY_CARE_PROVIDER_SITE_OTHER): Payer: BC Managed Care – PPO | Admitting: Gastroenterology

## 2018-11-26 ENCOUNTER — Encounter: Payer: Self-pay | Admitting: Gastroenterology

## 2018-11-26 ENCOUNTER — Other Ambulatory Visit: Payer: Self-pay

## 2018-11-26 VITALS — BP 134/86 | HR 68 | Temp 97.8°F | Ht 65.0 in | Wt 202.4 lb

## 2018-11-26 DIAGNOSIS — K909 Intestinal malabsorption, unspecified: Secondary | ICD-10-CM

## 2018-11-26 DIAGNOSIS — R195 Other fecal abnormalities: Secondary | ICD-10-CM | POA: Diagnosis not present

## 2018-11-26 DIAGNOSIS — K641 Second degree hemorrhoids: Secondary | ICD-10-CM

## 2018-11-26 DIAGNOSIS — K9049 Malabsorption due to intolerance, not elsewhere classified: Secondary | ICD-10-CM

## 2018-11-26 LAB — IGA: IgA: 317 mg/dL (ref 68–378)

## 2018-11-26 NOTE — Progress Notes (Signed)
   63 y/o female here for a follow up visit for banding. She had a colonoscopy done 03/01/18, with internal hemorrhoids noted. She is requesting band ligation for her hemorrhoids given longstanding symptoms of irritation, pruritus, occasional bleeding, and prolapse. She also has some dairy intolerance with loose stools, asking for further evaluation.   PROCEDURE NOTE: The patient presents with symptomatic grade II  hemorrhoids, requesting rubber band ligation of his/her hemorrhoidal disease.  All risks, benefits and alternative forms of therapy were described and informed consent was obtained.  In the Left Lateral Decubitus position anoscopic examination revealed grade II hemorrhoids in all positions.  The anorectum was pre-medicated with 0.125% nitroglycerin The decision was made to band the LL internal hemorrhoid, and the [place] was used to perform band ligation without complication.  Digital anorectal examination was then performed to assure proper positioning of the band, and to adjust the banded tissue as required.  The patient was discharged home without pain or other issues.  Dietary and behavioral recommendations were given and along with follow-up instructions.     The following adjunctive treatments were recommended: Will screen patient for celiac disease given bloating / loose stools / dairy intolerance.   The patient will return in 2-4 weeks for  follow-up and possible additional banding as required. No complications were encountered and the patient tolerated the procedure well.  Adams Cellar, MD [facility]

## 2018-11-26 NOTE — Patient Instructions (Addendum)
If you are age 62 or older, your body mass index should be between 23-30. Your Body mass index is 33.68 kg/m. If this is out of the aforementioned range listed, please consider follow up with your Primary Care Provider.  If you are age 58 or younger, your body mass index should be between 19-25. Your Body mass index is 33.68 kg/m. If this is out of the aformentioned range listed, please consider follow up with your Primary Care Provider.   To help prevent the possible spread of infection to our patients, communities, and staff; we will be implementing the following measures:  As of now we are not allowing any visitors/family members to accompany you to any upcoming appointments with [facility]. If you have any concerns about this please contact our office to discuss prior to the appointment.   Please go to the lab in the basement of our building to have lab work done as you leave today. Hit "B" for basement when you get on the elevator.  When the doors open the lab is on your left.  We will call you with the results. Thank you.    HEMORRHOID BANDING PROCEDURE    FOLLOW-UP CARE   1. The procedure you have had should have been relatively painless since the banding of the area involved does not have nerve endings and there is no pain sensation.  The rubber band cuts off the blood supply to the hemorrhoid and the band may fall off as soon as 48 hours after the banding (the band may occasionally be seen in the toilet bowl following a bowel movement). You may notice a temporary feeling of fullness in the rectum which should respond adequately to plain Tylenol or Motrin.  2. Following the banding, avoid strenuous exercise that evening and resume full activity the next day.  A sitz bath (soaking in a warm tub) or bidet is soothing, and can be useful for cleansing the area after bowel movements.     3. To avoid constipation, take two tablespoons of natural wheat bran, natural oat bran,  flax, Benefiber or any over the counter fiber supplement and increase your water intake to 7-8 glasses daily.    4. Unless you have been prescribed anorectal medication, do not put anything inside your rectum for two weeks: No suppositories, enemas, fingers, etc.  5. Occasionally, you may have more bleeding than usual after the banding procedure.  This is often from the untreated hemorrhoids rather than the treated one.  Don't be concerned if there is a tablespoon or so of blood.  If there is more blood than this, lie flat with your bottom higher than your head and apply an ice pack to the area. If the bleeding does not stop within a half an hour or if you feel faint, call our office at [phone] or go to the emergency room.  6. Problems are not common; however, if there is a substantial amount of bleeding, severe pain, chills, fever or difficulty passing urine (very rare) or other problems, you should call us at [phone] or report to the nearest emergency room.  7. Do not stay seated continuously for more than 2-3 hours for a day or two after the procedure.  Tighten your buttock muscles 10-15 times every two hours and take 10-15 deep breaths every 1-2 hours.  Do not spend more than a few minutes on the toilet if you cannot empty your bowel; instead re-visit the toilet at a later  time.    Thank you for entrusting me with your care and for choosing [facility], Dr. Aguada Cellar

## 2018-11-27 LAB — TISSUE TRANSGLUTAMINASE, IGA: (tTG) Ab, IgA: 3 U/mL

## 2018-12-12 ENCOUNTER — Other Ambulatory Visit: Payer: Self-pay

## 2018-12-12 ENCOUNTER — Ambulatory Visit (INDEPENDENT_AMBULATORY_CARE_PROVIDER_SITE_OTHER): Payer: BC Managed Care – PPO | Admitting: Gastroenterology

## 2018-12-12 ENCOUNTER — Encounter: Payer: Self-pay | Admitting: Gastroenterology

## 2018-12-12 VITALS — BP 130/76 | HR 80 | Temp 96.3°F | Ht 65.0 in | Wt 202.6 lb

## 2018-12-12 DIAGNOSIS — K641 Second degree hemorrhoids: Secondary | ICD-10-CM

## 2018-12-12 NOTE — Patient Instructions (Addendum)
If you are age 62 or older, your body mass index should be between 23-30. Your Body mass index is 33.71 kg/m. If this is out of the aforementioned range listed, please consider follow up with your Primary Care Provider.  If you are age 61 or younger, your body mass index should be between 19-25. Your Body mass index is 33.71 kg/m. If this is out of the aformentioned range listed, please consider follow up with your Primary Care Provider.    HEMORRHOID BANDING PROCEDURE    FOLLOW-UP CARE   1. The procedure you have had should have been relatively painless since the banding of the area involved does not have nerve endings and there is no pain sensation.  The rubber band cuts off the blood supply to the hemorrhoid and the band may fall off as soon as 48 hours after the banding (the band may occasionally be seen in the toilet bowl following a bowel movement). You may notice a temporary feeling of fullness in the rectum which should respond adequately to plain Tylenol or Motrin.  2. Following the banding, avoid strenuous exercise that evening and resume full activity the next day.  A sitz bath (soaking in a warm tub) or bidet is soothing, and can be useful for cleansing the area after bowel movements.     3. To avoid constipation, take two tablespoons of natural wheat bran, natural oat bran, flax, Benefiber or any over the counter fiber supplement and increase your water intake to 7-8 glasses daily.    4. Unless you have been prescribed anorectal medication, do not put anything inside your rectum for two weeks: No suppositories, enemas, fingers, etc.  5. Occasionally, you may have more bleeding than usual after the banding procedure.  This is often from the untreated hemorrhoids rather than the treated one.  Don't be concerned if there is a tablespoon or so of blood.  If there is more blood than this, lie flat with your bottom higher than your head and apply an ice pack to the area. If the  bleeding does not stop within a half an hour or if you feel faint, call our office at [phone] or go to the emergency room.  6. Problems are not common; however, if there is a substantial amount of bleeding, severe pain, chills, fever or difficulty passing urine (very rare) or other problems, you should call us at [phone] or report to the nearest emergency room.  7. Do not stay seated continuously for more than 2-3 hours for a day or two after the procedure.  Tighten your buttock muscles 10-15 times every two hours and take 10-15 deep breaths every 1-2 hours.  Do not spend more than a few minutes on the toilet if you cannot empty your bowel; instead re-visit the toilet at a later time.    We are giving you a Low FOD-MAP diet to follow.    You have your third banding appointment on Thursday, 12-26-18 at 4:00pm.   Thank you for entrusting me with your care and for choosing [facility], Dr. [place] Cellar

## 2018-12-12 NOTE — Progress Notes (Signed)
63 y/o female here for a follow up visit for banding. She had a colonoscopy done 03/01/18, with internal hemorrhoids noted. She had requested band ligation for her hemorrhoids given longstanding symptoms of irritation, pruritus, occasional bleeding, and prolapse. Banded LL hemorrhoid on 11/26/18   PROCEDURE NOTE: The patient presents with symptomatic grade II  hemorrhoids, requesting rubber band ligation of his/her hemorrhoidal disease.  All risks, benefits and alternative forms of therapy were described and informed consent was obtained.  The anorectum was pre-medicated with 0.125% nitroglycerin The decision was made to band the RP internal hemorrhoid, and the [place] was used to perform band ligation without complication.  Digital anorectal examination was then performed to assure proper positioning of the band, and to adjust the banded tissue as required. She had some sensitivity with the banding initially. I loosened the band which relieved her symptoms.  The patient was discharged home without pain or other issues.  Dietary and behavioral recommendations were given and along with follow-up instructions.     The following adjunctive treatments were recommended: She will continue to avoid dairy, use lactaid, also provided low FODMAP diet as she was interested in it If she has some sensitivity / spasm moving forward, recommend topical nitroglycerin ointment as needed  The patient will return in 2-4 weeks for  follow-up and possible additional banding as required. No complications were encountered and the patient tolerated the procedure well.  [place] Cellar, MD [facility]

## 2018-12-26 ENCOUNTER — Encounter: Payer: BLUE CROSS/BLUE SHIELD | Admitting: Gastroenterology

## 2019-01-03 ENCOUNTER — Other Ambulatory Visit: Payer: Self-pay | Admitting: Family Medicine

## 2019-01-06 ENCOUNTER — Other Ambulatory Visit: Payer: Self-pay | Admitting: Family Medicine

## 2019-01-06 NOTE — Telephone Encounter (Signed)
Requested medication (s) are due for refill today: yes  Requested medication (s) are on the active medication list: yes  Last refill:  06/23/2018  Future visit scheduled: yes  Notes to clinic:  Patient is out of medication and would like a 90 day supply   Requested Prescriptions  Pending Prescriptions Disp Refills   nebivolol (BYSTOLIC) 10 MG tablet 90 tablet 0    Sig: Take 1 tablet (10 mg total) by mouth daily.     Cardiovascular:  Beta Blockers Passed - 01/06/2019  8:40 AM      Passed - Last BP in normal range    BP Readings from Last 1 Encounters:  12/12/18 130/76         Passed - Last Heart Rate in normal range    Pulse Readings from Last 1 Encounters:  12/12/18 80         Passed - Valid encounter within last 6 months    Recent Outpatient Visits          5 months ago Urinary tract infection without hematuria, site unspecified   [facility] at [place], [place]   10 months ago Anxiety disorder, unspecified type   Therap[facility], [facility] at [facility], Ishmael Holter, MD   1 year ago Preventative health care   [facility] at [place], Ishmael Holter, MD   2 years ago Preventative health care   [place] [facility] [place], Ishmael Holter, MD   2 years ago Abdominal pain, bilateral upper quadrant   [place] at [facility], Alinda Sierras, MD      Future Appointments            In 1 month Laurey Morale, MD [place] [facility] [place], [facility]            amLODipine (NORVASC) 5 MG tablet 90 tablet 1    Sig: Take 1 tablet (5 mg total) by mouth daily.     Cardiovascular:  Calcium Channel Blockers Passed - 01/06/2019  8:40 AM      Passed - Last BP in normal range    BP Readings from Last 1 Encounters:  12/12/18 130/76         Passed - Valid encounter within last 6 months    Recent Outpatient Visits          5 months ago Urinary tract infection without hematuria, site unspecified   Therapist, [facility] at [place], MD   10 months ago Anxiety disorder, unspecified type   Therapist, music at [facility], Ishmael Holter, MD   1 year ago Preventative health care   [facility] at [place], Ishmael Holter, MD   2 years ago Preventative health care   [place] at [place], Ishmael Holter, MD   2 years ago Abdominal pain, bilateral upper quadrant   [place] at [facility], Alinda Sierras, MD      Future Appointments            In 1 month Laurey Morale, MD [place] at [place], [facility]            ALPRAZolam Duanne Moron) 0.25 MG tablet 90 tablet 5    Sig: Take 1 tablet (0.25 mg total) by mouth 3 (three) times daily as needed. for anxiety     Not Delegated - Psychiatry:  Anxiolytics/Hypnotics Failed - 01/06/2019  8:40 AM      Failed - This refill cannot be delegated  Failed - Urine Drug Screen completed in last 360 days.      Passed - Valid encounter within last 6 months    Recent Outpatient Visits          5 months ago Urinary tract infection without hematuria, site unspecified   Therapist, music at [facility], Ishmael Holter, MD   10 months ago Anxiety disorder, unspecified type   Therapist, music at [facility] [employer], Ishmael Holter, MD   1 year ago Preventative health care   [facility] at [place], Ishmael Holter, MD   2 years ago Preventative health care   [place] at [place], MD   2 years ago Abdominal pain, bilateral upper quadrant   [place] at [facility], Alinda Sierras, MD      Future Appointments            In 1 month Laurey Morale, MD [place] at [place], [address]            hydrochlorothiazide (HYDRODIURIL) 25 MG tablet 90 tablet 3    Sig: TAKE 1 TABLET DAILY        (AUTHORIZATION IS REQUIRED FOR NEXT REFILL)     Cardiovascular: Diuretics - Thiazide Failed - 01/06/2019  8:40 AM      Failed - Ca in normal range and within 360 days    Calcium  Date Value Ref Range Status   12/20/2017 9.6 8.4 - 10.5 mg/dL Final         Failed - Cr in normal range and within 360 days    Creatinine, Ser  Date Value Ref Range Status  12/20/2017 0.94 0.40 - 1.20 mg/dL Final         Failed - K in normal range and within 360 days    Potassium  Date Value Ref Range Status  12/20/2017 4.3 3.5 - 5.1 mEq/L Final         Failed - Na in normal range and within 360 days    Sodium  Date Value Ref Range Status  12/20/2017 136 135 - 145 mEq/L Final         Passed - Last BP in normal range    BP Readings from Last 1 Encounters:  12/12/18 130/76         Passed - Valid encounter within last 6 months    Recent Outpatient Visits          5 months ago Urinary tract infection without hematuria, site unspecified   [place] at [place], MD   10 months ago Anxiety disorder, unspecified type   Therapist, music at [facility] [employer], Ishmael Holter, MD   1 year ago Preventative health care   [place] at [place], Ishmael Holter, MD   2 years ago Preventative health care   [place] at [place], Ishmael Holter, MD   2 years ago Abdominal pain, bilateral upper quadrant   [place] at [facility], Alinda Sierras, MD      Future Appointments            In 1 month Sarajane Jews, Ish[place] Holter, MD [facility] [facility] at [place], [facility]

## 2019-01-06 NOTE — Telephone Encounter (Signed)
Copied from [place] [id number] [id number]. Topic: Quick Communication - Rx Refill/Question >> Jan 06, 2019  8:21 AM Leward Quan A wrote: Medication: amLODipine (NORVASC) 5 MG tablet,  hydrochlorothiazide (HYDRODIURIL) 25 MG tablet, ALPRAZolam (XANAX) 0.25 MG tablet, buPROPion (WELLBUTRIN) 75 MG tablet, BYSTOLIC 10 MG tablet   Per patient she is out of Bystolic and have been out all weekend asking for a 90 day supply of all meds called in today please  Has the patient contacted their pharmacy? Yes.   (Agent: If no, request that the patient contact the pharmacy for the refill.) (Agent: If yes, when and what did the pharmacy advise?)  Preferred Pharmacy (with phone number or street name): [id number] - [place], [place] [id number] (Phone) [phone] (Fax)      Agent: Please be advised that RX refills may take up to 3 business days. We ask that you follow-up with your pharmacy.

## 2019-01-07 ENCOUNTER — Encounter: Payer: Self-pay | Admitting: Gynecology

## 2019-01-07 MED ORDER — HYDROCHLOROTHIAZIDE 25 MG PO TABS: ORAL_TABLET | ORAL | 3 refills | Status: DC

## 2019-01-07 NOTE — Telephone Encounter (Signed)
Okay for refill?  

## 2019-01-09 MED ORDER — ALPRAZOLAM 0.25 MG PO TABS: 0.2500 mg | ORAL_TABLET | Freq: Three times a day (TID) | ORAL | 5 refills | Status: DC | PRN

## 2019-02-21 ENCOUNTER — Other Ambulatory Visit: Payer: Self-pay

## 2019-02-21 ENCOUNTER — Ambulatory Visit (INDEPENDENT_AMBULATORY_CARE_PROVIDER_SITE_OTHER): Payer: BC Managed Care – PPO | Admitting: Family Medicine

## 2019-02-21 ENCOUNTER — Encounter: Payer: Self-pay | Admitting: Family Medicine

## 2019-02-21 VITALS — BP 132/68 | HR 65 | Temp 97.6°F | Wt 187.6 lb

## 2019-02-21 DIAGNOSIS — R739 Hyperglycemia, unspecified: Secondary | ICD-10-CM | POA: Diagnosis not present

## 2019-02-21 DIAGNOSIS — Z0000 Encounter for general adult medical examination without abnormal findings: Secondary | ICD-10-CM | POA: Diagnosis not present

## 2019-02-21 MED ORDER — AMLODIPINE BESYLATE 5 MG PO TABS: 5.0000 mg | ORAL_TABLET | Freq: Every day | ORAL | 3 refills | Status: DC

## 2019-02-21 MED ORDER — NEBIVOLOL HCL 10 MG PO TABS: 10.0000 mg | ORAL_TABLET | Freq: Every day | ORAL | 3 refills | Status: DC

## 2019-02-21 MED ORDER — BUPROPION HCL ER (XL) 150 MG PO TB24: 150.0000 mg | ORAL_TABLET | Freq: Every day | ORAL | 3 refills | Status: DC

## 2019-02-21 NOTE — Progress Notes (Signed)
   Subjective:    Patient ID: Sherry Chapman, female    DOB: 1956/01/06, 63 y.o.   MRN: [id number]  HPI Here for a well exam. She feels great. She rejoined [employer] 2 months ago and since then she has lost 15 lbs.    Review of Systems  Constitutional: Negative.   HENT: Negative.   Eyes: Negative.   Respiratory: Negative.   Cardiovascular: Negative.   Gastrointestinal: Negative.   Genitourinary: Negative for decreased urine volume, difficulty urinating, dyspareunia, dysuria, enuresis, flank pain, frequency, hematuria, pelvic pain and urgency.  Musculoskeletal: Negative.   Skin: Negative.   Neurological: Negative.   Psychiatric/Behavioral: Negative.        Objective:   Physical Exam Constitutional:      General: She is not in acute distress.    Appearance: She is well-developed.  HENT:     Head: Normocephalic and atraumatic.     Right Ear: External ear normal.     Left Ear: External ear normal.     Nose: Nose normal.     Mouth/Throat:     Pharynx: No oropharyngeal exudate.  Eyes:     General: No scleral icterus.    Conjunctiva/sclera: Conjunctivae normal.     Pupils: Pupils are equal, round, and reactive to light.  Neck:     Musculoskeletal: Normal range of motion and neck supple.     Thyroid: No thyromegaly.     Vascular: No JVD.  Cardiovascular:     Rate and Rhythm: Normal rate and regular rhythm.     Heart sounds: Normal heart sounds. No murmur. No friction rub. No gallop.   Pulmonary:     Effort: Pulmonary effort is normal. No respiratory distress.     Breath sounds: Normal breath sounds. No wheezing or rales.  Chest:     Chest wall: No tenderness.  Abdominal:     General: Bowel sounds are normal. There is no distension.     Palpations: Abdomen is soft. There is no mass.     Tenderness: There is no abdominal tenderness. There is no guarding or rebound.  Musculoskeletal: Normal range of motion.        General: No tenderness.  Lymphadenopathy:   Cervical: No cervical adenopathy.  Skin:    General: Skin is warm and dry.     Findings: No erythema or rash.  Neurological:     Mental Status: She is alert and oriented to person, place, and time.     Cranial Nerves: No cranial nerve deficit.     Motor: No abnormal muscle tone.     Coordination: Coordination normal.     Deep Tendon Reflexes: Reflexes are normal and symmetric. Reflexes normal.  Psychiatric:        Behavior: Behavior normal.        Thought Content: Thought content normal.        Judgment: Judgment normal.           Assessment & Plan:  Well exam. We discussed diet and exercise. Get fasting labs soon.  Alysia Penna, MD

## 2019-02-25 ENCOUNTER — Other Ambulatory Visit (INDEPENDENT_AMBULATORY_CARE_PROVIDER_SITE_OTHER): Payer: BC Managed Care – PPO

## 2019-02-25 ENCOUNTER — Other Ambulatory Visit: Payer: Self-pay

## 2019-02-25 DIAGNOSIS — R739 Hyperglycemia, unspecified: Secondary | ICD-10-CM | POA: Diagnosis not present

## 2019-02-25 DIAGNOSIS — Z0000 Encounter for general adult medical examination without abnormal findings: Secondary | ICD-10-CM | POA: Diagnosis not present

## 2019-02-25 LAB — LIPID PANEL
Cholesterol: 183 mg/dL (ref 0–200)
HDL: 41.3 mg/dL (ref >39.00)
LDL Cholesterol: 111 mg/dL — ABNORMAL HIGH (ref 0–99)
NonHDL: 142.09
Total CHOL/HDL Ratio: 4
Triglycerides: 157 mg/dL — ABNORMAL HIGH (ref 0.0–149.0)
VLDL: 31.4 mg/dL (ref 0.0–40.0)

## 2019-02-25 LAB — CBC WITH DIFFERENTIAL/PLATELET
Basophils Absolute: 0 10*3/uL (ref 0.0–0.1)
Basophils Relative: 0.6 % (ref 0.0–3.0)
Eosinophils Absolute: 0.1 10*3/uL (ref 0.0–0.7)
Eosinophils Relative: 2.4 % (ref 0.0–5.0)
HCT: 41.2 % (ref 36.0–46.0)
Hemoglobin: 13.8 g/dL (ref 12.0–15.0)
Lymphocytes Relative: 34 % (ref 12.0–46.0)
Lymphs Abs: 1.6 10*3/uL (ref 0.7–4.0)
MCHC: 33.4 g/dL (ref 30.0–36.0)
MCV: 89.3 fl (ref 78.0–100.0)
Monocytes Absolute: 0.4 10*3/uL (ref 0.1–1.0)
Monocytes Relative: 7.4 % (ref 3.0–12.0)
Neutro Abs: 2.7 10*3/uL (ref 1.4–7.7)
Neutrophils Relative %: 55.6 % (ref 43.0–77.0)
Platelets: 255 10*3/uL (ref 150.0–400.0)
RBC: 4.61 Mil/uL (ref 3.87–5.11)
RDW: 13 % (ref 11.5–15.5)
WBC: 4.8 10*3/uL (ref 4.0–10.5)

## 2019-02-25 LAB — BASIC METABOLIC PANEL
BUN: 17 mg/dL (ref 6–23)
CO2: 30 mEq/L (ref 19–32)
Calcium: 9.2 mg/dL (ref 8.4–10.5)
Chloride: 101 mEq/L (ref 96–112)
Creatinine, Ser: 0.88 mg/dL (ref 0.40–1.20)
GFR: 64.97 mL/min (ref >60.00)
Glucose, Bld: 111 mg/dL — ABNORMAL HIGH (ref 70–99)
Potassium: 3.7 mEq/L (ref 3.5–5.1)
Sodium: 140 mEq/L (ref 135–145)

## 2019-02-25 LAB — HEPATIC FUNCTION PANEL
ALT: 13 U/L (ref 0–35)
AST: 11 U/L (ref 0–37)
Albumin: 4.4 g/dL (ref 3.5–5.2)
Alkaline Phosphatase: 65 U/L (ref 39–117)
Bilirubin, Direct: 0.1 mg/dL (ref 0.0–0.3)
Total Bilirubin: 0.5 mg/dL (ref 0.2–1.2)
Total Protein: 7.1 g/dL (ref 6.0–8.3)

## 2019-02-25 LAB — TSH: TSH: 3.29 u[IU]/mL (ref 0.35–4.50)

## 2019-02-25 LAB — HEMOGLOBIN A1C: Hgb A1c MFr Bld: 5.9 % (ref 4.6–6.5)

## 2019-03-30 ENCOUNTER — Other Ambulatory Visit: Payer: Self-pay | Admitting: Family Medicine

## 2019-05-08 ENCOUNTER — Encounter: Payer: BLUE CROSS/BLUE SHIELD | Admitting: Obstetrics and Gynecology

## 2019-05-16 ENCOUNTER — Telehealth: Payer: Self-pay | Admitting: Gastroenterology

## 2019-05-16 NOTE — Telephone Encounter (Signed)
Pt would like to schedule 3rd banding. Pls call her.

## 2019-05-19 NOTE — Telephone Encounter (Signed)
Yes that's fine, thanks

## 2019-05-28 ENCOUNTER — Telehealth: Payer: Self-pay | Admitting: Family Medicine

## 2019-05-28 NOTE — Telephone Encounter (Signed)
Set up an OV to discuss this

## 2019-05-28 NOTE — Telephone Encounter (Signed)
Patient is wanting to see if you can call her in something else to take besides buPROPion (WELLBUTRIN XL) 150 MG 24 hr tablet She said that this medication is irritating her tinnitus in her left ear. She said that it is helping her depression but not her anxiety. She was wondering if you can call her in a Rx for [place]. She is still taking    ALPRAZolam (XANAX) 0.25 MG tablet But as needed. She just wants you to call her in something else that is not addicting.    [id number] - [place], [place] - [address] AT [address] Phone:  [phone]  Fax:  [phone]     Please advise

## 2019-06-02 ENCOUNTER — Other Ambulatory Visit: Payer: Self-pay

## 2019-06-02 ENCOUNTER — Telehealth (INDEPENDENT_AMBULATORY_CARE_PROVIDER_SITE_OTHER): Payer: Self-pay | Admitting: Family Medicine

## 2019-06-02 DIAGNOSIS — F418 Other specified anxiety disorders: Secondary | ICD-10-CM | POA: Insufficient documentation

## 2019-06-02 MED ORDER — CITALOPRAM HYDROBROMIDE 20 MG PO TABS: 20.0000 mg | ORAL_TABLET | Freq: Every day | ORAL | 3 refills | Status: DC

## 2019-06-02 NOTE — Progress Notes (Signed)
Virtual Visit via Video Note  I connected with the patient on 06/02/19 at 11:30 AM EST by a video enabled telemedicine application and verified that I am speaking with the correct person using two identifiers.  Location patient: home Location provider:work or home office Persons participating in the virtual visit: patient, provider  I discussed the limitations of evaluation and management by telemedicine and the availability of in person appointments. The patient expressed understanding and agreed to proceed.   HPI: Here to discuss anxiety and depression. She has been dealing with these issues for years. She had taken Lexapro and Zoloft in the past, but these were stopped due to perceived side effects. Then she took Wellbutrin for awhile. She stopped taking this 3 months ago because she says it helped her depression but made her anxiety worse. She takes a Xanax once in awhile but she is worried about its potential for addiction. She sleeps fairly well.   ROS: See pertinent positives and negatives per HPI.  Past Medical History:  Diagnosis Date  . Allergy   . Anemia    past hx   . Anxiety   . Arthritis    neck and upper back   . Back pain   . Benign neoplasm of adrenal gland   . Depression   . Diverticulosis    01-15-2008 colon and 01/2018 colon  . GERD (gastroesophageal reflux disease)   . Hematuria, unspecified   . Hx of colonic polyps 2019  . Hyperlipidemia   . Hypertension   . Lactose intolerance    uses lactaid prn   . Neuromuscular disorder (HCC)    RLS   . Restless leg syndrome   . Ulnar abutment syndrome    bilaterally   . Vertigo     Past Surgical History:  Procedure Laterality Date  . COLONOSCOPY  03/01/2018   per Dr. Havery Moros, sessile serrated polyp, repeat in 5 yrs   . ganglion cyst removed Left    06-05-2008 Dr Graylon Good- left wrist   . NASAL SINUS SURGERY     nasal reconstruction  . toe surgery      Family History  Problem Relation Age of Onset  .  Heart disease Mother   . Hypertension Mother   . Diabetes Mother   . Alcohol abuse Father   . Coronary artery disease Father   . Hypertension Father   . Hypertension Sister   . Diabetes Sister   . Hypertension Brother   . Diabetes Brother   . Heart disease Brother   . Aneurysm Brother   . Colon cancer Neg Hx   . Colon polyps Neg Hx   . Esophageal cancer Neg Hx   . Rectal cancer Neg Hx   . Stomach cancer Neg Hx      Current Outpatient Medications:  .  acetaminophen (TYLENOL) 325 MG tablet, Take 325 mg by mouth every 6 (six) hours as needed. , Disp: , Rfl:  .  ALPRAZolam (XANAX) 0.25 MG tablet, Take 1 tablet (0.25 mg total) by mouth 3 (three) times daily as needed. for anxiety, Disp: 90 tablet, Rfl: 5 .  amLODipine (NORVASC) 5 MG tablet, TAKE 1 TABLET BY MOUTH EVERY DAY, Disp: 90 tablet, Rfl: 3 .  BYSTOLIC 10 MG tablet, TAKE 1 TABLET BY MOUTH EVERY DAY, Disp: 90 tablet, Rfl: 3 .  citalopram (CELEXA) 20 MG tablet, Take 1 tablet (20 mg total) by mouth daily., Disp: 30 tablet, Rfl: 3 .  Famotidine (PEPCID AC PO), Take by mouth., Disp: ,  Rfl:  .  hydrochlorothiazide (HYDRODIURIL) 25 MG tablet, TAKE 1 TABLET DAILY        (AUTHORIZATION IS REQUIRED FOR NEXT REFILL), Disp: 90 tablet, Rfl: 3 .  ketoconazole (NIZORAL) 2 % cream, APPLY TO AFFECTED AREA TWICE A DAY, Disp: , Rfl: 2 .  lactase (LACTAID) 3000 units tablet, Take by mouth 3 (three) times daily with meals., Disp: , Rfl:  .  Multiple Vitamin (MULTIVITAMIN) tablet, Take 1 tablet by mouth daily., Disp: , Rfl:  .  potassium chloride (KLOR-CON 10) 10 MEQ tablet, TAKE 1 TABLET DAILY        (AUTHORIZATION IS REQUIRED FOR NEXT REFILL), Disp: 90 tablet, Rfl: 3  EXAM:  VITALS per patient if applicable:  GENERAL: alert, oriented, appears well and in no acute distress  HEENT: atraumatic, conjunttiva clear, no obvious abnormalities on inspection of external nose and ears  NECK: normal movements of the head and neck  LUNGS: on inspection  no signs of respiratory distress, breathing rate appears normal, no obvious gross SOB, gasping or wheezing  CV: no obvious cyanosis  MS: moves all visible extremities without noticeable abnormality  PSYCH/NEURO: pleasant and cooperative, no obvious depression or anxiety, speech and thought processing grossly intact  ASSESSMENT AND PLAN: Depression with anxiety. She will try Celexa 20 mg daily. Recheck in 3-4 weeks.  Alysia Penna, MD  Discussed the following assessment and plan:  No diagnosis found.     I discussed the assessment and treatment plan with the patient. The patient was provided an opportunity to ask questions and all were answered. The patient agreed with the plan and demonstrated an understanding of the instructions.   The patient was advised to call back or seek an in-person evaluation if the symptoms worsen or if the condition fails to improve as anticipated.

## 2019-06-13 ENCOUNTER — Ambulatory Visit: Payer: BC Managed Care – PPO | Admitting: Gastroenterology

## 2019-06-13 ENCOUNTER — Encounter: Payer: Self-pay | Admitting: Gastroenterology

## 2019-06-13 VITALS — BP 138/88 | HR 82 | Temp 98.7°F | Ht 65.5 in | Wt 199.0 lb

## 2019-06-13 DIAGNOSIS — K641 Second degree hemorrhoids: Secondary | ICD-10-CM

## 2019-06-13 NOTE — Progress Notes (Signed)
64 y/o female here for a follow up visit for banding. She had a colonoscopy done 03/01/18, with internal hemorrhoids noted.  She is had chronic symptoms of hemorrhoids to include irritation pruritus occasional bleeding, occasional prolapse.  She underwent banding of the left lateral hemorrhoid on November 26, 2018 and right posterior hemorrhoid on January 01, 2019.  She tolerated both well.  She states this is provided significant benefit to her symptoms but she still has some mild symptoms occasionally on the right side which bothers her.  She is here to follow-up and complete her third banding.  She wanted to proceed after discussion risk and benefits      PROCEDURE NOTE: The patient presents with symptomatic grade II  hemorrhoids, requesting rubber band ligation of his/her hemorrhoidal disease.  All risks, benefits and alternative forms of therapy were described and informed consent was obtained.   The anorectum was pre-medicated with 0.125% nitroglycerin The decision was made to band the RA internal hemorrhoid, and the [place] was used to perform band ligation without complication.  Digital anorectal examination was then performed to assure proper positioning of the band, and to adjust the banded tissue as required.  The patient was discharged home without pain or other issues.  Dietary and behavioral recommendations were given and along with follow-up instructions.     The patient will return as needed for follow-up and possible additional banding as required. No complications were encountered and the patient tolerated the procedure well.  Houston Cellar, MD [facility]

## 2019-06-13 NOTE — Patient Instructions (Signed)
If you are age 63 or older, your body mass index should be between 23-30. Your Body mass index is 32.61 kg/m. If this is out of the aforementioned range listed, please consider follow up with your Primary Care Provider.  If you are age 3 or younger, your body mass index should be between 19-25. Your Body mass index is 32.61 kg/m. If this is out of the aformentioned range listed, please consider follow up with your Primary Care Provider.   HEMORRHOID BANDING PROCEDURE    FOLLOW-UP CARE   1. The procedure you have had should have been relatively painless since the banding of the area involved does not have nerve endings and there is no pain sensation.  The rubber band cuts off the blood supply to the hemorrhoid and the band may fall off as soon as 48 hours after the banding (the band may occasionally be seen in the toilet bowl following a bowel movement). You may notice a temporary feeling of fullness in the rectum which should respond adequately to plain Tylenol or Motrin.  2. Following the banding, avoid strenuous exercise that evening and resume full activity the next day.  A sitz bath (soaking in a warm tub) or bidet is soothing, and can be useful for cleansing the area after bowel movements.     3. To avoid constipation, take two tablespoons of natural wheat bran, natural oat bran, flax, Benefiber or any over the counter fiber supplement and increase your water intake to 7-8 glasses daily.    4. Unless you have been prescribed anorectal medication, do not put anything inside your rectum for two weeks: No suppositories, enemas, fingers, etc.  5. Occasionally, you may have more bleeding than usual after the banding procedure.  This is often from the untreated hemorrhoids rather than the treated one.  Don't be concerned if there is a tablespoon or so of blood.  If there is more blood than this, lie flat with your bottom higher than your head and apply an ice pack to the area. If the bleeding  does not stop within a half an hour or if you feel faint, call our office at [phone] or go to the emergency room.  6. Problems are not common; however, if there is a substantial amount of bleeding, severe pain, chills, fever or difficulty passing urine (very rare) or other problems, you should call us at [phone] or report to the nearest emergency room.  7. Do not stay seated continuously for more than 2-3 hours for a day or two after the procedure.  Tighten your buttock muscles 10-15 times every two hours and take 10-15 deep breaths every 1-2 hours.  Do not spend more than a few minutes on the toilet if you cannot empty your bowel; instead re-visit the toilet at a later time.    Thank you for entrusting me with your care and for choosing [facility], Dr. Ramsey Cellar

## 2019-06-24 ENCOUNTER — Other Ambulatory Visit: Payer: Self-pay | Admitting: Family Medicine

## 2019-07-28 ENCOUNTER — Telehealth: Payer: Self-pay | Admitting: Family Medicine

## 2019-07-28 NOTE — Telephone Encounter (Signed)
Last filled 12/20/2017 Last OV 06/01/2019 Rx not filled in over a year, should the patient continue this?

## 2019-07-28 NOTE — Telephone Encounter (Signed)
Medication:Potassium  [facility]    Pt is out of medication and is requesting it be filled as soon as possible.

## 2019-07-29 MED ORDER — POTASSIUM CHLORIDE ER 10 MEQ PO TBCR: EXTENDED_RELEASE_TABLET | ORAL | 3 refills | Status: DC

## 2019-07-29 NOTE — Telephone Encounter (Signed)
RX sent in. Patient is aware.

## 2019-07-29 NOTE — Telephone Encounter (Signed)
Call in #[phone] with 3 rf  

## 2019-08-28 ENCOUNTER — Other Ambulatory Visit: Payer: Self-pay

## 2019-08-28 ENCOUNTER — Encounter: Payer: Self-pay | Admitting: Podiatry

## 2019-08-28 ENCOUNTER — Ambulatory Visit (INDEPENDENT_AMBULATORY_CARE_PROVIDER_SITE_OTHER): Payer: BC Managed Care – PPO | Admitting: Podiatry

## 2019-08-28 ENCOUNTER — Ambulatory Visit (INDEPENDENT_AMBULATORY_CARE_PROVIDER_SITE_OTHER): Payer: BC Managed Care – PPO

## 2019-08-28 VITALS — Temp 97.6°F

## 2019-08-28 DIAGNOSIS — B351 Tinea unguium: Secondary | ICD-10-CM

## 2019-08-28 DIAGNOSIS — S90229A Contusion of unspecified lesser toe(s) with damage to nail, initial encounter: Secondary | ICD-10-CM

## 2019-08-28 DIAGNOSIS — L600 Ingrowing nail: Secondary | ICD-10-CM | POA: Diagnosis not present

## 2019-08-28 DIAGNOSIS — S90222D Contusion of left lesser toe(s) with damage to nail, subsequent encounter: Secondary | ICD-10-CM

## 2019-08-28 NOTE — Progress Notes (Signed)
Subjective:   Patient ID: Sherry Chapman, female   DOB: 64 y.o.   MRN: [id number]   HPI Patient presents stating that she traumatized her left fourth toe and its been bruised and painful several days ago and also that she has concerned about chronic fungal infection ingrown toenail right   Review of Systems  All other systems reviewed and are negative.       Objective:  Physical Exam Vitals and nursing note reviewed.  Constitutional:      Appearance: She is well-developed.  Pulmonary:     Effort: Pulmonary effort is normal.  Musculoskeletal:        General: Normal range of motion.  Skin:    General: Skin is warm.  Neurological:     Mental Status: She is alert.     Neurovascular status intact muscle strength was found to be adequate range of motion within normal limits.  Patient's fourth toe left is swollen and it is moderately painful with no overt indications of damage with patient found to have an incurvated medial border right hallux and thick nails 1-5 both feet along with some irritation of her skin bilateral.  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  I then advised the patient on immobilization is needed and I discussed nail fungus and discussed the considerations for antifungal Lamisil therapy and educated her on pros and cons and she wants to hold off currently.  Do not recommend correction of ingrown at the current time except for treatments  X-ray left did indicate suspicion of a fracture of the head of the proximal phalanx but nondisplaced and does not appear to be in the joint

## 2019-11-03 ENCOUNTER — Telehealth: Payer: Self-pay | Admitting: Family Medicine

## 2019-11-03 NOTE — Telephone Encounter (Signed)
She would need to see me first

## 2019-11-03 NOTE — Telephone Encounter (Signed)
Pt is wanting a appointment with a dermatologist or with Dr. Sarajane Jews about a spot on her nose.  Pt can be reached at [phone]  Please advise

## 2019-11-03 NOTE — Telephone Encounter (Signed)
Spoke with the pt and scheduled an appt for 7/28.

## 2019-11-05 ENCOUNTER — Encounter: Payer: Self-pay | Admitting: Family Medicine

## 2019-11-05 ENCOUNTER — Ambulatory Visit: Payer: BC Managed Care – PPO | Admitting: Family Medicine

## 2019-11-05 ENCOUNTER — Other Ambulatory Visit: Payer: Self-pay

## 2019-11-05 VITALS — BP 120/70 | HR 73 | Temp 98.3°F | Wt 204.8 lb

## 2019-11-05 DIAGNOSIS — L989 Disorder of the skin and subcutaneous tissue, unspecified: Secondary | ICD-10-CM

## 2019-11-05 NOTE — Progress Notes (Signed)
   Subjective:    Patient ID: Sherry Chapman, female    DOB: 02-19-1956, 64 y.o.   MRN: [id number]  HPI Here to check a lesion on the nose that has been present for over a year. However one week ago it turned red and looks irritated. She has already made herself an appt to see the [place] in early September. This is not painful or itchy.   Review of Systems  Constitutional: Negative.   Respiratory: Negative.   Cardiovascular: Negative.   Skin: Positive for color change.       Objective:   Physical Exam Constitutional:      Appearance: Normal appearance.  Cardiovascular:     Rate and Rhythm: Normal rate and regular rhythm.     Pulses: Normal pulses.     Heart sounds: Normal heart sounds.  Pulmonary:     Effort: Pulmonary effort is normal.     Breath sounds: Normal breath sounds.  Skin:    Comments: The tip of the nose has a slightly raised red smooth lesion  Neurological:     Mental Status: She is alert.           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 I encouraged her to keep the dermatology appt she has made.  Alysia Penna, MD

## 2020-01-05 ENCOUNTER — Other Ambulatory Visit: Payer: Self-pay | Admitting: Family Medicine

## 2020-01-19 ENCOUNTER — Ambulatory Visit: Payer: BC Managed Care – PPO | Admitting: Podiatry

## 2020-02-24 ENCOUNTER — Other Ambulatory Visit: Payer: Self-pay

## 2020-02-24 ENCOUNTER — Ambulatory Visit (INDEPENDENT_AMBULATORY_CARE_PROVIDER_SITE_OTHER): Payer: BC Managed Care – PPO | Admitting: Family Medicine

## 2020-02-24 ENCOUNTER — Encounter: Payer: Self-pay | Admitting: Family Medicine

## 2020-02-24 VITALS — BP 136/78 | HR 65 | Temp 98.0°F | Ht 65.0 in | Wt 198.6 lb

## 2020-02-24 DIAGNOSIS — Z23 Encounter for immunization: Secondary | ICD-10-CM | POA: Diagnosis not present

## 2020-02-24 DIAGNOSIS — Z0000 Encounter for general adult medical examination without abnormal findings: Secondary | ICD-10-CM

## 2020-02-24 MED ORDER — NEBIVOLOL HCL 10 MG PO TABS: 10.0000 mg | ORAL_TABLET | Freq: Every day | ORAL | 3 refills | Status: DC

## 2020-02-24 MED ORDER — AMLODIPINE BESYLATE 5 MG PO TABS: 5.0000 mg | ORAL_TABLET | Freq: Every day | ORAL | 3 refills | Status: DC

## 2020-02-24 MED ORDER — HYDROCHLOROTHIAZIDE 25 MG PO TABS: ORAL_TABLET | ORAL | 3 refills | Status: DC

## 2020-02-24 MED ORDER — ALPRAZOLAM 0.25 MG PO TABS: 0.2500 mg | ORAL_TABLET | Freq: Three times a day (TID) | ORAL | 5 refills | Status: DC | PRN

## 2020-02-24 NOTE — Addendum Note (Signed)
Addended by: Marrion Coy on: 02/24/2020 08:49 AM   Modules accepted: Orders

## 2020-02-24 NOTE — Progress Notes (Signed)
Subjective:    Patient ID: Sherry Chapman, female    DOB: 13-Feb-1956, 64 y.o.   MRN: [id number]  HPI Here for a well exam. She feels fine. She just retired last week and she says she will use her time now to get into better shape.    Review of Systems  Constitutional: Negative.  Negative for activity change, appetite change, diaphoresis, fatigue, fever and unexpected weight change.  HENT: Negative.  Negative for congestion, ear pain, hearing loss, nosebleeds, sore throat, tinnitus, trouble swallowing and voice change.   Eyes: Negative.  Negative for photophobia, pain, discharge, redness and visual disturbance.  Respiratory: Negative.  Negative for apnea, cough, choking, chest tightness, shortness of breath, wheezing and stridor.   Cardiovascular: Negative.  Negative for chest pain, palpitations and leg swelling.  Gastrointestinal: Negative.  Negative for abdominal distention, abdominal pain, blood in stool, constipation, diarrhea, nausea, rectal pain and vomiting.  Genitourinary: Negative.  Negative for difficulty urinating, dysuria, enuresis, flank pain, frequency, hematuria, menstrual problem, urgency, vaginal bleeding, vaginal discharge and vaginal pain.  Musculoskeletal: Negative.  Negative for arthralgias, back pain, gait problem, joint swelling, myalgias, neck pain and neck stiffness.  Skin: Negative.  Negative for color change, pallor, rash and wound.  Neurological: Negative.  Negative for dizziness, tremors, seizures, syncope, speech difficulty, weakness, light-headedness, numbness and headaches.  Hematological: Negative for adenopathy. Does not bruise/bleed easily.  Psychiatric/Behavioral: Negative.  Negative for agitation, behavioral problems, confusion, dysphoric mood, hallucinations and sleep disturbance. The patient is not nervous/anxious.        Objective:   Physical Exam Constitutional:      General: She is not in acute distress.    Appearance: She is well-developed.  HENT:      Head: Normocephalic and atraumatic.     Right Ear: External ear normal.     Left Ear: External ear normal.     Nose: Nose normal.     Mouth/Throat:     Pharynx: No oropharyngeal exudate.  Eyes:     General: No scleral icterus.    Conjunctiva/sclera: Conjunctivae normal.     Pupils: Pupils are equal, round, and reactive to light.  Neck:     Thyroid: No thyromegaly.     Vascular: No JVD.  Cardiovascular:     Rate and Rhythm: Normal rate and regular rhythm.     Heart sounds: Normal heart sounds. No murmur heard.  No friction rub. No gallop.   Pulmonary:     Effort: Pulmonary effort is normal. No respiratory distress.     Breath sounds: Normal breath sounds. No wheezing or rales.  Chest:     Chest wall: No tenderness.  Abdominal:     General: Bowel sounds are normal. There is no distension.     Palpations: Abdomen is soft. There is no mass.     Tenderness: There is no abdominal tenderness. There is no guarding or rebound.  Musculoskeletal:        General: No tenderness. Normal range of motion.     Cervical back: Normal range of motion and neck supple.  Lymphadenopathy:     Cervical: No cervical adenopathy.  Skin:    General: Skin is warm and dry.     Findings: No erythema or rash.  Neurological:     Mental Status: She is alert and oriented to person, place, and time.     Cranial Nerves: No cranial nerve deficit.     Motor: No abnormal muscle tone.     Coordination: Coordination  normal.     Deep Tendon Reflexes: Reflexes are normal and symmetric. Reflexes normal.  Psychiatric:        Behavior: Behavior normal.        Thought Content: Thought content normal.        Judgment: Judgment normal.           Assessment & Plan:  Well exam. We discussed diet and exercise. Get fasting labs.  Alysia Penna, MD

## 2020-02-24 NOTE — Addendum Note (Signed)
Addended by: Konrad Saha on: 02/24/2020 09:13 AM   Modules accepted: Orders

## 2020-02-25 LAB — BASIC METABOLIC PANEL WITH GFR
BUN/Creatinine Ratio: 16 (calc) (ref 6–22)
BUN: 16 mg/dL (ref 7–25)
CO2: 30 mmol/L (ref 20–32)
Calcium: 9.8 mg/dL (ref 8.6–10.4)
Chloride: 101 mmol/L (ref 98–110)
Creat: 1.02 mg/dL — ABNORMAL HIGH (ref 0.50–0.99)
GFR, Est African American: 68 mL/min/{1.73_m2} (ref >=60)
GFR, Est Non African American: 58 mL/min/{1.73_m2} — ABNORMAL LOW (ref >=60)
Glucose, Bld: 125 mg/dL — ABNORMAL HIGH (ref 65–99)
Potassium: 4.5 mmol/L (ref 3.5–5.3)
Sodium: 139 mmol/L (ref 135–146)

## 2020-02-25 LAB — CBC WITH DIFFERENTIAL/PLATELET
Absolute Monocytes: 291 cells/uL (ref 200–950)
Basophils Absolute: 39 cells/uL (ref 0–200)
Basophils Relative: 0.7 %
Eosinophils Absolute: 78 cells/uL (ref 15–500)
Eosinophils Relative: 1.4 %
HCT: 41.5 % (ref 35.0–45.0)
Hemoglobin: 13.8 g/dL (ref 11.7–15.5)
Lymphs Abs: 1333 cells/uL (ref 850–3900)
MCH: 29.6 pg (ref 27.0–33.0)
MCHC: 33.3 g/dL (ref 32.0–36.0)
MCV: 89.1 fL (ref 80.0–100.0)
MPV: 10.5 fL (ref 7.5–12.5)
Monocytes Relative: 5.2 %
Neutro Abs: 3858 cells/uL (ref 1500–7800)
Neutrophils Relative %: 68.9 %
Platelets: 323 10*3/uL (ref 140–400)
RBC: 4.66 10*6/uL (ref 3.80–5.10)
RDW: 12.6 % (ref 11.0–15.0)
Total Lymphocyte: 23.8 %
WBC: 5.6 10*3/uL (ref 3.8–10.8)

## 2020-02-25 LAB — HEPATIC FUNCTION PANEL
AG Ratio: 1.8 (calc) (ref 1.0–2.5)
ALT: 18 U/L (ref 6–29)
AST: 13 U/L (ref 10–35)
Albumin: 4.6 g/dL (ref 3.6–5.1)
Alkaline phosphatase (APISO): 62 U/L (ref 37–153)
Bilirubin, Direct: 0.1 mg/dL (ref 0.0–0.2)
Globulin: 2.6 g/dL (calc) (ref 1.9–3.7)
Indirect Bilirubin: 0.4 mg/dL (calc) (ref 0.2–1.2)
Total Bilirubin: 0.5 mg/dL (ref 0.2–1.2)
Total Protein: 7.2 g/dL (ref 6.1–8.1)

## 2020-02-25 LAB — LIPID PANEL
Cholesterol: 187 mg/dL (ref <200)
HDL: 51 mg/dL (ref >=50)
LDL Cholesterol (Calc): 114 mg/dL (calc) — ABNORMAL HIGH
Non-HDL Cholesterol (Calc): 136 mg/dL (calc) — ABNORMAL HIGH (ref <130)
Total CHOL/HDL Ratio: 3.7 (calc) (ref <5.0)
Triglycerides: 108 mg/dL (ref <150)

## 2020-02-25 LAB — HEMOGLOBIN A1C
Hgb A1c MFr Bld: 6.2 % of total Hgb — ABNORMAL HIGH (ref <5.7)
Mean Plasma Glucose: 131 (calc)
eAG (mmol/L): 7.3 (calc)

## 2020-02-25 LAB — TSH: TSH: 4.11 mIU/L (ref 0.40–4.50)

## 2020-03-16 ENCOUNTER — Telehealth: Payer: Self-pay | Admitting: Family Medicine

## 2020-03-16 NOTE — Telephone Encounter (Signed)
Patient called and stated that she recieved a letter for [employer] and wanted to see if Dr Sarajane Jews could write her a note to excuse her, please advise. CB is [phone]

## 2020-03-17 NOTE — Telephone Encounter (Signed)
Please advise message below. Okay for letter?

## 2020-03-18 NOTE — Telephone Encounter (Signed)
Called patient for letter pickup.   She will stop by office on tomorrow

## 2020-03-18 NOTE — Telephone Encounter (Signed)
The letter is ready to pick up  °

## 2020-03-25 NOTE — Telephone Encounter (Signed)
Patient is calling and wanted to see if the letter for jury duty can also state that she has diarrhea, please advise. CB is [phone]

## 2020-03-30 NOTE — Telephone Encounter (Signed)
Tell the letter I wrote will work just fine. We do this all the time

## 2020-03-30 NOTE — Telephone Encounter (Signed)
Unable to leave a message due to no voicemail being set up.

## 2020-04-03 ENCOUNTER — Other Ambulatory Visit: Payer: Self-pay | Admitting: Family Medicine

## 2020-04-08 ENCOUNTER — Other Ambulatory Visit: Payer: Self-pay | Admitting: Family Medicine

## 2020-07-14 ENCOUNTER — Other Ambulatory Visit: Payer: Self-pay | Admitting: Family Medicine

## 2020-08-31 ENCOUNTER — Telehealth: Payer: Self-pay

## 2020-08-31 ENCOUNTER — Encounter: Payer: Self-pay | Admitting: Family Medicine

## 2020-08-31 ENCOUNTER — Telehealth (INDEPENDENT_AMBULATORY_CARE_PROVIDER_SITE_OTHER): Payer: 59 | Admitting: Family Medicine

## 2020-08-31 VITALS — Wt 195.0 lb

## 2020-08-31 DIAGNOSIS — J019 Acute sinusitis, unspecified: Secondary | ICD-10-CM

## 2020-08-31 MED ORDER — AZITHROMYCIN 250 MG PO TABS: ORAL_TABLET | ORAL | 0 refills | Status: DC

## 2020-08-31 MED ORDER — HYDROCODONE BIT-HOMATROP MBR 5-1.5 MG/5ML PO SOLN: 5.0000 mL | ORAL | 0 refills | Status: DC | PRN

## 2020-08-31 NOTE — Progress Notes (Signed)
Subjective:    Patient ID: Sherry Chapman, female    DOB: February 26, 1956, 65 y.o.   MRN: [id number]  HPI Virtual Visit via Video Note  I connected with the patient on 08/31/20 at  2:15 PM EDT by a video enabled telemedicine application and verified that I am speaking with the correct person using two identifiers.  Location patient: home Location provider:work or home office Persons participating in the virtual visit: patient, provider  I discussed the limitations of evaluation and management by telemedicine and the availability of in person appointments. The patient expressed understanding and agreed to proceed.   HPI: Here for 4 days of sinus pressure, PND, and coughing up green sputum. No fever or body aches. No NVD. She has tested negative for the Covid virus 3 times in the past 4 days. She has tried Robitussin DM with no relief. No SOB or chest pain.   ROS: See pertinent positives and negatives per HPI.  Past Medical History:  Diagnosis Date  . Allergy   . Anemia    past hx   . Anxiety   . Arthritis    neck and upper back   . Back pain   . Benign neoplasm of adrenal gland   . Depression   . Diverticulosis    01-15-2008 colon and 01/2018 colon  . GERD (gastroesophageal reflux disease)   . Hematuria, unspecified   . Hx of colonic polyps 2019  . Hyperlipidemia   . Hypertension   . Lactose intolerance    uses lactaid prn   . Neuromuscular disorder (HCC)    RLS   . Restless leg syndrome   . Ulnar abutment syndrome    bilaterally   . Vertigo     Past Surgical History:  Procedure Laterality Date  . COLONOSCOPY  03/01/2018   per Dr. Havery Moros, sessile serrated polyp, repeat in 5 yrs   . ganglion cyst removed Left    06-05-2008 Dr Graylon Good- left wrist   . NASAL SINUS SURGERY     nasal reconstruction  . toe surgery      Family History  Problem Relation Age of Onset  . Heart disease Mother   . Hypertension Mother   . Diabetes Mother   . Alcohol abuse Father   .  Coronary artery disease Father   . Hypertension Father   . Hypertension Sister   . Diabetes Sister   . Hypertension Brother   . Diabetes Brother   . Heart disease Brother   . Aneurysm Brother   . Colon cancer Neg Hx   . Colon polyps Neg Hx   . Esophageal cancer Neg Hx   . Rectal cancer Neg Hx   . Stomach cancer Neg Hx      Current Outpatient Medications:  .  acetaminophen (TYLENOL) 325 MG tablet, Take 325 mg by mouth every 6 (six) hours as needed. , Disp: , Rfl:  .  ALPRAZolam (XANAX) 0.25 MG tablet, Take 1 tablet (0.25 mg total) by mouth 3 (three) times daily as needed. for anxiety, Disp: 90 tablet, Rfl: 5 .  amLODipine (NORVASC) 5 MG tablet, TAKE 1 TABLET BY MOUTH EVERY DAY, Disp: 90 tablet, Rfl: 3 .  azithromycin (ZITHROMAX Z-PAK) 250 MG tablet, As directed, Disp: 6 each, Rfl: 0 .  Famotidine (PEPCID AC PO), Take by mouth., Disp: , Rfl:  .  hydrochlorothiazide (HYDRODIURIL) 25 MG tablet, TAKE 1 TABLET DAILY (AUTHORIZATION IS REQUIRED FOR NEXT REFILL), Disp: 90 tablet, Rfl: 3 .  HYDROcodone bit-homatropine (HYCODAN)  5-1.5 MG/5ML syrup, Take 5 mLs by mouth every 4 (four) hours as needed for cough., Disp: 240 mL, Rfl: 0 .  lactase (LACTAID) 3000 units tablet, Take by mouth 3 (three) times daily with meals., Disp: , Rfl:  .  Multiple Vitamin (MULTIVITAMIN) tablet, Take 1 tablet by mouth daily., Disp: , Rfl:  .  nebivolol (BYSTOLIC) 10 MG tablet, Take 1 tablet (10 mg total) by mouth daily., Disp: 90 tablet, Rfl: 3 .  potassium chloride (KLOR-CON 10) 10 MEQ tablet, TAKE 1 TABLET DAILY        (AUTHORIZATION IS REQUIRED FOR NEXT REFILL), Disp: 90 tablet, Rfl: 3  EXAM:  VITALS per patient if applicable:  GENERAL: alert, oriented, appears well and in no acute distress  HEENT: atraumatic, conjunttiva clear, no obvious abnormalities on inspection of external nose and ears  NECK: normal movements of the head and neck  LUNGS: on inspection no signs of respiratory distress, breathing rate  appears normal, no obvious gross SOB, gasping or wheezing  CV: no obvious cyanosis  MS: moves all visible extremities without noticeable abnormality  PSYCH/NEURO: pleasant and cooperative, no obvious depression or anxiety, speech and thought processing grossly intact  ASSESSMENT AND PLAN: Sinusitis, treat with a Zpack. Use Hycodan as needed.  Alysia Penna, MD  Discussed the following assessment and plan:  No diagnosis found.     I discussed the assessment and treatment plan with the patient. The patient was provided an opportunity to ask questions and all were answered. The patient agreed with the plan and demonstrated an understanding of the instructions.   The patient was advised to call back or seek an in-person evaluation if the symptoms worsen or if the condition fails to improve as anticipated.     Review of Systems     Objective:   Physical Exam        Assessment & Plan:

## 2020-08-31 NOTE — Telephone Encounter (Signed)
Insurance information

## 2020-09-21 NOTE — Telephone Encounter (Signed)
Added insurance to patient record.

## 2020-10-16 ENCOUNTER — Other Ambulatory Visit: Payer: Self-pay | Admitting: Family Medicine

## 2020-12-15 ENCOUNTER — Encounter: Payer: Self-pay | Admitting: Family Medicine

## 2020-12-15 ENCOUNTER — Ambulatory Visit (INDEPENDENT_AMBULATORY_CARE_PROVIDER_SITE_OTHER): Payer: 59 | Admitting: Family Medicine

## 2020-12-15 ENCOUNTER — Other Ambulatory Visit: Payer: Self-pay

## 2020-12-15 VITALS — BP 130/86 | HR 78 | Temp 98.0°F | Wt 197.0 lb

## 2020-12-15 DIAGNOSIS — R300 Dysuria: Secondary | ICD-10-CM

## 2020-12-15 DIAGNOSIS — N390 Urinary tract infection, site not specified: Secondary | ICD-10-CM | POA: Diagnosis not present

## 2020-12-15 LAB — POC URINALSYSI DIPSTICK (AUTOMATED)
Bilirubin, UA: NEGATIVE
Blood, UA: NEGATIVE
Glucose, UA: NEGATIVE
Ketones, UA: NEGATIVE
Leukocytes, UA: NEGATIVE
Nitrite, UA: NEGATIVE
Protein, UA: POSITIVE — AB
Spec Grav, UA: >=1.03 — AB (ref 1.010–1.025)
Urobilinogen, UA: 0.2 E.U./dL
pH, UA: 6 (ref 5.0–8.0)

## 2020-12-15 MED ORDER — SULFAMETHOXAZOLE-TRIMETHOPRIM 800-160 MG PO TABS: 1.0000 | ORAL_TABLET | Freq: Two times a day (BID) | ORAL | 0 refills | Status: DC

## 2020-12-15 NOTE — Progress Notes (Signed)
   Subjective:    Patient ID: Sherry Chapman, female    DOB: Nov 06, 1955, 65 y.o.   MRN: [id number]  HPI Here for 2 days of urinary burning and urgency. No fever or nausea or back pain. Drinking lots of water.    Review of Systems  Constitutional: Negative.   Respiratory: Negative.    Cardiovascular: Negative.   Gastrointestinal: Negative.   Genitourinary:  Positive for dysuria, frequency and urgency. Negative for flank pain and hematuria.      Objective:   Physical Exam Constitutional:      General: She is not in acute distress.    Appearance: Normal appearance.  Cardiovascular:     Rate and Rhythm: Normal rate and regular rhythm.     Pulses: Normal pulses.     Heart sounds: Normal heart sounds.  Pulmonary:     Effort: Pulmonary effort is normal.     Breath sounds: Normal breath sounds.  Abdominal:     General: Abdomen is flat. Bowel sounds are normal. There is no distension.     Palpations: Abdomen is soft. There is no mass.     Tenderness: There is no abdominal tenderness. There is no right CVA tenderness, left CVA tenderness, guarding or rebound.     Hernia: No hernia is present.  Neurological:     Mental Status: She is alert.          Assessment & Plan:  UTI, treat with 7 days of Bactrim DS. Culture the sample.  Alysia Penna, MD

## 2020-12-16 LAB — URINE CULTURE
MICRO NUMBER:: 12341631
SPECIMEN QUALITY:: ADEQUATE

## 2021-02-24 ENCOUNTER — Encounter: Payer: 59 | Admitting: Family Medicine

## 2021-03-24 ENCOUNTER — Encounter: Payer: 59 | Admitting: Family Medicine

## 2021-03-28 ENCOUNTER — Encounter: Payer: Self-pay | Admitting: Family Medicine

## 2021-03-28 ENCOUNTER — Ambulatory Visit (INDEPENDENT_AMBULATORY_CARE_PROVIDER_SITE_OTHER): Payer: 59 | Admitting: Family Medicine

## 2021-03-28 VITALS — BP 128/82 | HR 65 | Temp 97.8°F | Ht 65.5 in | Wt 195.0 lb

## 2021-03-28 DIAGNOSIS — Z23 Encounter for immunization: Secondary | ICD-10-CM

## 2021-03-28 DIAGNOSIS — Z0000 Encounter for general adult medical examination without abnormal findings: Secondary | ICD-10-CM | POA: Diagnosis not present

## 2021-03-28 DIAGNOSIS — H029 Unspecified disorder of eyelid: Secondary | ICD-10-CM

## 2021-03-28 LAB — CBC WITH DIFFERENTIAL/PLATELET
Basophils Absolute: 0.1 10*3/uL (ref 0.0–0.1)
Basophils Relative: 0.9 % (ref 0.0–3.0)
Eosinophils Absolute: 0.1 10*3/uL (ref 0.0–0.7)
Eosinophils Relative: 2.5 % (ref 0.0–5.0)
HCT: 41.8 % (ref 36.0–46.0)
Hemoglobin: 13.8 g/dL (ref 12.0–15.0)
Lymphocytes Relative: 29.7 % (ref 12.0–46.0)
Lymphs Abs: 1.6 10*3/uL (ref 0.7–4.0)
MCHC: 33 g/dL (ref 30.0–36.0)
MCV: 89.2 fl (ref 78.0–100.0)
Monocytes Absolute: 0.4 10*3/uL (ref 0.1–1.0)
Monocytes Relative: 6.5 % (ref 3.0–12.0)
Neutro Abs: 3.3 10*3/uL (ref 1.4–7.7)
Neutrophils Relative %: 60.4 % (ref 43.0–77.0)
Platelets: 280 10*3/uL (ref 150.0–400.0)
RBC: 4.68 Mil/uL (ref 3.87–5.11)
RDW: 13 % (ref 11.5–15.5)
WBC: 5.5 10*3/uL (ref 4.0–10.5)

## 2021-03-28 LAB — TSH: TSH: 3.39 u[IU]/mL (ref 0.35–5.50)

## 2021-03-28 MED ORDER — POTASSIUM CHLORIDE ER 10 MEQ PO TBCR: 10.0000 meq | EXTENDED_RELEASE_TABLET | Freq: Every day | ORAL | 3 refills | Status: DC

## 2021-03-28 MED ORDER — NEBIVOLOL HCL 10 MG PO TABS: 10.0000 mg | ORAL_TABLET | Freq: Every day | ORAL | 3 refills | Status: DC

## 2021-03-28 MED ORDER — ALPRAZOLAM 0.25 MG PO TABS: 0.2500 mg | ORAL_TABLET | Freq: Three times a day (TID) | ORAL | 5 refills | Status: DC | PRN

## 2021-03-28 MED ORDER — HYDROCHLOROTHIAZIDE 25 MG PO TABS: 25.0000 mg | ORAL_TABLET | Freq: Every day | ORAL | 3 refills | Status: DC

## 2021-03-28 NOTE — Progress Notes (Signed)
Subjective:    Patient ID: Sherry Chapman, female    DOB: 02/20/1956, 65 y.o.   MRN: [id number]  HPI Here for a well exam. She feels great. She is now retired and she enjoys doing crafts, fishing, and spending time with family. She asks me to check a lesion that appeared on her left lower eyelid about 2 years ago. This does not bother her, but it is very slowly getting larger.    Review of Systems  Constitutional: Negative.   HENT: Negative.    Eyes: Negative.   Respiratory: Negative.    Cardiovascular: Negative.   Gastrointestinal: Negative.   Genitourinary:  Negative for decreased urine volume, difficulty urinating, dyspareunia, dysuria, enuresis, flank pain, frequency, hematuria, pelvic pain and urgency.  Musculoskeletal: Negative.   Skin: Negative.   Neurological: Negative.  Negative for headaches.  Psychiatric/Behavioral: Negative.        Objective:   Physical Exam Constitutional:      General: She is not in acute distress.    Appearance: She is well-developed.  HENT:     Head: Normocephalic and atraumatic.     Right Ear: External ear normal.     Left Ear: External ear normal.     Nose: Nose normal.     Mouth/Throat:     Pharynx: No oropharyngeal exudate.  Eyes:     General: No scleral icterus.    Conjunctiva/sclera: Conjunctivae normal.     Pupils: Pupils are equal, round, and reactive to light.  Neck:     Thyroid: No thyromegaly.     Vascular: No JVD.  Cardiovascular:     Rate and Rhythm: Normal rate and regular rhythm.     Heart sounds: Normal heart sounds. No murmur heard.   No friction rub. No gallop.  Pulmonary:     Effort: Pulmonary effort is normal. No respiratory distress.     Breath sounds: Normal breath sounds. No wheezing or rales.  Chest:     Chest wall: No tenderness.  Abdominal:     General: Bowel sounds are normal. There is no distension.     Palpations: Abdomen is soft. There is no mass.     Tenderness: There is no abdominal tenderness. There  is no guarding or rebound.  Musculoskeletal:        General: No tenderness. Normal range of motion.     Cervical back: Normal range of motion and neck supple.  Lymphadenopathy:     Cervical: No cervical adenopathy.  Skin:    General: Skin is warm and dry.     Findings: No erythema or rash.     Comments: There is a 3 mm papular tan lesion on the the left lower eyelid along the lash line   Neurological:     Mental Status: She is alert and oriented to person, place, and time.     Cranial Nerves: No cranial nerve deficit.     Motor: No abnormal muscle tone.     Coordination: Coordination normal.     Deep Tendon Reflexes: Reflexes are normal and symmetric. Reflexes normal.  Psychiatric:        Behavior: Behavior normal.        Thought Content: Thought content normal.        Judgment: Judgment normal.          Assessment & Plan:  Well exam. We discussed diet and exercise. Get fasting labs. We will refer her to Ophthalmology to evaluate the eyelid lesion.  Alysia Penna, MD

## 2021-03-29 ENCOUNTER — Telehealth: Payer: Self-pay | Admitting: Family Medicine

## 2021-03-29 LAB — HEPATIC FUNCTION PANEL
ALT: 17 U/L (ref 0–35)
AST: 15 U/L (ref 0–37)
Albumin: 4.3 g/dL (ref 3.5–5.2)
Alkaline Phosphatase: 60 U/L (ref 39–117)
Bilirubin, Direct: 0.1 mg/dL (ref 0.0–0.3)
Total Bilirubin: 0.4 mg/dL (ref 0.2–1.2)
Total Protein: 7.3 g/dL (ref 6.0–8.3)

## 2021-03-29 LAB — BASIC METABOLIC PANEL
BUN: 13 mg/dL (ref 6–23)
CO2: 28 mEq/L (ref 19–32)
Calcium: 9.2 mg/dL (ref 8.4–10.5)
Chloride: 101 mEq/L (ref 96–112)
Creatinine, Ser: 0.94 mg/dL (ref 0.40–1.20)
GFR: 64.02 mL/min (ref >60.00)
Glucose, Bld: 109 mg/dL — ABNORMAL HIGH (ref 70–99)
Potassium: 3.8 mEq/L (ref 3.5–5.1)
Sodium: 138 mEq/L (ref 135–145)

## 2021-03-29 LAB — LIPID PANEL
Cholesterol: 186 mg/dL (ref 0–200)
HDL: 50.8 mg/dL (ref >39.00)
LDL Cholesterol: 109 mg/dL — ABNORMAL HIGH (ref 0–99)
NonHDL: 135.18
Total CHOL/HDL Ratio: 4
Triglycerides: 131 mg/dL (ref 0.0–149.0)
VLDL: 26.2 mg/dL (ref 0.0–40.0)

## 2021-03-29 LAB — HEMOGLOBIN A1C: Hgb A1c MFr Bld: 6.3 % (ref 4.6–6.5)

## 2021-03-29 NOTE — Telephone Encounter (Signed)
Caryl Pina from [facility] called stating that they are out of network with the patient's plan and that the patient has requested to cancel their appointment. Patient is requesting a provider that is in network with her plan.  Caryl Pina from [facility] contact information is [phone].

## 2021-04-06 ENCOUNTER — Telehealth: Payer: Self-pay | Admitting: Family Medicine

## 2021-04-06 NOTE — Telephone Encounter (Signed)
Pt is calling to let dr fry the ophthalmologist he referred her to is not in her network however dr Shanon Rosser is in her network their phone number is [phone] referred was for lesion on left lower eyelid

## 2021-04-06 NOTE — Telephone Encounter (Signed)
[facility]  

## 2021-04-07 ENCOUNTER — Other Ambulatory Visit: Payer: Self-pay

## 2021-04-07 MED ORDER — MOMETASONE FURO-FORMOTEROL FUM 100-5 MCG/ACT IN AERO: 2.0000 | INHALATION_SPRAY | Freq: Every day | RESPIRATORY_TRACT | 11 refills | Status: AC

## 2021-04-07 NOTE — Telephone Encounter (Signed)
Patient stated earlier that pharmacy will contact when prescription ready for pickup.

## 2021-04-07 NOTE — Telephone Encounter (Signed)
I sent in the [facility]

## 2021-04-07 NOTE — Telephone Encounter (Signed)
Spoke with patient about referral being placed.     Patient requested refill on [facility] inhaler.   Last refill on inhaler was 2016,  is okay for refill?

## 2021-04-07 NOTE — Addendum Note (Signed)
Addended by: Alysia Penna A on: 04/07/2021 07:48 AM   Modules accepted: Orders

## 2021-04-07 NOTE — Telephone Encounter (Signed)
I changed the referral to Dr. Katy Fitch

## 2021-05-13 ENCOUNTER — Telehealth: Payer: Self-pay | Admitting: Family Medicine

## 2021-05-13 ENCOUNTER — Ambulatory Visit (INDEPENDENT_AMBULATORY_CARE_PROVIDER_SITE_OTHER): Payer: 59 | Admitting: Family Medicine

## 2021-05-13 ENCOUNTER — Encounter: Payer: Self-pay | Admitting: Family Medicine

## 2021-05-13 VITALS — BP 130/78 | HR 63 | Temp 97.9°F | Wt 195.0 lb

## 2021-05-13 DIAGNOSIS — H43392 Other vitreous opacities, left eye: Secondary | ICD-10-CM

## 2021-05-13 DIAGNOSIS — F418 Other specified anxiety disorders: Secondary | ICD-10-CM

## 2021-05-13 DIAGNOSIS — H43812 Vitreous degeneration, left eye: Secondary | ICD-10-CM

## 2021-05-13 MED ORDER — HYDROXYZINE PAMOATE 25 MG PO CAPS: 25.0000 mg | ORAL_CAPSULE | Freq: Four times a day (QID) | ORAL | 2 refills | Status: DC | PRN

## 2021-05-13 NOTE — Telephone Encounter (Signed)
Pt seen dr fry today for anxiety and was prescribed hydrOXYzine (VISTARIL) 25 MG capsule and per pt that medication can cause retina damage  . Please advise

## 2021-05-13 NOTE — Telephone Encounter (Signed)
I did the referral 

## 2021-05-13 NOTE — Progress Notes (Signed)
° °  Subjective:    Patient ID: Sherry Chapman, female    DOB: December 08, 1955, 66 y.o.   MRN: [id number]  HPI Here for several issues. First she asks for help with anxiety. She has seen Dr. Katy Fitch recently to remove a lesion from the left eye, and this surgery has healed well. However she also has floaters in the left visual field and she was diagnosed with a vitreous detachment in the left eye. This process has greatly worsened her anxiety, and she now feels shaky and she worries about things. She has trouble sleeping, an d she cannot relax. She has Xanax at home to use, but she seldom sues this because she is afraid of becoming addicted to it. She says her family has a hx of addiction problems, and she does not want to end up like them. She has tried Prozac, Lexapro, Celexa, Zoloft, and Wellbutrin in the past, but all these gave her side effects. She is also interested in seeing a therapist. The other issue is the retinal problems mentioned above. She now needs a referral to a retina specialist.    Review of Systems  Constitutional: Negative.   Eyes:  Positive for visual disturbance.  Respiratory: Negative.    Cardiovascular: Negative.   Psychiatric/Behavioral:  Positive for decreased concentration and sleep disturbance. Negative for agitation, behavioral problems, confusion, dysphoric mood and hallucinations. The patient is nervous/anxious.       Objective:   Physical Exam Constitutional:      Appearance: Normal appearance. She is not ill-appearing.  Cardiovascular:     Rate and Rhythm: Normal rate and regular rhythm.     Pulses: Normal pulses.     Heart sounds: Normal heart sounds.  Pulmonary:     Effort: Pulmonary effort is normal.     Breath sounds: Normal breath sounds.  Neurological:     Mental Status: She is alert.  Psychiatric:        Behavior: Behavior normal.        Thought Content: Thought content normal.     Comments: She is anxious          Assessment & Plan:  For the  anxiety, she will try Hydroxyzine 25 mg every 6 hours as needed. I reassured her this is safe and has no addiction potential. I also gave her information to contact [facility] to find a therapist to talk to. We will also refer her to a retina specialist. We spent a total of ( 32  ) minutes reviewing records and discussing these issues.   Alysia Penna, MD

## 2021-05-13 NOTE — Telephone Encounter (Signed)
Please advise 

## 2021-05-13 NOTE — Telephone Encounter (Signed)
Previous messages has been sent

## 2021-05-13 NOTE — Telephone Encounter (Signed)
Pt was seen today and per pt dr Lanny Hurst [facility] is not accepting new pt and if dr fry put a referral for dr [facility] they would make an appt for the patient . Pt does not want to see any provider she would like to see who dr fry recommended with is Dr Lanny Hurst [facility]

## 2021-05-13 NOTE — Telephone Encounter (Signed)
I understand. STOP the Hydroxyzine. Instead she can try Remeron 15 mg at bedtime. Call in #30 with 2 rf  (this is not habit forming)

## 2021-05-13 NOTE — Telephone Encounter (Signed)
Patient called in stating that she rather go with Dr. Tempie Hoist instead of Dr. Deloria Lair. She stated that her insurances accepts both.  Please advise

## 2021-05-16 ENCOUNTER — Other Ambulatory Visit: Payer: Self-pay

## 2021-05-16 MED ORDER — MIRTAZAPINE 15 MG PO TABS: 15.0000 mg | ORAL_TABLET | Freq: Every day | ORAL | 2 refills | Status: DC

## 2021-05-16 NOTE — Telephone Encounter (Signed)
Pt call back and stated this is dr.John Rodman [facility] fax # [phone] because it need to be fax to them.

## 2021-05-16 NOTE — Telephone Encounter (Signed)
Called patient message below given.    Discontinued Hydroxyzine, Remeron 15mg  sent to [facility] on [address].

## 2021-05-16 NOTE — Telephone Encounter (Signed)
Contacted covering referral coordinator S. Cannady with information, will fax to requested office.

## 2021-05-16 NOTE — Telephone Encounter (Signed)
Spoke with patient.  Aware referral has been placed

## 2021-05-16 NOTE — Telephone Encounter (Signed)
If it is not too late, she said she prefers to see Dr. Tempie Hoist, thanks

## 2021-05-17 NOTE — Telephone Encounter (Signed)
Spoke with April with [facility] aware that pt medical records was faxed at 10.30 am

## 2021-05-17 NOTE — Telephone Encounter (Signed)
April from [facility] called because they have still not received the records needed for patient referral. April stated they were supposed to be faxed yesterday but they didn't receive anything. Please send to fax number [phone]    Please advise

## 2021-05-19 NOTE — Progress Notes (Shared)
[facility] Note  05/25/2021     CHIEF COMPLAINT Patient presents for No chief complaint on file.   HISTORY OF PRESENT ILLNESS: Sherry Chapman is a 66 y.o. female who presents to the clinic today for:     Referring physician: Laurey Morale, MD [place],  [place] [address]  HISTORICAL INFORMATION:   Selected notes from the MEDICAL RECORD NUMBER Referred by Dr. Truman Hayward:  Ocular Hx- PMH-    CURRENT MEDICATIONS: No current outpatient medications on file. (Ophthalmic Drugs)   No current facility-administered medications for this visit. (Ophthalmic Drugs)   Current Outpatient Medications (Other)  Medication Sig   mirtazapine (REMERON) 15 MG tablet Take 1 tablet (15 mg total) by mouth at bedtime.   acetaminophen (TYLENOL) 325 MG tablet Take 325 mg by mouth every 6 (six) hours as needed.    amLODipine (NORVASC) 5 MG tablet TAKE 1 TABLET BY MOUTH EVERY DAY   Famotidine (PEPCID AC PO) Take by mouth.   hydrochlorothiazide (HYDRODIURIL) 25 MG tablet Take 1 tablet (25 mg total) by mouth daily.   lactase (LACTAID) 3000 units tablet Take by mouth 3 (three) times daily with meals.   mometasone-formoterol (DULERA) 100-5 MCG/ACT AERO Inhale 2 puffs into the lungs daily.   Multiple Vitamin (MULTIVITAMIN) tablet Take 1 tablet by mouth daily.   nebivolol (BYSTOLIC) 10 MG tablet Take 1 tablet (10 mg total) by mouth daily.   potassium chloride (KLOR-CON) 10 MEQ tablet Take 1 tablet (10 mEq total) by mouth daily.   No current facility-administered medications for this visit. (Other)      REVIEW OF SYSTEMS:    ALLERGIES Allergies  Allergen Reactions   Prednisone Anxiety    Insomnia, Hypersensitivity   Clarithromycin     Biaxin - REACTION: Nausea   Clindamycin/Lincomycin Diarrhea   Inderal [Propranolol]     Chronic bronchitis    Levofloxacin     REACTION: diarrhea????    PAST MEDICAL HISTORY Past Medical History:  Diagnosis Date    Allergy    Anemia    past hx    Anxiety    Arthritis    neck and upper back    Back pain    Benign neoplasm of adrenal gland    Depression    Diverticulosis    01-15-2008 colon and 01/2018 colon   GERD (gastroesophageal reflux disease)    Hematuria, unspecified    Hx of colonic polyps 2019   Hyperlipidemia    Hypertension    Lactose intolerance    uses lactaid prn    Neuromuscular disorder (HCC)    RLS    Restless leg syndrome    Ulnar abutment syndrome    bilaterally    Vertigo    Past Surgical History:  Procedure Laterality Date   COLONOSCOPY  03/01/2018   per Dr. Havery Moros, sessile serrated polyp, repeat in 5 yrs    ganglion cyst removed Left    06-05-2008 Dr Graylon Good- left wrist    NASAL SINUS SURGERY     nasal reconstruction   toe surgery      FAMILY HISTORY Family History  Problem Relation Age of Onset   Heart disease Mother    Hypertension Mother    Diabetes Mother    Alcohol abuse Father    Coronary artery disease Father    Hypertension Father    Hypertension Sister    Diabetes Sister    Hypertension Brother    Diabetes Brother  Heart disease Brother    Aneurysm Brother    Colon cancer Neg Hx    Colon polyps Neg Hx    Esophageal cancer Neg Hx    Rectal cancer Neg Hx    Stomach cancer Neg Hx     SOCIAL HISTORY Social History   Tobacco Use   Smoking status: Never   Smokeless tobacco: Never  Vaping Use   Vaping Use: Never used  Substance Use Topics   Alcohol use: No    Alcohol/week: 0.0 standard drinks   Drug use: No         OPHTHALMIC EXAM:  Not recorded     IMAGING AND PROCEDURES  Imaging and Procedures for 05/25/2021           ASSESSMENT/PLAN:  No diagnosis found.  1.  2.  3.  Ophthalmic Meds Ordered this visit:  No orders of the defined types were placed in this encounter.      No follow-ups on file.  There are no Patient Instructions on file for this visit.   Explained the diagnoses, plan, and  follow up with the patient and they expressed understanding.  Patient expressed understanding of the importance of proper follow up care.   Gardiner Sleeper, M.D., Ph.D. Diseases & Surgery of the Retina and [place] @TODAY @     Abbreviations: M myopia (nearsighted); A astigmatism; H hyperopia (farsighted); P presbyopia; Mrx spectacle prescription;  CTL contact lenses; OD right eye; OS left eye; OU both eyes  XT exotropia; ET esotropia; PEK punctate epithelial keratitis; PEE punctate epithelial erosions; DES dry eye syndrome; MGD meibomian gland dysfunction; ATs artificial tears; PFAT's preservative free artificial tears; [place] nuclear sclerotic cataract; PSC posterior subcapsular cataract; ERM epi-retinal membrane; PVD posterior vitreous detachment; RD retinal detachment; DM diabetes mellitus; DR diabetic retinopathy; NPDR non-proliferative diabetic retinopathy; PDR proliferative diabetic retinopathy; CSME clinically significant macular edema; DME diabetic macular edema; dbh dot blot hemorrhages; CWS cotton wool spot; POAG primary open angle glaucoma; C/D cup-to-disc ratio; HVF humphrey visual field; GVF goldmann visual field; OCT optical coherence tomography; IOP intraocular pressure; BRVO Branch retinal vein occlusion; CRVO central retinal vein occlusion; CRAO central retinal artery occlusion; BRAO branch retinal artery occlusion; RT retinal tear; SB scleral buckle; PPV pars plana vitrectomy; VH Vitreous hemorrhage; PRP panretinal laser photocoagulation; IVK intravitreal kenalog; VMT vitreomacular traction; MH Macular hole;  NVD neovascularization of the disc; NVE neovascularization elsewhere; AREDS age related eye disease study; ARMD age related macular degeneration; POAG primary open angle glaucoma; EBMD epithelial/anterior basement membrane dystrophy; ACIOL anterior chamber intraocular lens; IOL intraocular lens; PCIOL posterior chamber intraocular lens; Phaco/IOL  phacoemulsification with intraocular lens placement; [place] photorefractive keratectomy; LASIK laser assisted in situ keratomileusis; HTN hypertension; DM diabetes mellitus; COPD chronic obstructive pulmonary disease

## 2021-05-25 ENCOUNTER — Encounter (INDEPENDENT_AMBULATORY_CARE_PROVIDER_SITE_OTHER): Payer: 59 | Admitting: Ophthalmology

## 2021-05-25 DIAGNOSIS — H3581 Retinal edema: Secondary | ICD-10-CM

## 2021-06-06 NOTE — Progress Notes (Signed)
[facility] Note  06/09/2021     CHIEF COMPLAINT Patient presents for Retina Evaluation   HISTORY OF PRESENT ILLNESS: Sherry Chapman is a 66 y.o. female who presents to the clinic today for:   HPI     Retina Evaluation   In left eye.  This started 5 weeks ago.  Associated Symptoms Floaters.  I, the attending physician,  performed the HPI with the patient and updated documentation appropriately.        Comments   5 weeks ago she had a lesion removed from LLL by Dr. Katy Fitch. 1 week later at her follow up Dr. Katy Fitch noticed vitreous detachment.  She is now seeing floaters that just started OS.  Denies FOLs or decrease in vision. She has seen Dr. Sabra Heck as well.  He agrees her floaters are okay. She states she has anxiety and is worried about this.  She discussed this with Dr. Sarajane Jews who recommended she see Dr. Coralyn Pear to help ease her mind.  She also has dry eyes.  She has decreased (stopped) her HCTZ because she thinks it makes her eyes dry.        Last edited by Bernarda Caffey, MD on 06/10/2021  2:22 AM.    Pt is here on the referral of Dr. Sarajane Jews for concern of PVD, pt is also a pt of Dr. Katy Fitch, she recently had a lesion removed from her LLL 5 weeks ago, she states they sent it to pathology and it came back okay, she states that day when she left his office, she had a lot of new floaters, pt states her eye felt gritty afterwards and Dr. Sabra Heck seemed to think it was dry eyes, pt denies being diabetic, but she endorses having  Referring physician: Laurey Morale, MD [place],   [id number]  HISTORICAL INFORMATION:   Selected notes from the MEDICAL RECORD NUMBER Referred by Dr. Sarajane Jews for concern of PVD / floaters OS LEE:  Ocular Hx- PMH-    CURRENT MEDICATIONS: No current outpatient medications on file. (Ophthalmic Drugs)   No current facility-administered medications for this visit. (Ophthalmic Drugs)   Current Outpatient Medications  (Other)  Medication Sig   acetaminophen (TYLENOL) 325 MG tablet Take 325 mg by mouth every 6 (six) hours as needed.    amLODipine (NORVASC) 5 MG tablet TAKE 1 TABLET BY MOUTH EVERY DAY   Famotidine (PEPCID AC PO) Take by mouth.   lactase (LACTAID) 3000 units tablet Take by mouth 3 (three) times daily with meals.   mirtazapine (REMERON) 15 MG tablet Take 1 tablet (15 mg total) by mouth at bedtime.   mometasone-formoterol (DULERA) 100-5 MCG/ACT AERO Inhale 2 puffs into the lungs daily.   Multiple Vitamin (MULTIVITAMIN) tablet Take 1 tablet by mouth daily.   nebivolol (BYSTOLIC) 10 MG tablet Take 1 tablet (10 mg total) by mouth daily.   potassium chloride (KLOR-CON) 10 MEQ tablet Take 1 tablet (10 mEq total) by mouth daily.   hydrochlorothiazide (HYDRODIURIL) 25 MG tablet Take 1 tablet (25 mg total) by mouth daily. (Patient not taking: Reported on 06/09/2021)   No current facility-administered medications for this visit. (Other)   REVIEW OF SYSTEMS: ROS   Positive for: Gastrointestinal, Musculoskeletal, Eyes, Psychiatric Negative for: Constitutional, Neurological, Skin, Genitourinary, HENT, Endocrine, Cardiovascular, Respiratory, Allergic/Imm, Heme/Lymph Last edited by Leonie Douglas, COA on 06/09/2021  9:08 AM.     ALLERGIES Allergies  Allergen Reactions   Prednisone Anxiety  Insomnia, Hypersensitivity   Clarithromycin     Biaxin - REACTION: Nausea   Clindamycin/Lincomycin Diarrhea   Inderal [Propranolol]     Chronic bronchitis    Levofloxacin     REACTION: diarrhea????    PAST MEDICAL HISTORY Past Medical History:  Diagnosis Date   Allergy    Anemia    past hx    Anxiety    Arthritis    neck and upper back    Back pain    Benign neoplasm of adrenal gland    Depression    Diverticulosis    01-15-2008 colon and 01/2018 colon   GERD (gastroesophageal reflux disease)    Hematuria, unspecified    Hx of colonic polyps 2019   Hyperlipidemia    Hypertension    Lactose  intolerance    uses lactaid prn    Neuromuscular disorder (HCC)    RLS    Restless leg syndrome    Ulnar abutment syndrome    bilaterally    Vertigo    Past Surgical History:  Procedure Laterality Date   COLONOSCOPY  03/01/2018   per Dr. Havery Moros, sessile serrated polyp, repeat in 5 yrs    ganglion cyst removed Left    06-05-2008 Dr Graylon Good- left wrist    NASAL SINUS SURGERY     nasal reconstruction   toe surgery     FAMILY HISTORY Family History  Problem Relation Age of Onset   Heart disease Mother    Hypertension Mother    Diabetes Mother    Alcohol abuse Father    Coronary artery disease Father    Hypertension Father    Hypertension Sister    Diabetes Sister    Hypertension Brother    Diabetes Brother    Heart disease Brother    Aneurysm Brother    Colon cancer Neg Hx    Colon polyps Neg Hx    Esophageal cancer Neg Hx    Rectal cancer Neg Hx    Stomach cancer Neg Hx    SOCIAL HISTORY Social History   Tobacco Use   Smoking status: Never   Smokeless tobacco: Never  Vaping Use   Vaping Use: Never used  Substance Use Topics   Alcohol use: No    Alcohol/week: 0.0 standard drinks   Drug use: No       OPHTHALMIC EXAM:  Base Eye Exam     Visual Acuity (Snellen - Linear)       Right Left   Dist cc 20/25 20/20   Dist ph cc NI     Correction: Glasses         Tonometry (Tonopen, 9:19 AM)       Right Left   Pressure 17 18         Pupils       Dark Light Shape React APD   Right 3 2 Round Brisk None   Left 3 2 Round Brisk None         Visual Fields (Counting fingers)       Left Right    Full Full         Extraocular Movement       Right Left    Full Full         Neuro/Psych     Oriented x3: Yes   Mood/Affect: Normal         Dilation     Both eyes: 1.0% Mydriacyl, 2.5% Phenylephrine @ 9:19 AM  Slit Lamp and Fundus Exam     Slit Lamp Exam       Right Left   Lids/Lashes Dermatochalasis - upper lid  Dermatochalasis - upper lid   Conjunctiva/Sclera White and quiet White and quiet   Cornea trace PEE trace PEE   Anterior Chamber Deep and quiet Deep and quiet   Iris Round and dilated Round and dilated   Lens 1-2+ Nuclear sclerosis, 2+ Cortical cataract 1-2+ Nuclear sclerosis, 1-2+ Cortical cataract   Anterior Vitreous Vitreous syneresis Vitreous syneresis, Posterior vitreous detachment, Weiss ring, vitreous condensations         Fundus Exam       Right Left   Disc Pink and Sharp, tilted, 360 PPA Pink and Sharp, Compact, tilted, 360 PPA   C/D Ratio 0.4 0.4   Macula Flat, Blunted foveal reflex, RPE mottling and clumping, No heme or edema Flat, Blunted foveal reflex, RPE mottling and clumping, No heme or edema   Vessels mild attenuation, mild tortuosity mild attenuation, mild tortuosity   Periphery Attached, blonde fundus, punctate flame heme just outside IT arcades, mild pigmented cystoid degeneration inferiorly Attached, blonde fundus, punctate flame heme just outside IT arcades, mild pigmented cystoid degeneration inferiorly           Refraction     Wearing Rx       Sphere Cylinder Axis Add   Right -6.00 +1.50 150 +2.50   Left -5.00 Sphere  +2.50         Manifest Refraction       Sphere Cylinder Axis Dist VA   Right -6.25 +1.00 150 20/20   Left               IMAGING AND PROCEDURES  Imaging and Procedures for 06/09/2021  OCT, Retina - OU - Both Eyes       Right Eye Quality was good. Central Foveal Thickness: 256. Progression has no prior data. Findings include normal foveal contour, no IRF, no SRF, myopic contour.   Left Eye Quality was good. Central Foveal Thickness: 256. Progression has no prior data. Findings include normal foveal contour, no IRF, no SRF, myopic contour.   Notes *Images captured and stored on drive  Diagnosis / Impression:  NFP; no IRF/SRF OU  Clinical management:  See below  Abbreviations: NFP - Normal foveal profile. CME -  cystoid macular edema. PED - pigment epithelial detachment. IRF - intraretinal fluid. SRF - subretinal fluid. EZ - ellipsoid zone. ERM - epiretinal membrane. ORA - outer retinal atrophy. ORT - outer retinal tubulation. SRHM - subretinal hyper-reflective material. IRHM - intraretinal hyper-reflective material            ASSESSMENT/PLAN:    ICD-10-CM   1. Posterior vitreous detachment of left eye  H43.812     2. Essential hypertension  I10     3. Hypertensive retinopathy of both eyes  H35.033 OCT, Retina - OU - Both Eyes    4. Combined forms of age-related cataract of both eyes  H25.813      1. PVD / vitreous syneresis OU  - acute floaters OS onset early Feb -- evaluated at [facility] and dx w/ PVD  - Pt here for second opinion re: PVD w/ persistent floaters - Discussed findings and prognosis  - No RT or RD on 360 scleral depressed exam  - Reviewed s/s of RT/RD  - Strict return precautions for any such RT/RD signs/symptoms  - pt is cleared from a retina standpoint for release  to Drs. Sabra Heck and Groat and resumption of primary eye care  - pt can f/u here PRN  2,3. Hypertensive retinopathy OU - discussed importance of tight BP control - monitor  4. Mixed Cataract OU - The symptoms of cataract, surgical options, and treatments and risks were discussed with patient. - discussed diagnosis and progression - not yet visually significant - monitor for now   Ophthalmic Meds Ordered this visit:  No orders of the defined types were placed in this encounter.    Return if symptoms worsen or fail to improve.  There are no Patient Instructions on file for this visit.   Explained the diagnoses, plan, and follow up with the patient and they expressed understanding.  Patient expressed understanding of the importance of proper follow up care.   This document serves as a record of services personally performed by Gardiner Sleeper, MD, PhD. It was created on their behalf by San Jetty.  Owens Shark, OA an ophthalmic technician. The creation of this record is the provider's dictation and/or activities during the visit.    Electronically signed by: San Jetty. Owens Shark, [place] 02.27.2023 2:29 AM   Gardiner Sleeper, M.D., Ph.D. Diseases & Surgery of the Retina and Vitreous [facility]  I have reviewed the above documentation for accuracy and completeness, and I agree with the above. Gardiner Sleeper, M.D., Ph.D. 06/10/21 2:29 AM   Abbreviations: M myopia (nearsighted); A astigmatism; H hyperopia (farsighted); P presbyopia; Mrx spectacle prescription;  CTL contact lenses; OD right eye; OS left eye; OU both eyes  XT exotropia; ET esotropia; PEK punctate epithelial keratitis; PEE punctate epithelial erosions; DES dry eye syndrome; MGD meibomian gland dysfunction; ATs artificial tears; PFAT's preservative free artificial tears; [place] nuclear sclerotic cataract; PSC posterior subcapsular cataract; ERM epi-retinal membrane; PVD posterior vitreous detachment; RD retinal detachment; DM diabetes mellitus; DR diabetic retinopathy; NPDR non-proliferative diabetic retinopathy; PDR proliferative diabetic retinopathy; CSME clinically significant macular edema; DME diabetic macular edema; dbh dot blot hemorrhages; CWS cotton wool spot; POAG primary open angle glaucoma; C/D cup-to-disc ratio; HVF humphrey visual field; GVF goldmann visual field; OCT optical coherence tomography; IOP intraocular pressure; BRVO Branch retinal vein occlusion; CRVO central retinal vein occlusion; CRAO central retinal artery occlusion; BRAO branch retinal artery occlusion; RT retinal tear; SB scleral buckle; PPV pars plana vitrectomy; VH Vitreous hemorrhage; PRP panretinal laser photocoagulation; IVK intravitreal kenalog; VMT vitreomacular traction; MH Macular hole;  NVD neovascularization of the disc; NVE neovascularization elsewhere; AREDS age related eye disease study; ARMD age related macular degeneration; POAG  primary open angle glaucoma; EBMD epithelial/anterior basement membrane dystrophy; ACIOL anterior chamber intraocular lens; IOL intraocular lens; PCIOL posterior chamber intraocular lens; Phaco/IOL phacoemulsification with intraocular lens placement; [place] photorefractive keratectomy; LASIK laser assisted in situ keratomileusis; HTN hypertension; DM diabetes mellitus; COPD chronic obstructive pulmonary disease

## 2021-06-09 ENCOUNTER — Ambulatory Visit (INDEPENDENT_AMBULATORY_CARE_PROVIDER_SITE_OTHER): Payer: Medicare Other | Admitting: Ophthalmology

## 2021-06-09 ENCOUNTER — Other Ambulatory Visit: Payer: Self-pay

## 2021-06-09 ENCOUNTER — Encounter (INDEPENDENT_AMBULATORY_CARE_PROVIDER_SITE_OTHER): Payer: Self-pay | Admitting: Ophthalmology

## 2021-06-09 DIAGNOSIS — H35033 Hypertensive retinopathy, bilateral: Secondary | ICD-10-CM | POA: Diagnosis not present

## 2021-06-09 DIAGNOSIS — I10 Essential (primary) hypertension: Secondary | ICD-10-CM | POA: Diagnosis not present

## 2021-06-09 DIAGNOSIS — H43812 Vitreous degeneration, left eye: Secondary | ICD-10-CM

## 2021-06-09 DIAGNOSIS — H25813 Combined forms of age-related cataract, bilateral: Secondary | ICD-10-CM | POA: Diagnosis not present

## 2021-06-09 DIAGNOSIS — H3581 Retinal edema: Secondary | ICD-10-CM

## 2021-06-10 ENCOUNTER — Encounter (INDEPENDENT_AMBULATORY_CARE_PROVIDER_SITE_OTHER): Payer: 59 | Admitting: Ophthalmology

## 2021-06-10 ENCOUNTER — Encounter (INDEPENDENT_AMBULATORY_CARE_PROVIDER_SITE_OTHER): Payer: Self-pay | Admitting: Ophthalmology

## 2021-06-12 DIAGNOSIS — J029 Acute pharyngitis, unspecified: Secondary | ICD-10-CM | POA: Diagnosis not present

## 2021-06-14 DIAGNOSIS — J40 Bronchitis, not specified as acute or chronic: Secondary | ICD-10-CM | POA: Diagnosis not present

## 2021-06-15 ENCOUNTER — Other Ambulatory Visit: Payer: Self-pay | Admitting: Family Medicine

## 2021-06-20 ENCOUNTER — Other Ambulatory Visit: Payer: Self-pay

## 2021-06-20 ENCOUNTER — Encounter: Payer: Self-pay | Admitting: Family Medicine

## 2021-06-20 ENCOUNTER — Emergency Department (HOSPITAL_COMMUNITY)
Admission: EM | Admit: 2021-06-20 | Discharge: 2021-06-21 | Disposition: A | Discharge status: Home / Self Care | Payer: Medicare Other | Attending: Emergency Medicine | Admitting: Emergency Medicine

## 2021-06-20 ENCOUNTER — Ambulatory Visit (INDEPENDENT_AMBULATORY_CARE_PROVIDER_SITE_OTHER): Payer: Medicare Other | Admitting: Family Medicine

## 2021-06-20 ENCOUNTER — Telehealth: Payer: Self-pay | Admitting: Family Medicine

## 2021-06-20 ENCOUNTER — Encounter (HOSPITAL_COMMUNITY): Payer: Self-pay

## 2021-06-20 VITALS — BP 130/80 | HR 75 | Temp 97.9°F | Wt 194.2 lb

## 2021-06-20 DIAGNOSIS — R5383 Other fatigue: Secondary | ICD-10-CM

## 2021-06-20 DIAGNOSIS — J020 Streptococcal pharyngitis: Secondary | ICD-10-CM

## 2021-06-20 DIAGNOSIS — F419 Anxiety disorder, unspecified: Secondary | ICD-10-CM | POA: Diagnosis not present

## 2021-06-20 DIAGNOSIS — Z79899 Other long term (current) drug therapy: Secondary | ICD-10-CM | POA: Insufficient documentation

## 2021-06-20 DIAGNOSIS — I10 Essential (primary) hypertension: Secondary | ICD-10-CM | POA: Diagnosis not present

## 2021-06-20 LAB — CBC WITH DIFFERENTIAL/PLATELET
Abs Immature Granulocytes: 0.03 10*3/uL (ref 0.00–0.07)
Basophils Absolute: 0 10*3/uL (ref 0.0–0.1)
Basophils Relative: 0 %
Eosinophils Absolute: 0.1 10*3/uL (ref 0.0–0.5)
Eosinophils Relative: 1 %
HCT: 42.1 % (ref 36.0–46.0)
Hemoglobin: 13.7 g/dL (ref 12.0–15.0)
Immature Granulocytes: 0 %
Lymphocytes Relative: 27 %
Lymphs Abs: 2 10*3/uL (ref 0.7–4.0)
MCH: 29.5 pg (ref 26.0–34.0)
MCHC: 32.5 g/dL (ref 30.0–36.0)
MCV: 90.7 fL (ref 80.0–100.0)
Monocytes Absolute: 0.4 10*3/uL (ref 0.1–1.0)
Monocytes Relative: 6 %
Neutro Abs: 4.9 10*3/uL (ref 1.7–7.7)
Neutrophils Relative %: 66 %
Platelets: 358 10*3/uL (ref 150–400)
RBC: 4.64 MIL/uL (ref 3.87–5.11)
RDW: 12.8 % (ref 11.5–15.5)
WBC: 7.4 10*3/uL (ref 4.0–10.5)
nRBC: 0 % (ref 0.0–0.2)

## 2021-06-20 LAB — COMPREHENSIVE METABOLIC PANEL
ALT: 18 U/L (ref 0–44)
AST: 16 U/L (ref 15–41)
Albumin: 4.3 g/dL (ref 3.5–5.0)
Alkaline Phosphatase: 65 U/L (ref 38–126)
Anion gap: 9 (ref 5–15)
BUN: 13 mg/dL (ref 8–23)
CO2: 24 mmol/L (ref 22–32)
Calcium: 9 mg/dL (ref 8.9–10.3)
Chloride: 104 mmol/L (ref 98–111)
Creatinine, Ser: 0.88 mg/dL (ref 0.44–1.00)
GFR, Estimated: >60 mL/min (ref >60)
Glucose, Bld: 114 mg/dL — ABNORMAL HIGH (ref 70–99)
Potassium: 3.6 mmol/L (ref 3.5–5.1)
Sodium: 137 mmol/L (ref 135–145)
Total Bilirubin: 0.4 mg/dL (ref 0.3–1.2)
Total Protein: 8.1 g/dL (ref 6.5–8.1)

## 2021-06-20 NOTE — Progress Notes (Signed)
? ?  Subjective:  ? ? Patient ID: Sherry Chapman, female    DOB: [birth year], 66 y.o.   MRN: [id number] ? ?HPI ?Her to follow up on a URI infection and she asks to have her B12 level checked. She feels tired all the time and she read that B12 can often be low for people who take long term stomach acid medications (she has been taking Famotidine for years). She developed fever and a bad ST about 2 weeks ago. After the ofrst 2 days, the ST went away and she started coughing. She had a dry cough for about a week., so she went to an urgent care clinic 10 days ago. She was told she had bronchitis and she was given Amoxicillin. Now she feels better but the cough lingers. Of note we also saw her hiusband today with the same symptoms, and he tested positive for strep.  ? ? ?Review of Systems  ?Constitutional:  Positive for fatigue. Negative for fever.  ?HENT: Negative.    ?Eyes: Negative.   ?Respiratory:  Positive for cough. Negative for shortness of breath and wheezing.   ?Cardiovascular: Negative.   ?Gastrointestinal: Negative.   ? ?   ?Objective:  ? Physical Exam ?Constitutional:   ?   General: She is not in acute distress. ?   Appearance: Normal appearance.  ?HENT:  ?   Right Ear: Tympanic membrane, ear canal and external ear normal.  ?   Left Ear: Tympanic membrane, ear canal and external ear normal.  ?   Nose: Nose normal.  ?   Mouth/Throat:  ?   Pharynx: Oropharynx is clear.  ?Eyes:  ?   Conjunctiva/sclera: Conjunctivae normal.  ?Cardiovascular:  ?   Rate and Rhythm: Normal rate and regular rhythm.  ?   Pulses: Normal pulses.  ?   Heart sounds: Normal heart sounds.  ?Pulmonary:  ?   Effort: Pulmonary effort is normal.  ?   Breath sounds: Normal breath sounds.  ?Lymphadenopathy:  ?   Cervical: No cervical adenopathy.  ?Neurological:  ?   Mental Status: She is alert.  ? ? ? ? ? ?   ?Assessment & Plan:  ?She likely had a strep infection, and she seems to be getting over it. She will follow up as needed. We will check a B12  level, although I told her we do not generally see effects on B12 with H2 blockers, as opposed to PPI's. We spent a total of ( 30  ) minutes reviewing records and discussing these issues.  ? ?Alysia Penna, MD ? ? ?

## 2021-06-20 NOTE — Telephone Encounter (Signed)
Sherry Chapman was seen 3/13 and forgot to ask Dr. Sarajane Jews if she still needs to take her Potassium and Hydrochlorothiazide because she hasn't taken it for a while and her BP is fine. ?

## 2021-06-20 NOTE — ED Triage Notes (Signed)
Pt states that her blood pressure at her doctors office was 130/80, then she went home and it was 200/100. Pt is concerned that she is having HTN and wants to be evaluated.  ?

## 2021-06-20 NOTE — ED Provider Triage Note (Signed)
Emergency Medicine Provider Triage Evaluation Note ? ?Sherry Chapman , a 66 y.o. female  was evaluated in triage.  Pt complains of elevated blood pressure x today. Seen by PCP and forgot to ask if she should discontinue blood pressure medication. Had her BP taken by EMS, they had different readings.  Patient reports after having a blood pressure of 130/80 at PCPs office, she went out and bought a blood pressure reading, subsequently pressure has been 150/90, 170/80.  She also reports calling EMS to check her blood pressure, this was "elevated ".  She is currently not taking her HCTZ as her blood pressure has been controlled on 2 regimens.  Patient is concerned as she does have a prior history of retinal detachment, is concerned for her vision although is here with no headache.  Patient also endorses a history of anxiety, states "I think my blood pressure is worsening this ".  Patient is without any chest pain, shortness of breath, headache. ? ?Review of Systems  ?Positive: NA ?Negative: Chest pain, shortness of breath ? ?Physical Exam  ?BP (!) 174/88 (BP Location: Left Arm)   Pulse 75   Temp 97.8 ?F (36.6 ?C) (Oral)   Resp 15   Ht 5' 5.5" (1.664 m)   Wt 88.9 kg   SpO2 97%   BMI 32.12 kg/m?  ?Gen:   Awake, no distress   ?Resp:  Normal effort  ?MSK:   Moves extremities without difficulty  ?Other:   ? ?Medical Decision Making  ?Medically screening exam initiated at 10:50 PM.  Appropriate orders placed.  Sherry Chapman was informed that the remainder of the evaluation will be completed by another provider, this initial triage assessment does not replace that evaluation, and the importance of remaining in the ED until their evaluation is complete. ? ? ?  ?Janeece Fitting, PA-C ?06/20/21 2300 ? ?

## 2021-06-21 ENCOUNTER — Telehealth: Payer: Self-pay

## 2021-06-21 LAB — VITAMIN B12: Vitamin B-12: 372 pg/mL (ref 211–911)

## 2021-06-21 NOTE — ED Provider Notes (Signed)
?[facility] ?Provider Note ? ? ?CSN: [id number] ?Arrival date & time: 06/20/21  2130 ? ?  ? ?History ? ?Chief Complaint  ?Patient presents with  ? Hypertension  ? ? ?Sherry Chapman is a 66 y.o. female. ? ?The history is provided by the patient.  ?Hypertension ?She has history of hypertension, hyperlipidemia and comes in because she is concerned about her blood pressure.  She had been seen at her primary care provider today and blood pressure was normal (130/80).  However, she had seen a retina specialist recently who had seen evidence of high blood pressure on her funduscopic exam.  She was very concerned, so she got a blood pressure machine and checked her blood pressure at home and it was 200/100.  She called EMS and blood pressure was checked and was also elevated.  She denies headache, tinnitus, epistaxis, chest pain, dyspnea.  She has been compliant with her medications although she she is no longer taking hydrochlorothiazide.  She is also complaining of some increase in her anxiety.  She states she normally only takes alprazolam 1 or 2 days a month, and has had to take it about 12 times in the last month. ?  ?Home Medications ?Prior to Admission medications   ?Medication Sig Start Date End Date Taking? Authorizing Provider  ?acetaminophen (TYLENOL) 325 MG tablet Take 325 mg by mouth every 6 (six) hours as needed.     [provider]  ?albuterol (VENTOLIN HFA) 108 (90 Base) MCG/ACT inhaler albuterol sulfate HFA 90 mcg/actuation aerosol inhaler ? Inhale 2 puffs every 4 hours by inhalation route as needed.    [provider]  ?amLODipine (NORVASC) 5 MG tablet TAKE 1 TABLET BY MOUTH EVERY DAY 07/14/20   Laurey Morale, MD  ?Famotidine (PEPCID AC PO) Take by mouth.    [provider]  ?hydrochlorothiazide (HYDRODIURIL) 25 MG tablet Take 1 tablet (25 mg total) by mouth daily. 03/28/21   Laurey Morale, MD  ?lactase (LACTAID) 3000 units tablet Take by mouth 3  (three) times daily with meals.    [provider]  ?mirtazapine (REMERON) 15 MG tablet TAKE 1 TABLET BY MOUTH EVERYDAY AT BEDTIME 06/15/21   Laurey Morale, MD  ?mometasone-formoterol [facility]) 100-5 MCG/ACT AERO Inhale 2 puffs into the lungs daily. ?Patient not taking: Reported on 06/20/2021 04/07/21   Laurey Morale, MD  ?Multiple Vitamin (MULTIVITAMIN) tablet Take 1 tablet by mouth daily.    [provider]  ?nebivolol (BYSTOLIC) 10 MG tablet Take 1 tablet (10 mg total) by mouth daily. 03/28/21   Laurey Morale, MD  ?potassium chloride (KLOR-CON) 10 MEQ tablet Take 1 tablet (10 mEq total) by mouth daily. 03/28/21   Laurey Morale, MD  ?   ? ?Allergies    ?Prednisone, Clarithromycin, Clindamycin/lincomycin, Inderal [propranolol], and Levofloxacin   ? ?Review of Systems   ?Review of Systems  ?All other systems reviewed and are negative. ? ?Physical Exam ?Updated Vital Signs ?BP (!) 173/84   Pulse 77   Temp 97.8 ?F (36.6 ?C) (Oral)   Resp 16   Ht 5' 5.5" (1.664 m)   Wt 88.9 kg   SpO2 99%   BMI 32.12 kg/m?  ?Physical Exam ?Vitals and nursing note reviewed.  ?66 year old female, resting comfortably and in no acute distress. Vital signs are significant for elevated blood pressure. Oxygen saturation is 99%, which is normal. ?Head is normocephalic and atraumatic. PERRLA, EOMI. Oropharynx is clear. ?Neck is nontender  and supple without adenopathy or JVD. ?Back is nontender and there is no CVA tenderness. ?Lungs are clear without rales, wheezes, or rhonchi. ?Chest is nontender. ?Heart has regular rate and rhythm with occasional extra beat noted.  No murmur is appreciated. ?Abdomen is soft, flat, nontender. ?Extremities have no cyanosis or edema, full range of motion is present. ?Skin is warm and dry without rash. ?Neurologic: Mental status is normal, cranial nerves are intact, moves all extremities equally. ? ?ED Results / Procedures / Treatments   ?Labs ?(all labs ordered are listed, but only  abnormal results are displayed) ?Labs Reviewed  ?COMPREHENSIVE METABOLIC PANEL - Abnormal; Notable for the following components:  ?    Result Value  ? Glucose, Bld 114 (*)   ? All other components within normal limits  ?CBC WITH DIFFERENTIAL/PLATELET  ? ?Procedures ?Procedures  ? ? ?Medications Ordered in ED ?Medications - No data to display ? ?ED Course/ Medical Decision Making/ A&P ?  ?                        ?Medical Decision Making ? ?Elevated blood pressure which apparently is fairly labile.  Old records are reviewed, patient has had 3 office visits in the last 4 months, all of which recorded normal blood pressure readings.  I have discussed with the patient the implications of funduscopic evidence of high blood pressure and that it does not represent an acute problem.  I have recommended to the patient that she start checking her blood pressure on a daily basis to give her primary care provider more data points to decide whether she needs any alteration in blood pressure management. ? ? ? ? ? ? ? ?Final Clinical Impression(s) / ED Diagnoses ?Final diagnoses:  ?Elevated blood pressure reading with diagnosis of hypertension  ?Anxiety  ? ? ?Rx / DC Orders ?ED Discharge Orders   ? ? None  ? ?  ? ? ?  ?Delora Fuel, MD ?35/32/99 [phone] ? ?

## 2021-06-21 NOTE — Telephone Encounter (Signed)
Patient informed of the message below.  Message sent to PCP as she requested Dr Sarajane Jews call her. ?

## 2021-06-21 NOTE — Telephone Encounter (Signed)
Tell her to stay OFF these 2 meds and lets watch the BP for awhile  ?

## 2021-06-21 NOTE — Discharge Instructions (Signed)
Please check your blood pressure once a day and keep a record of it.  Take that record with you when you see your primary care provider.  Your primary care provider will decide if you need to make any adjustments to your blood pressure medication regimen. ?

## 2021-06-21 NOTE — Telephone Encounter (Signed)
See ED note from 06/20/21. ?

## 2021-06-21 NOTE — Telephone Encounter (Signed)
Pt called again and was advised that her message was sent to Dr Sarajane Jews for advise, pt will be notified of Dr Sarajane Jews advise ?

## 2021-06-21 NOTE — Telephone Encounter (Signed)
Message sent to PCP for advise ?

## 2021-06-22 ENCOUNTER — Ambulatory Visit (INDEPENDENT_AMBULATORY_CARE_PROVIDER_SITE_OTHER): Payer: Medicare Other | Admitting: Family Medicine

## 2021-06-22 ENCOUNTER — Encounter: Payer: Self-pay | Admitting: Family Medicine

## 2021-06-22 VITALS — BP 138/80 | HR 73 | Temp 98.1°F | Wt 191.0 lb

## 2021-06-22 DIAGNOSIS — I10 Essential (primary) hypertension: Secondary | ICD-10-CM

## 2021-06-22 DIAGNOSIS — F418 Other specified anxiety disorders: Secondary | ICD-10-CM | POA: Diagnosis not present

## 2021-06-22 MED ORDER — NEBIVOLOL HCL 20 MG PO TABS: 20.0000 mg | ORAL_TABLET | Freq: Every morning | ORAL | 3 refills | Status: DC

## 2021-06-22 NOTE — Telephone Encounter (Signed)
Pt walked in to the office at 5 pm yesterday demanding to see Dr Sarajane Jews, Pt advised that Dr Sarajane Jews advised pt to schedule appointment for today since she was not agreeing with what Dr Sarajane Jews was recommending for her BP, scheduled for appointment at 10 am today ?

## 2021-06-22 NOTE — Progress Notes (Signed)
? ?Subjective:  ? ? Patient ID: Sherry Chapman, female    DOB: [birth year], 66 y.o.   MRN: [id number] ? ?HPI ?Here to discuss recent high BP readings and to follow up on an ED visit on 06-20-41. At the ED her exam and labs and EKG were all normal. She was told that her main problem is anxiety and she should follow up with me. Her BP has been well controlled over the past year (with normal readings at her office visits), but at home she has been getting some very high readings of the past week or so. She has gotten readings as high as 200/100, then at other times it is back to normal. She saw Dr. Bernarda Caffey on 06-09-41, and he diagnosed her with a vitreous detachment in the left eye. He also found flame hemorrhages in both eyes consistent with some hypertensive retinopathy. Ever since she has been aware of this, she has been extremely anxious ans she very worried about her BP. She has been dealing with a number of stressful issues at home for several months, and the most recent incident was the passing of her husband's mother yesterday. She has been taking more Xanax lately then she had been, and this helps but only for a few hours. We had sent in a RX for Mirtazapine 15 mg to take at bedtime on 06-15-41, but she has not picked this up yet. Her husband confirms that she has been very stressed at home the last few weeks. Her BP her today was 138/80, but when I checked this again 15 minutes later it was 160/90 in both arms. She has been taking Amlodipine 5 mg and Nebivolol 10 mg at night every day. She stopped the HCTZ some weeks ago because we felt this was contributing to her dry eye syndrome. Through all this she denies any headache, chest pain, SOB, or dizziness.  ? ? ?Review of Systems  ?Constitutional: Negative.   ?Respiratory: Negative.    ?Cardiovascular: Negative.   ?Neurological: Negative.   ?Psychiatric/Behavioral:  Positive for dysphoric mood and sleep disturbance. Negative for agitation, behavioral problems,  confusion, decreased concentration, hallucinations, self-injury and suicidal ideas. The patient is nervous/anxious.   ? ?   ?Objective:  ? Physical Exam ?Constitutional:   ?   General: She is not in acute distress. ?   Appearance: Normal appearance.  ?Cardiovascular:  ?   Rate and Rhythm: Normal rate and regular rhythm.  ?   Pulses: Normal pulses.  ?   Heart sounds: Normal heart sounds.  ?Pulmonary:  ?   Effort: Pulmonary effort is normal.  ?   Breath sounds: Normal breath sounds.  ?Neurological:  ?   General: No focal deficit present.  ?   Mental Status: She is alert and oriented to person, place, and time.  ?Psychiatric:     ?   Behavior: Behavior normal.     ?   Thought Content: Thought content normal.  ?   Comments: She is very anxious   ? ? ? ? ? ?   ?Assessment & Plan:  ?She has been dealing with a lot of anxiety and this has been contributing to her labile BP readings. For the anxiety, I urged her to pick up the Mirtazapine and to start taking this tonight. She can add Xanax as needed. For the HTN, we will increase the Nebivolol to 20 mg a day and she will take this in the mornings. She will follow up with me in 2  weeks. We spent a total of ( 35  ) minutes reviewing records and discussing these issues.  ?Alysia Penna, MD ? ? ?

## 2021-06-28 DIAGNOSIS — H2513 Age-related nuclear cataract, bilateral: Secondary | ICD-10-CM | POA: Diagnosis not present

## 2021-07-01 ENCOUNTER — Ambulatory Visit (INDEPENDENT_AMBULATORY_CARE_PROVIDER_SITE_OTHER): Payer: Medicare Other | Admitting: Family Medicine

## 2021-07-01 ENCOUNTER — Encounter: Payer: Self-pay | Admitting: Family Medicine

## 2021-07-01 VITALS — BP 140/86 | HR 70 | Temp 97.9°F | Wt 191.0 lb

## 2021-07-01 DIAGNOSIS — I10 Essential (primary) hypertension: Secondary | ICD-10-CM | POA: Diagnosis not present

## 2021-07-01 DIAGNOSIS — J3089 Other allergic rhinitis: Secondary | ICD-10-CM | POA: Insufficient documentation

## 2021-07-01 NOTE — Progress Notes (Signed)
? ?  Subjective:  ? ? Patient ID: Sherry Chapman, female    DOB: July 13, 1955, 66 y.o.   MRN: [id number] ? ?HPI ?Here to follow up on BP and an intermittent ST. At her last visit we increased her Nebivelol to 20 mg a day and kept the Amlodipin eat 5 mg a day. Since then her BP as settled down nicely. She averages in the 130s over 80s at home. She has had a mild ST for about 4 weeks now, but she does no feel sick. No fever or sinus drainage or cough.  ? ? ?Review of Systems  ?Constitutional: Negative.   ?HENT:  Positive for sore throat. Negative for congestion, postnasal drip and sinus pressure.   ?Eyes: Negative.   ?Respiratory: Negative.    ?Cardiovascular: Negative.   ? ?   ?Objective:  ? Physical Exam ?Constitutional:   ?   Appearance: Normal appearance. She is not ill-appearing.  ?HENT:  ?   Right Ear: Tympanic membrane, ear canal and external ear normal.  ?   Left Ear: Tympanic membrane, ear canal and external ear normal.  ?   Nose: Nose normal.  ?   Mouth/Throat:  ?   Pharynx: Oropharynx is clear.  ?Eyes:  ?   Conjunctiva/sclera: Conjunctivae normal.  ?Cardiovascular:  ?   Rate and Rhythm: Normal rate and regular rhythm.  ?   Pulses: Normal pulses.  ?   Heart sounds: Normal heart sounds.  ?Pulmonary:  ?   Effort: Pulmonary effort is normal.  ?   Breath sounds: Normal breath sounds.  ?Lymphadenopathy:  ?   Cervical: No cervical adenopathy.  ?Neurological:  ?   Mental Status: She is alert.  ? ? ? ? ? ?   ?Assessment & Plan:  ?Her HTN is now well controlled. Her ST seems to be from allergies. She cannot take antihistamines because they could make her dry eye syndrome worse. She says she will simply put up with it unless it gets worse.  ?Alysia Penna, MD ? ? ?

## 2021-07-06 ENCOUNTER — Ambulatory Visit: Payer: Medicare Other | Admitting: Physician Assistant

## 2021-07-13 ENCOUNTER — Encounter: Payer: Self-pay | Admitting: Family Medicine

## 2021-07-13 DIAGNOSIS — D3501 Benign neoplasm of right adrenal gland: Secondary | ICD-10-CM

## 2021-07-13 NOTE — Telephone Encounter (Signed)
I did the referral 

## 2021-07-15 ENCOUNTER — Emergency Department (HOSPITAL_COMMUNITY)
Admission: EM | Admit: 2021-07-15 | Discharge: 2021-07-15 | Disposition: A | Discharge status: Home / Self Care | Payer: Medicare Other | Attending: Emergency Medicine | Admitting: Emergency Medicine

## 2021-07-15 ENCOUNTER — Encounter (HOSPITAL_COMMUNITY): Payer: Self-pay | Admitting: Emergency Medicine

## 2021-07-15 DIAGNOSIS — R030 Elevated blood-pressure reading, without diagnosis of hypertension: Secondary | ICD-10-CM

## 2021-07-15 DIAGNOSIS — Z79899 Other long term (current) drug therapy: Secondary | ICD-10-CM | POA: Diagnosis not present

## 2021-07-15 DIAGNOSIS — I10 Essential (primary) hypertension: Secondary | ICD-10-CM

## 2021-07-15 NOTE — Discharge Instructions (Addendum)
You were seen in the emergency department for elevated blood pressure. ? ?As we discussed, I am concerned about causing more harm changing your medications than helping it. It is incredibly important you stick to checking your blood pressure at regular intervals at home. I've attached a log to help you do this. The more often you check it, the readings can be all over the place and that can be very frustrating as a patient.  ? ?I am reassured by the fact that you aren't having any symptoms. This tells [place] that while your readings may be on the higher end, they are not affecting your organs. However, managing your blood pressure is very important in the long run. Make an appointment with your primary doctor as soon as possible next week for an ER follow up and medication management. ? 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 ? ?If you develop any symptoms such as chest pain, difficulty breathing, headache, dizziness, or blurry vision, return to the ER.  ?

## 2021-07-15 NOTE — ED Provider Notes (Signed)
?[facility] DEPT ?Provider Note ? ? ?CSN: [id number] ?Arrival date & time: 07/15/21  1010 ? ?  ? ?History ? ?Chief Complaint  ?Patient presents with  ? Hypertension  ? ? ?Sherry Chapman is a 66 y.o. female who presents emergency department for elevated blood pressure.  Patient states that her primary doctor has been changing her medications recently.  She reports elevated blood pressure for several weeks.  She reports that when she is checked it at home, she has seen readings as high as 180/100.  She denies any symptoms. ? ? ?Hypertension ?Pertinent negatives include no chest pain, no headaches and no shortness of breath.  ? ?  ? ?Home Medications ?Prior to Admission medications   ?Medication Sig Start Date End Date Taking? Authorizing Provider  ?acetaminophen (TYLENOL) 325 MG tablet Take 325 mg by mouth every 6 (six) hours as needed.     [provider]  ?albuterol (VENTOLIN HFA) 108 (90 Base) MCG/ACT inhaler albuterol sulfate HFA 90 mcg/actuation aerosol inhaler ? Inhale 2 puffs every 4 hours by inhalation route as needed.    [provider]  ?amLODipine (NORVASC) 5 MG tablet TAKE 1 TABLET BY MOUTH EVERY DAY 07/14/20   Laurey Morale, MD  ?Famotidine (PEPCID AC PO) Take by mouth.    [provider]  ?lactase (LACTAID) 3000 units tablet Take by mouth 3 (three) times daily with meals.    [provider]  ?mometasone-formoterol (DULERA) 100-5 MCG/ACT AERO Inhale 2 puffs into the lungs daily. 04/07/21   Laurey Morale, MD  ?Multiple Vitamin (MULTIVITAMIN) tablet Take 1 tablet by mouth daily.    [provider]  ?Nebivolol HCl 20 MG TABS Take 1 tablet (20 mg total) by mouth in the morning. 06/22/21   Laurey Morale, MD  ?   ? ?Allergies    ?Prednisone, Clarithromycin, Clindamycin/lincomycin, Inderal [propranolol], and Levofloxacin   ? ?Review of Systems   ?Review of Systems  ?Eyes:  Negative for visual disturbance.  ?Respiratory:  Negative for shortness  of breath.   ?Cardiovascular:  Negative for chest pain.  ?Neurological:  Negative for dizziness, syncope, weakness, light-headedness and headaches.  ?Psychiatric/Behavioral:  The patient is nervous/anxious.   ?All other systems reviewed and are negative. ? ?Physical Exam ?Updated Vital Signs ?BP (!) 149/82 (BP Location: Right Arm)   Pulse 90   Temp 98 ?F (36.7 ?C) (Oral)   Resp 18   SpO2 100%  ?Physical Exam ?Vitals and nursing note reviewed.  ?Constitutional:   ?   Appearance: Normal appearance.  ?HENT:  ?   Head: Normocephalic and atraumatic.  ?Eyes:  ?   Conjunctiva/sclera: Conjunctivae normal.  ?Pulmonary:  ?   Effort: Pulmonary effort is normal. No respiratory distress.  ?Skin: ?   General: Skin is warm and dry.  ?Neurological:  ?   General: No focal deficit present.  ?   Mental Status: She is alert.  ?Psychiatric:     ?   Mood and Affect: Mood normal.     ?   Behavior: Behavior normal.  ? ? ?ED Results / Procedures / Treatments   ?Labs ?(all labs ordered are listed, but only abnormal results are displayed) ?Labs Reviewed - No data to display ? ?EKG ?None ? ?Radiology ?No results found. ? ?Procedures ?Procedures  ? ? ?Medications Ordered in ED ?Medications - No data to display ? ?ED Course/ Medical Decision Making/ A&P ?  ?                        ?  Medical Decision Making ? ?This patient is a 66 year old female who presents to the ED for concern of elevated blood pressure.  ? ?Past Medical History / Co-morbidities / Social History: ?Hypertension, hyperlipidemia, benign neoplasm of adrenal gland, anxiety ? ?Physical Exam: ?Physical exam performed. The pertinent findings include: Patient has no symptoms at this time.  First blood pressure in triage was 155/99, repeat blood pressure after my evaluation was 149/82.  Otherwise normal vital signs. As she has no symptoms with relatively normal blood pressure readings, I have very low concern for acute hypertensive urgency/emergency.  ?  ?Disposition: ?After  consideration of the diagnostic results and the patients response to treatment, I feel that patient's not requiring further work-up, admission, or inpatient treatment for her symptoms today.  We discussed that she needs to check her blood pressure at regular intervals while at home.  She had been advised to come here to the emergency department because her doctor's office was closed today.  I discussed extensively with the patient that after reviewing her medical record as well as her previous blood pressure readings, I did not feel comfortable adding additional agents to her blood pressure regimen.  My concern was about the patient's blood pressure dropping too low and her experiencing negative side effects such as syncope.  I encouraged checking her blood pressure at home on a regular schedule rather than every time she thinks about it.  I also explained that she needs to follow-up with her primary doctor discuss long-term management of her blood pressure.  Discussed reasons to return to the emergency department, and the patient is agreeable to the plan. ? ?Portions of this report may have been transcribed using voice recognition software. Every effort was made to ensure accuracy; however, inadvertent computerized transcription errors may be present.  ? ?Final Clinical Impression(s) / ED Diagnoses ?Final diagnoses:  ?Elevated blood pressure reading  ?Primary hypertension  ? ? ?Rx / DC Orders ?ED Discharge Orders   ? ? None  ? ?  ? ? ?  ?Kateri Plummer, PA-C ?07/15/21 1307 ? ?  ?Varney Biles, MD ?07/16/21 1214 ? ?

## 2021-07-15 NOTE — ED Triage Notes (Signed)
Patient reports elevated BP for weeks. States PCP changing medications. Reports feeling anxious with BP 180/100 at home. BP 155/99 in triage. Denies chest pain, SOB, headache.  ?

## 2021-07-19 ENCOUNTER — Telehealth: Payer: Self-pay

## 2021-07-19 NOTE — Telephone Encounter (Signed)
Transition Care Management Follow-up Telephone Call ?Date of discharge and from where: 07/15/21 from [facility] ?How have you been since you were released from the hospital? Denies h/a or chest pain ?Any questions or concerns? No ? ?Items Reviewed: ?Did the pt receive and understand the discharge instructions provided? Yes  ?Medications obtained and verified?  N/a ?Any new allergies since your discharge? No  ?Do you have support at home? Yes  ? ?Home Care and Equipment/Supplies: ?Were home health services ordered? not applicable ? ? ?Follow up appointments reviewed: ? ?PCP Hospital f/u appt confirmed? Yes  Scheduled to see Dr. Sarajane Jews on 07/19/21 @ 2:30. ?[facility] f/u appt confirmed? No   ?Are transportation arrangements needed? No  ?If their condition worsens, is the pt aware to call PCP or go to the Emergency Dept.? Yes ?Was the patient provided with contact information for the PCP's office or ED? Yes ?Was to pt encouraged to call back with questions or concerns? Yes  ?

## 2021-07-20 ENCOUNTER — Ambulatory Visit (INDEPENDENT_AMBULATORY_CARE_PROVIDER_SITE_OTHER): Payer: Medicare Other | Admitting: Family Medicine

## 2021-07-20 ENCOUNTER — Encounter: Payer: Self-pay | Admitting: Family Medicine

## 2021-07-20 VITALS — BP 138/80 | HR 66 | Temp 97.5°F | Wt 190.2 lb

## 2021-07-20 DIAGNOSIS — I10 Essential (primary) hypertension: Secondary | ICD-10-CM | POA: Diagnosis not present

## 2021-07-20 MED ORDER — LOSARTAN POTASSIUM 50 MG PO TABS: 50.0000 mg | ORAL_TABLET | Freq: Every day | ORAL | 2 refills | Status: AC

## 2021-07-20 NOTE — Progress Notes (Signed)
? ?  Subjective:  ? ? Patient ID: Sherry Chapman, female    DOB: 24-Oct-1955, 66 y.o.   MRN: [id number] ? ?HPI ?Here with concerns about her BP. Over the past month she has been checking the BP several times a day. She often gets readings up to 160/90 in the mornings, and this is down to 130-140/80 in the evenings. She does not have any symptoms when these swings occur. Currently she takes Amlodipine in the evenings and Nebivilol in the mornings. She is also worried about the potential for beta blockers to affect her dry eyes.  ? ? ?Review of Systems  ?Constitutional: Negative.   ?Respiratory: Negative.    ?Cardiovascular: Negative.   ?Neurological: Negative.   ? ?   ?Objective:  ? Physical Exam ?Constitutional:   ?   Appearance: Normal appearance.  ?Cardiovascular:  ?   Rate and Rhythm: Normal rate.  ?   Pulses: Normal pulses.  ?   Heart sounds: Normal heart sounds.  ?Pulmonary:  ?   Effort: Pulmonary effort is normal.  ?   Breath sounds: Normal breath sounds.  ?Neurological:  ?   Mental Status: She is alert.  ? ? ? ? ? ?   ?Assessment & Plan:  ?We will stop the Nebivilol and replace it with Losartan 50 mg every evening. She will keep the morning Amlodipine 5 mg the same. Recheck in 3-4 weeks.  ?Alysia Penna, MD ? ? ?

## 2021-07-21 ENCOUNTER — Encounter: Payer: Self-pay | Admitting: Family Medicine

## 2021-07-22 ENCOUNTER — Encounter: Payer: Self-pay | Admitting: Family Medicine

## 2021-07-22 ENCOUNTER — Telehealth: Payer: Self-pay | Admitting: Family Medicine

## 2021-07-22 NOTE — Telephone Encounter (Signed)
No need to taper

## 2021-07-22 NOTE — Telephone Encounter (Signed)
Message sent to pt via My Chart

## 2021-07-22 NOTE — Telephone Encounter (Signed)
Pt calling in stating provider advised her to call if her diastolic bp went over 712. Pt states it is now reading 101. Pt left message on mychart as well ?

## 2021-07-22 NOTE — Telephone Encounter (Signed)
That's to be expected. It takes 3-4 weeks for a new medication to reach therapeutic levels  ?

## 2021-07-25 NOTE — Telephone Encounter (Signed)
Reply sent to patient via MyChart message.

## 2021-07-27 ENCOUNTER — Ambulatory Visit: Payer: Medicare Other | Admitting: Physician Assistant

## 2021-07-27 DIAGNOSIS — H02824 Cysts of left upper eyelid: Secondary | ICD-10-CM | POA: Diagnosis not present

## 2021-08-02 DIAGNOSIS — I10 Essential (primary) hypertension: Secondary | ICD-10-CM | POA: Diagnosis not present

## 2021-08-31 NOTE — Telephone Encounter (Signed)
Disregard

## 2021-09-23 NOTE — Telephone Encounter (Signed)
error 

## 2021-10-12 ENCOUNTER — Telehealth: Payer: Self-pay

## 2021-10-12 NOTE — Telephone Encounter (Signed)
Last documented mammogram 10/24/17.  Attempted to call pt to get mammogram scheduled. If pt calls back, please transfer to [facility]

## 2021-10-13 NOTE — Telephone Encounter (Signed)
Pt states confirms her last mammogram was in 2019. States she is scheduled to have a mammogram 10/31/21 at [facility]. When asked who her GYN was pt states she does not remember. Line was disconnected.

## 2021-10-31 DIAGNOSIS — Z1231 Encounter for screening mammogram for malignant neoplasm of breast: Secondary | ICD-10-CM | POA: Diagnosis not present

## 2021-12-21 DIAGNOSIS — Z13220 Encounter for screening for lipoid disorders: Secondary | ICD-10-CM | POA: Diagnosis not present

## 2021-12-21 DIAGNOSIS — Z1159 Encounter for screening for other viral diseases: Secondary | ICD-10-CM | POA: Diagnosis not present

## 2021-12-21 DIAGNOSIS — Z0000 Encounter for general adult medical examination without abnormal findings: Secondary | ICD-10-CM | POA: Diagnosis not present

## 2021-12-21 DIAGNOSIS — Z13820 Encounter for screening for osteoporosis: Secondary | ICD-10-CM | POA: Diagnosis not present

## 2021-12-21 DIAGNOSIS — B351 Tinea unguium: Secondary | ICD-10-CM | POA: Diagnosis not present

## 2021-12-21 DIAGNOSIS — I10 Essential (primary) hypertension: Secondary | ICD-10-CM | POA: Diagnosis not present

## 2021-12-21 DIAGNOSIS — Z87898 Personal history of other specified conditions: Secondary | ICD-10-CM | POA: Diagnosis not present

## 2021-12-21 DIAGNOSIS — Z1211 Encounter for screening for malignant neoplasm of colon: Secondary | ICD-10-CM | POA: Diagnosis not present

## 2021-12-21 DIAGNOSIS — Z136 Encounter for screening for cardiovascular disorders: Secondary | ICD-10-CM | POA: Diagnosis not present

## 2021-12-21 DIAGNOSIS — Z23 Encounter for immunization: Secondary | ICD-10-CM | POA: Diagnosis not present

## 2022-01-24 DIAGNOSIS — M7022 Olecranon bursitis, left elbow: Secondary | ICD-10-CM | POA: Diagnosis not present

## 2022-05-16 DIAGNOSIS — H43393 Other vitreous opacities, bilateral: Secondary | ICD-10-CM | POA: Diagnosis not present

## 2022-06-13 ENCOUNTER — Telehealth: Payer: Self-pay | Admitting: Family Medicine

## 2022-06-13 DIAGNOSIS — H00024 Hordeolum internum left upper eyelid: Secondary | ICD-10-CM | POA: Diagnosis not present

## 2022-06-13 NOTE — Telephone Encounter (Signed)
Contacted Sherry Chapman to schedule their annual wellness visit. Call back at later date: Patient stated she would call back to schedule   Sherry Chapman AWV direct phone # [phone]   Spoke to patient she stated she would think about it but would call back later to schedule

## 2022-06-21 DIAGNOSIS — R7303 Prediabetes: Secondary | ICD-10-CM | POA: Diagnosis not present

## 2022-06-21 DIAGNOSIS — E785 Hyperlipidemia, unspecified: Secondary | ICD-10-CM | POA: Diagnosis not present

## 2022-06-21 DIAGNOSIS — I10 Essential (primary) hypertension: Secondary | ICD-10-CM | POA: Diagnosis not present

## 2022-07-11 DIAGNOSIS — R051 Acute cough: Secondary | ICD-10-CM | POA: Diagnosis not present

## 2022-08-14 ENCOUNTER — Telehealth: Payer: Self-pay | Admitting: Family Medicine

## 2022-08-14 NOTE — Telephone Encounter (Signed)
Called patient to schedule Medicare Annual Wellness Visit (AWV). No voicemail available to leave a message.  Last date of AWV: awvi 06/09/22 per [facility]  Please schedule an appointment at any time with [facility] or [employer] [facility] [employer].  If any questions, please contact me at [phone].  Thank you ,  Sherry Chapman AWV direct phone # [phone]

## 2022-09-28 ENCOUNTER — Other Ambulatory Visit: Payer: Self-pay

## 2022-09-28 ENCOUNTER — Encounter (HOSPITAL_COMMUNITY): Payer: Self-pay

## 2022-09-28 ENCOUNTER — Emergency Department (HOSPITAL_COMMUNITY)
Admission: EM | Admit: 2022-09-28 | Discharge: 2022-09-28 | Disposition: A | Discharge status: Home / Self Care | Payer: Medicare Other | Attending: Emergency Medicine | Admitting: Emergency Medicine

## 2022-09-28 ENCOUNTER — Emergency Department (HOSPITAL_COMMUNITY): Payer: Medicare Other

## 2022-09-28 DIAGNOSIS — Z79899 Other long term (current) drug therapy: Secondary | ICD-10-CM | POA: Diagnosis not present

## 2022-09-28 DIAGNOSIS — D72829 Elevated white blood cell count, unspecified: Secondary | ICD-10-CM | POA: Diagnosis not present

## 2022-09-28 DIAGNOSIS — R944 Abnormal results of kidney function studies: Secondary | ICD-10-CM | POA: Diagnosis not present

## 2022-09-28 DIAGNOSIS — R296 Repeated falls: Secondary | ICD-10-CM | POA: Diagnosis not present

## 2022-09-28 DIAGNOSIS — I10 Essential (primary) hypertension: Secondary | ICD-10-CM | POA: Insufficient documentation

## 2022-09-28 DIAGNOSIS — R112 Nausea with vomiting, unspecified: Secondary | ICD-10-CM

## 2022-09-28 DIAGNOSIS — N179 Acute kidney failure, unspecified: Secondary | ICD-10-CM | POA: Diagnosis not present

## 2022-09-28 DIAGNOSIS — S0990XA Unspecified injury of head, initial encounter: Secondary | ICD-10-CM | POA: Diagnosis not present

## 2022-09-28 DIAGNOSIS — H05222 Edema of left orbit: Secondary | ICD-10-CM | POA: Insufficient documentation

## 2022-09-28 DIAGNOSIS — R55 Syncope and collapse: Secondary | ICD-10-CM | POA: Diagnosis not present

## 2022-09-28 DIAGNOSIS — K529 Noninfective gastroenteritis and colitis, unspecified: Secondary | ICD-10-CM | POA: Diagnosis not present

## 2022-09-28 LAB — URINALYSIS, ROUTINE W REFLEX MICROSCOPIC
Bacteria, UA: NONE SEEN
Bilirubin Urine: NEGATIVE
Glucose, UA: NEGATIVE mg/dL
Hgb urine dipstick: NEGATIVE
Ketones, ur: NEGATIVE mg/dL
Nitrite: NEGATIVE
Protein, ur: 30 mg/dL — AB
Specific Gravity, Urine: 1.03 (ref 1.005–1.030)
pH: 5 (ref 5.0–8.0)

## 2022-09-28 LAB — BASIC METABOLIC PANEL
Anion gap: 8 (ref 5–15)
BUN: 28 mg/dL — ABNORMAL HIGH (ref 8–23)
CO2: 27 mmol/L (ref 22–32)
Calcium: 8.9 mg/dL (ref 8.9–10.3)
Chloride: 103 mmol/L (ref 98–111)
Creatinine, Ser: 1.21 mg/dL — ABNORMAL HIGH (ref 0.44–1.00)
GFR, Estimated: 49 mL/min — ABNORMAL LOW (ref >60)
Glucose, Bld: 144 mg/dL — ABNORMAL HIGH (ref 70–99)
Potassium: 3.7 mmol/L (ref 3.5–5.1)
Sodium: 138 mmol/L (ref 135–145)

## 2022-09-28 LAB — CBC
HCT: 45 % (ref 36.0–46.0)
Hemoglobin: 14.3 g/dL (ref 12.0–15.0)
MCH: 29.1 pg (ref 26.0–34.0)
MCHC: 31.8 g/dL (ref 30.0–36.0)
MCV: 91.6 fL (ref 80.0–100.0)
Platelets: 286 10*3/uL (ref 150–400)
RBC: 4.91 MIL/uL (ref 3.87–5.11)
RDW: 13.3 % (ref 11.5–15.5)
WBC: 11.2 10*3/uL — ABNORMAL HIGH (ref 4.0–10.5)
nRBC: 0 % (ref 0.0–0.2)

## 2022-09-28 MED ORDER — SODIUM CHLORIDE 0.9 % IV BOLUS
500.0000 mL | Freq: Once | INTRAVENOUS | Status: AC
  Administered 2022-09-28: 500 mL via INTRAVENOUS

## 2022-09-28 MED ORDER — ONDANSETRON 4 MG PO TBDP: 4.0000 mg | ORAL_TABLET | Freq: Three times a day (TID) | ORAL | 0 refills | Status: AC | PRN

## 2022-09-28 NOTE — ED Triage Notes (Signed)
Pt had diarrhea and vomiting all night. Pt then had syncopal episode when sitting on the toilet and hit head. Syncope lasted a few seconds. Pt thinks she hit her head on the tub. Denies headache. Slight lightheadedness this AM. Bruising noted to left eye. Seen at [facility] and they sent pt here. Received zofran at [facility].

## 2022-09-28 NOTE — ED Provider Notes (Signed)
[place] EMERGENCY DEPARTMENT AT [facility] Provider Note   CSN: [id number] Arrival date & time: 09/28/22  1550     History  Chief Complaint  Patient presents with   Loss of Consciousness   Head Injury    Sherry Chapman is a 68 y.o. female.   Loss of Consciousness Head Injury   67 year old female presents emergency department complaints of head injury.  Patient reports symptoms of vomiting as well as diarrhea beginning earlier this morning.  States that husband has been ill at home with similar symptoms occurring at 2 AM.  States that her symptoms began around 4 AM.  States that she has been "on the toilet" since her symptoms began and has been unable to tolerate p.o.  She states that while she was on the toilet, had a syncopal episode where she hit the front part of her head on a nearby tub.  States he lost consciousness for a matter of a second or 2 before she regained consciousness and her husband found her at bedside.  Was seen at urgent care prior to coming emergency department was given Zofran and states that she currently feels "significantly better."  Denies any visual disturbance, gait abnormality, weakness or sensory deficit of lower extremities, slurred speech, facial droop, anticoagulation use.  Denies chest pain, shortness of breath, abdominal pain, hematemesis, urinary symptoms, medic easier/melena.  Past medical history significant for neuromuscular disorder, hypertension, hyperlipidemia, GERD, diverticulosis,  Home Medications Prior to Admission medications   Medication Sig Start Date End Date Taking? Authorizing Provider  ondansetron (ZOFRAN-ODT) 4 MG disintegrating tablet Take 1 tablet (4 mg total) by mouth every 8 (eight) hours as needed for nausea or vomiting. 09/28/22  Yes Sherian Maroon A, PA  acetaminophen (TYLENOL) 325 MG tablet Take 325 mg by mouth every 6 (six) hours as needed.     [provider]  albuterol (VENTOLIN HFA) 108 (90 Base)  MCG/ACT inhaler albuterol sulfate HFA 90 mcg/actuation aerosol inhaler  Inhale 2 puffs every 4 hours by inhalation route as needed. Patient not taking: Reported on 07/20/2021    [provider]  amLODipine (NORVASC) 5 MG tablet TAKE 1 TABLET BY MOUTH EVERY DAY 07/14/20   Nelwyn Salisbury, MD  Famotidine (PEPCID AC PO) Take by mouth.    [provider]  lactase (LACTAID) 3000 units tablet Take by mouth 3 (three) times daily with meals. Patient not taking: Reported on 07/20/2021    [provider]  losartan (COZAAR) 50 MG tablet Take 1 tablet (50 mg total) by mouth daily. 07/20/21   Nelwyn Salisbury, MD  mometasone-formoterol (DULERA) 100-5 MCG/ACT AERO Inhale 2 puffs into the lungs daily. Patient not taking: Reported on 07/20/2021 04/07/21   Nelwyn Salisbury, MD  Multiple Vitamin (MULTIVITAMIN) tablet Take 1 tablet by mouth daily.    [provider]      Allergies    Prednisone, Clarithromycin, Clindamycin/lincomycin, Inderal [propranolol], and Levofloxacin    Review of Systems   Review of Systems  Cardiovascular:  Positive for syncope.    Physical Exam Updated Vital Signs BP 119/77 (BP Location: Right Arm)   Pulse 84   Temp 98 F (36.7 C) (Oral)   Resp 18   Ht 5\' 5"  (1.651 m)   Wt 82.6 kg   SpO2 99%   BMI 30.29 kg/m  Physical Exam Vitals and nursing note reviewed.  Constitutional:      General: She is not in acute distress.    Appearance: She  is well-developed.  HENT:     Head: Normocephalic.     Comments: Mild swelling just above left orbit.  No obvious palpable deformity.  Ecchymosis appreciated in the area. Eyes:     Conjunctiva/sclera: Conjunctivae normal.  Cardiovascular:     Rate and Rhythm: Normal rate and regular rhythm.     Heart sounds: No murmur heard. Pulmonary:     Effort: Pulmonary effort is normal. No respiratory distress.     Breath sounds: Normal breath sounds.  Abdominal:     Palpations: Abdomen is soft.     Tenderness:  There is no abdominal tenderness.  Musculoskeletal:        General: No swelling.     Cervical back: Neck supple.  Skin:    General: Skin is warm and dry.     Capillary Refill: Capillary refill takes less than 2 seconds.  Neurological:     Mental Status: She is alert.     Comments: Alert and oriented to self, place, time and event.   Speech is fluent, clear without dysarthria or dysphasia.   Strength symmetric in upper/lower extremities   Sensation intact in upper/lower extremities   Normal gait.  CN I not tested  CN II not tested  CN III, IV, VI PERRLA and EOMs intact bilaterally.  Not not proptotic.  No pain with EOMs. CN V Intact sensation to sharp and light touch to the face  CN VII facial movements symmetric  CN VIII not tested  CN IX, X no uvula deviation, symmetric rise of soft palate  CN XI symmetric SCM and trapezius strength bilaterally  CN XII Midline tongue protrusion, symmetric L/R movements     Psychiatric:        Mood and Affect: Mood normal.     ED Results / Procedures / Treatments   Labs (all labs ordered are listed, but only abnormal results are displayed) Labs Reviewed  BASIC METABOLIC PANEL - Abnormal; Notable for the following components:      Result Value   Glucose, Bld 144 (*)    BUN 28 (*)    Creatinine, Ser 1.21 (*)    GFR, Estimated 49 (*)    All other components within normal limits  CBC - Abnormal; Notable for the following components:   WBC 11.2 (*)    All other components within normal limits  URINALYSIS, ROUTINE W REFLEX MICROSCOPIC - Abnormal; Notable for the following components:   Protein, ur 30 (*)    Leukocytes,Ua TRACE (*)    All other components within normal limits  CBG MONITORING, ED    EKG EKG Interpretation  Date/Time:  Thursday September 28 2022 16:04:30 EDT Ventricular Rate:  86 PR Interval:  141 QRS Duration: 103 QT Interval:  380 QTC Calculation: 455 R Axis:   -47 Text Interpretation: Sinus rhythm LAD, consider  left anterior fascicular block Abnormal R-wave progression, early transition no wpw, prolonged qt or brugada No significant change since last tracing Confirmed by Melene Plan [id number]) on 09/28/2022 5:10:47 PM  Radiology CT Head Wo Contrast  Result Date: 09/28/2022 CLINICAL DATA:  Head trauma, moderate-severe Syncopal episode while on the toilet hitting head. EXAM: CT HEAD WITHOUT CONTRAST TECHNIQUE: Contiguous axial images were obtained from the base of the skull through the vertex without intravenous contrast. RADIATION DOSE REDUCTION: This exam was performed according to the departmental dose-optimization program which includes automated exposure control, adjustment of the mA and/or kV according to patient size and/or use of iterative reconstruction technique. COMPARISON:  Remote head CT 02/10/2003 FINDINGS: Brain: No intracranial hemorrhage, mass effect, or midline shift. No hydrocephalus. The basilar cisterns are patent. No evidence of territorial infarct or acute ischemia. No extra-axial or intracranial fluid collection. Vascular: No hyperdense vessel or unexpected calcification. Skull: No fracture or focal lesion. Sinuses/Orbits: Minor mucosal thickening of ethmoid air cells. No acute findings. No mastoid effusion. Other: Minimal left periorbital soft tissue thickening. IMPRESSION: Minimal left periorbital soft tissue thickening. No acute intracranial abnormality. No skull fracture. Electronically Signed   By: Narda Rutherford M.D.   On: 09/28/2022 16:36    Procedures Procedures    Medications Ordered in ED Medications  sodium chloride 0.9 % bolus 500 mL (has no administration in time range)  sodium chloride 0.9 % bolus 500 mL (500 mLs Intravenous New Bag/Given 09/28/22 1706)    ED Course/ Medical Decision Making/ A&P Clinical Course as of 09/28/22 1745  Thu Sep 28, 2022  1729 Shared decision-making conversation was had with patient regarding evidence of AKI with elevated creatinine of 1.21,  BUN of 28 and GFR of 49.  Offered admission for continued IV fluids and monitoring of patient's renal function but patient declined at this time for outpatient management of symptoms and recheck with primary care. [CR]    Clinical Course User Index [CR] Peter Garter, PA                             Medical Decision Making Amount and/or Complexity of Data Reviewed Labs: ordered. Radiology: ordered.  Risk Prescription drug management.   This patient presents to the ED for concern of syncope, this involves an extensive number of treatment options, and is a complaint that carries with it a high risk of complications and morbidity.  The differential diagnosis includes arrhythmia, CVA, valvular disorder, structural heart disorder, PE, sepsis, anemia, vasovagal,  Co morbidities that complicate the patient evaluation  See HPI   Additional history obtained:  Additional history obtained from EMR External records from outside source obtained and reviewed including hospital records   Lab Tests:  I Ordered, and personally interpreted labs.  The pertinent results include: Mild leukocytosis of 11.2.  No evidence of anemia.  Platelets within range.  No electrolyte abnormalities.  Patient with evidence of AKI with creatinine 1.21, BUN of 28 and GFR 49.  UA significant for trace leukocytes, 30 proteins otherwise unremarkable.   Imaging Studies ordered:  I ordered imaging studies including CT head I independently visualized and interpreted imaging which showed no acute intracranial abnormality.  Minimal left periorbital soft tissue thickening. I agree with the radiologist interpretation  Cardiac Monitoring: / EKG:  The patient was maintained on a cardiac monitor.  I personally viewed and interpreted the cardiac monitored which showed an underlying rhythm of: Sinus rhythm with left axis deviation.  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  No significant change from prior EKG  performed.  Consultations Obtained:  N/a   Problem List / ED Course / Critical interventions / Medication management  Syncope nausea, vomiting, AKI I ordered medication including 1 L normal saline   Reevaluation of the patient after these medicines showed that the patient improved I have reviewed the patients home medicines and have made adjustments as needed   Social Determinants of Health:  Denies tobacco, illicit drug use   Test / Admission - Considered:  Syncope nausea, vomiting, AKI Vitals signs  within normal range and stable throughout visit. Laboratory/imaging studies significant for: See  above 67 year old female presents emergency department after syncopal episode.  Syncopal episode most likely vasovagal in nature and given evidence of vomiting just prior to set episode.  Patient with mild head trauma but patient without any acute neurologic deficits and negative CT imaging.  Patient given dose of Zofran at urgent care prior to visit emergency department with complete resolution of symptoms.  Patient's laboratory studies remarkable for AKI.  Shared decision made conversation was held with patient regarding admission given evidence of AKI but patient declined at this time.  She was willing to at least receive 1 L of fluids while in the ED and was sent home with oral antiemetic with close follow-up with primary care in the outpatient setting.  Will recommend strict return precautions and close follow-up with primary care for repeat checking of renal function.  Treatment plan discussed at length with patient and she acknowledged understanding and was agreeable to said plan.  Patient overall well-appearing, afebrile in no acute distress. Worrisome signs and symptoms were discussed with the patient, and the patient acknowledged understanding to return to the ED if noticed. Patient was stable upon discharge.          Final Clinical Impression(s) / ED Diagnoses Final diagnoses:   Syncope and collapse  AKI (acute kidney injury) (HCC)  Nausea and vomiting, unspecified vomiting type    Rx / DC Orders ED Discharge Orders          Ordered    ondansetron (ZOFRAN-ODT) 4 MG disintegrating tablet  Every 8 hours PRN        09/28/22 1736              Peter Garter, [place] 09/28/22 1745    Terald Sleeper, MD 09/28/22 1747

## 2022-09-28 NOTE — Discharge Instructions (Addendum)
No fevers at emergency department today was overall consistent with evidence of kidney injury most likely secondary from dehydration from vomiting/diarrhea.  Your CT scan did not show any evidence of brain bleed or fracture/dislocation.  Will send in medication to use as needed for nausea/vomiting called Zofran.  Recommend artificial segmentation in the form of Pedialyte, sugar-free Gatorade, liquid IV.  Recommend bland diet over the next few days until able to tolerate more complex foods.  Regarding evidence of kidney injury, recommend repeat laboratory studies by primary care within 2 to 3 days.  You may use over-the-counter Imodium as needed for persistent/excessive diarrhea.  Please not hesitate to return to emergency department for worrisome signs and symptoms we discussed become apparent.

## 2022-10-02 DIAGNOSIS — W19XXXD Unspecified fall, subsequent encounter: Secondary | ICD-10-CM | POA: Diagnosis not present

## 2022-10-02 DIAGNOSIS — R55 Syncope and collapse: Secondary | ICD-10-CM | POA: Diagnosis not present

## 2022-10-02 DIAGNOSIS — K529 Noninfective gastroenteritis and colitis, unspecified: Secondary | ICD-10-CM | POA: Diagnosis not present

## 2022-10-02 DIAGNOSIS — E860 Dehydration: Secondary | ICD-10-CM | POA: Diagnosis not present

## 2022-10-02 DIAGNOSIS — Z09 Encounter for follow-up examination after completed treatment for conditions other than malignant neoplasm: Secondary | ICD-10-CM | POA: Diagnosis not present

## 2022-11-13 DIAGNOSIS — M62830 Muscle spasm of back: Secondary | ICD-10-CM | POA: Diagnosis not present

## 2022-12-19 DIAGNOSIS — H524 Presbyopia: Secondary | ICD-10-CM | POA: Diagnosis not present

## 2022-12-19 DIAGNOSIS — H04123 Dry eye syndrome of bilateral lacrimal glands: Secondary | ICD-10-CM | POA: Diagnosis not present

## 2022-12-19 DIAGNOSIS — H52223 Regular astigmatism, bilateral: Secondary | ICD-10-CM | POA: Diagnosis not present

## 2022-12-19 DIAGNOSIS — H2513 Age-related nuclear cataract, bilateral: Secondary | ICD-10-CM | POA: Diagnosis not present

## 2022-12-19 DIAGNOSIS — H53143 Visual discomfort, bilateral: Secondary | ICD-10-CM | POA: Diagnosis not present

## 2023-01-02 DIAGNOSIS — Z0000 Encounter for general adult medical examination without abnormal findings: Secondary | ICD-10-CM | POA: Diagnosis not present

## 2023-01-02 DIAGNOSIS — Z780 Asymptomatic menopausal state: Secondary | ICD-10-CM | POA: Diagnosis not present

## 2023-01-02 DIAGNOSIS — Z9181 History of falling: Secondary | ICD-10-CM | POA: Diagnosis not present

## 2023-01-02 DIAGNOSIS — I10 Essential (primary) hypertension: Secondary | ICD-10-CM | POA: Diagnosis not present

## 2023-01-02 DIAGNOSIS — Z23 Encounter for immunization: Secondary | ICD-10-CM | POA: Diagnosis not present

## 2023-01-02 DIAGNOSIS — E785 Hyperlipidemia, unspecified: Secondary | ICD-10-CM | POA: Diagnosis not present

## 2023-01-02 DIAGNOSIS — Z7185 Encounter for immunization safety counseling: Secondary | ICD-10-CM | POA: Diagnosis not present

## 2023-01-02 DIAGNOSIS — R7303 Prediabetes: Secondary | ICD-10-CM | POA: Diagnosis not present

## 2023-02-14 ENCOUNTER — Encounter: Payer: Self-pay | Admitting: Gastroenterology

## 2023-02-27 DIAGNOSIS — I10 Essential (primary) hypertension: Secondary | ICD-10-CM | POA: Diagnosis not present

## 2023-02-27 DIAGNOSIS — E785 Hyperlipidemia, unspecified: Secondary | ICD-10-CM | POA: Diagnosis not present

## 2023-02-27 DIAGNOSIS — R7303 Prediabetes: Secondary | ICD-10-CM | POA: Diagnosis not present

## 2023-03-26 DIAGNOSIS — I10 Essential (primary) hypertension: Secondary | ICD-10-CM | POA: Diagnosis not present

## 2023-04-15 DIAGNOSIS — R1032 Left lower quadrant pain: Secondary | ICD-10-CM | POA: Diagnosis not present

## 2023-04-27 DIAGNOSIS — R059 Cough, unspecified: Secondary | ICD-10-CM | POA: Diagnosis not present

## 2023-07-06 DIAGNOSIS — R7303 Prediabetes: Secondary | ICD-10-CM | POA: Diagnosis not present

## 2023-07-06 DIAGNOSIS — I10 Essential (primary) hypertension: Secondary | ICD-10-CM | POA: Diagnosis not present

## 2023-07-25 DIAGNOSIS — M542 Cervicalgia: Secondary | ICD-10-CM | POA: Diagnosis not present

## 2023-07-25 DIAGNOSIS — M25512 Pain in left shoulder: Secondary | ICD-10-CM | POA: Diagnosis not present

## 2023-08-16 DIAGNOSIS — M542 Cervicalgia: Secondary | ICD-10-CM | POA: Diagnosis not present

## 2023-08-16 DIAGNOSIS — M7502 Adhesive capsulitis of left shoulder: Secondary | ICD-10-CM | POA: Diagnosis not present

## 2023-09-12 ENCOUNTER — Encounter (HOSPITAL_BASED_OUTPATIENT_CLINIC_OR_DEPARTMENT_OTHER): Payer: Self-pay | Admitting: Physical Therapy

## 2023-09-12 ENCOUNTER — Ambulatory Visit (HOSPITAL_BASED_OUTPATIENT_CLINIC_OR_DEPARTMENT_OTHER): Attending: Sports Medicine | Admitting: Physical Therapy

## 2023-09-12 ENCOUNTER — Other Ambulatory Visit: Payer: Self-pay

## 2023-09-12 DIAGNOSIS — M25612 Stiffness of left shoulder, not elsewhere classified: Secondary | ICD-10-CM | POA: Diagnosis not present

## 2023-09-12 DIAGNOSIS — R29898 Other symptoms and signs involving the musculoskeletal system: Secondary | ICD-10-CM

## 2023-09-12 DIAGNOSIS — M6281 Muscle weakness (generalized): Secondary | ICD-10-CM | POA: Diagnosis not present

## 2023-09-12 DIAGNOSIS — M542 Cervicalgia: Secondary | ICD-10-CM | POA: Diagnosis not present

## 2023-09-12 DIAGNOSIS — M25512 Pain in left shoulder: Secondary | ICD-10-CM | POA: Diagnosis not present

## 2023-09-12 NOTE — Therapy (Addendum)
 OUTPATIENT PHYSICAL THERAPY CERVICAL EVALUATION   Patient Name: Sherry Chapman MRN: [id number] DOB:10-25-1955, 68 y.o., female Today's Date: 09/12/2023  END OF SESSION:  PT End of Session - 09/12/23 1133     Visit Number 1    Number of Visits 16    Date for PT Re-Evaluation 11/07/23    Authorization Type [employer] MEDICARE    Progress Note Due on Visit 10    PT Start Time 1145    PT Stop Time 1224    PT Time Calculation (min) 39 min    Activity Tolerance Patient tolerated treatment well    Behavior During Therapy WFL for tasks assessed/performed             Past Medical History:  Diagnosis Date   Allergy    Anemia    past hx    Anxiety    Arthritis    neck and upper back    Back pain    Benign neoplasm of adrenal gland    Depression    Diverticulosis    01-15-2008 colon and 01/2018 colon   GERD (gastroesophageal reflux disease)    Hematuria, unspecified    Hx of colonic polyps 2019   Hyperlipidemia    Hypertension    Lactose intolerance    uses lactaid prn    Neuromuscular disorder (HCC)    RLS    Restless leg syndrome    Ulnar abutment syndrome    bilaterally    Vertigo    Past Surgical History:  Procedure Laterality Date   COLONOSCOPY  03/01/2018   per Dr. General Kenner, sessile serrated polyp, repeat in 5 yrs    ganglion cyst removed Left    06-05-2008 Dr Artemio Larry- left wrist    NASAL SINUS SURGERY     nasal reconstruction   toe surgery     Patient Active Problem List   Diagnosis Date Noted   Environmental and seasonal allergies 07/01/2021   Depression with anxiety 06/02/2019   Cough 06/20/2011   Benign neoplasm of adrenal gland 12/20/2009   BACK PAIN, LUMBAR 07/21/2009   HEMATURIA UNSPECIFIED 07/16/2009   RESTLESS LEG SYNDROME 05/01/2008   LEG CRAMPS, NOCTURNAL 02/11/2008   HYPERLIPIDEMIA 12/03/2007   GERD 12/03/2007   VERTIGO 11/21/2007   Essential hypertension 01/21/2007    PCP: Jinger Mount, DO   REFERRING PROVIDER: Stafford Eagles,  MD  REFERRING DIAG: M54.2 (ICD-10-CM) - Cervicalgia; L adhesive capsulitis   THERAPY DIAG:  Left shoulder pain, unspecified chronicity  Stiffness of left shoulder, not elsewhere classified  Cervicalgia  Muscle weakness (generalized)  Other symptoms and signs involving the musculoskeletal system  Rationale for Evaluation and Treatment: Rehabilitation  ONSET DATE: 6-7 Months  SUBJECTIVE:  SUBJECTIVE STATEMENT: Patient states L adhesive capsulitis with shoulder pain and some pain up into neck. Patient states shoulder is moving better. 6-7 months ago was holding grandchild who thrust backward and it really irritated shoulder. Pain is not as intense as it was. Doing stretches provided by MD. Hansel Ley like she needs to build strength in the upper body. Worked on a computer for many years.   PERTINENT HISTORY:  Hx back pain/arthritis, HLD, HTN, pre-diabetes  PAIN:  Are you having pain? No at rest; 3/10 with stretches; worst 6/10 at night with turning in wrong position  PRECAUTIONS: None  WEIGHT BEARING RESTRICTIONS: No  FALLS:  Has patient fallen in last 6 months? No  PLOF: Independent  PATIENT GOALS: improve motion and reduction of pain and stiffness in neck and shoulder  OBJECTIVE: (objective measures from initial evaluation unless otherwise dated)  PATIENT SURVEYS:  UEFS  Extreme difficulty/unable (0), Quite a bit of difficulty (1), Moderate difficulty (2), Little difficulty (3), No difficulty (4) Survey date:  09/12/23  Any of your usual work, household or school activities 3  2. Your usual hobbies, recreational/sport activities 3   3. Lifting a bag of groceries to waist level 3   4. Lifting a bag of groceries above your head 3  5. Grooming your hair 3  6. Pushing up on your  hands (I.e. from bathtub or chair) 4  7. Preparing food (I.e. peeling/cutting) 3  8. Driving  3  9. Vacuuming, sweeping, or raking 3  10. Dressing  3  11. Doing up buttons 4  12. Using tools/appliances 4  13. Opening doors 3  14. Cleaning  3  15. Tying or lacing shoes 3  16. Sleeping  2  17. Laundering clothes (I.e. washing, ironing, folding) 3  18. Opening a jar 3  19. Throwing a ball 2  20. Carrying a small suitcase with your affected limb.  4  Score total:  64/80     COGNITION: Overall cognitive status: Within functional limits for tasks assessed  SENSATION: WFL  POSTURE: rounded shoulders, forward head, and increased thoracic kyphosis  PALPATION: TTP L pec, UT, LS   CERVICAL ROM:   Active ROM A/PROM (deg) eval  Flexion 35  Extension 40  Right lateral flexion 8  Left lateral flexion 12  Right rotation 45  Left rotation 48   (Blank rows = not tested) *=pain/symptoms  UPPER EXTREMITY ROM: PROM flexion 130 on L  Active ROM Right eval Left eval  Shoulder flexion 155 120*  Shoulder extension    Shoulder abduction 165 123* favors flexion  Shoulder adduction    Shoulder extension    Shoulder internal rotation T7 L3*  Shoulder external rotation T5 T3*  Elbow flexion    Elbow extension    Wrist flexion    Wrist extension    Wrist ulnar deviation    Wrist radial deviation    Wrist pronation    Wrist supination     (Blank rows = not tested) *=pain/symptoms  UPPER EXTREMITY MMT:  MMT Right eval Left eval  Shoulder flexion 5 4-*  Shoulder extension    Shoulder abduction 4+ 4-*  Shoulder adduction    Shoulder extension    Shoulder internal rotation 5 5  Shoulder external rotation 4+ 4+  Middle trapezius    Lower trapezius    Elbow flexion 5 5  Elbow extension 5 5  Wrist flexion    Wrist extension    Wrist ulnar deviation    Wrist  radial deviation    Wrist pronation    Wrist supination    Grip strength     (Blank rows = not tested)  *=pain/symptoms    TODAY'S TREATMENT:                                                                                                                              DATE:  09/12/23  Eval, education, HEP Pulley flexion 4 minutes  PATIENT EDUCATION:  Education details: Patient educated on exam findings, POC, scope of PT, HEP, shoulder/cervical spine pathology, and relevant anatomy and biomechanics. Person educated: Patient Education method: Explanation, Demonstration, and Handouts Education comprehension: verbalized understanding, returned demonstration, verbal cues required, and tactile cues required  HOME EXERCISE PROGRAM: Access Code: [email or web address] URL: [email or web address] Date: 09/12/2023 Prepared by: Debria Fang Valeen Borys  Exercises - Shoulder Flexion Wall Slide with Towel (Mirrored)  - 2-3 x daily - 7 x weekly - 2 sets - 10 reps - Supine Shoulder Flexion Extension AAROM with Dowel  - 2-3 x daily - 7 x weekly - 2 sets - 10 reps - Supine Shoulder External Rotation with Dowel  - 2-3 x daily - 7 x weekly - 2 sets - 10 reps - Seated Scapular Retraction  - 2-3 x daily - 7 x weekly - 2 sets - 10 reps  ASSESSMENT:  CLINICAL IMPRESSION: Patient a 68 y.o. y.o. female who was seen today for physical therapy evaluation and treatment for L shoulder pain/stiffness and neck pain/stiffness. Patient presents with pain limited deficits in L shoulder and cervical spine strength, ROM, endurance, activity tolerance, and functional mobility with ADL. Patient is having to modify and restrict ADL as indicated by outcome measure score as well as subjective information and objective measures which is affecting overall participation. Patient will benefit from skilled physical therapy in order to improve function and reduce impairment.  OBJECTIVE IMPAIRMENTS: decreased activity tolerance, decreased endurance, decreased mobility, decreased ROM, decreased strength, hypomobility, increased fascial  restrictions, impaired flexibility, improper body mechanics, postural dysfunction, and pain.   ACTIVITY LIMITATIONS: carrying, lifting, bending, sleeping, bathing, dressing, reach over head, hygiene/grooming, and caring for others  PARTICIPATION LIMITATIONS: meal prep, cleaning, laundry, shopping, community activity, and yard work  PERSONAL FACTORS: Fitness, Time since onset of injury/illness/exacerbation, and 1-2 comorbidities: Hx back pain/arthritis, HLD, HTN, pre-diabetes are also affecting patient's functional outcome.   REHAB POTENTIAL: Good  CLINICAL DECISION MAKING: Evolving/moderate complexity  EVALUATION COMPLEXITY: Moderate   GOALS: Goals reviewed with patient? Yes  SHORT TERM GOALS: Target date: 10/10/2023    Patient will be independent with HEP in order to improve functional outcomes. Baseline: Goal status: INITIAL  2.  Patient will report at least 25% improvement in symptoms for improved quality of life. Baseline:  Goal status: INITIAL    LONG TERM GOALS: Target date: 11/07/2023    Patient will report at least 75% improvement in symptoms for improved quality of life. Baseline:  Goal status: INITIAL  2.  Patient  will improve UEFS score by at least 9 points in order to indicate improved tolerance to activity. Baseline:  Goal status: INITIAL  3.  Patient will demonstrate at least 10d improvement in cervical ROM in all restricted planes for improved ability to move head while driving. Baseline:  Goal status: INITIAL  4.  Patient will demonstrate at least 150 degrees of shoulder flexion for improved ability to reach overhead.   Baseline:  Goal status: INITIAL  5.  Patient will demonstrate grade of 5/5 MMT grade in all tested musculature as evidence of improved strength to assist with lifting at home. Baseline:  Goal status: INITIAL       PLAN:  PT FREQUENCY: 1-2x/week  PT DURATION: 8 weeks  PLANNED INTERVENTIONS: [id number]- PT Re-evaluation,  97110-Therapeutic exercises, 97530- Therapeutic activity, [id number]- Neuromuscular re-education, 97535- Self Care, 78469- Manual therapy, [id number]- Gait training, [phone]- Orthotic Fit/training, [phone]- Canalith repositioning, [id number]- Aquatic Therapy, [phone]- Splinting, [phone]- Wound care (first 20 sq cm), 97598- Wound care (each additional 20 sq cm)Patient/Family education, Balance training, Stair training, Taping, Dry Needling, Joint mobilization, Joint manipulation, Spinal manipulation, Spinal mobilization, Scar mobilization, and DME instructions.  PLAN FOR NEXT SESSION: manual for pain /mobility to shoulder, pec, c/sp; L shoulder mobility and strengthening, cervical mobility, postural strength, thoracic mobility   Beather Liming, PT, DPT 09/12/2023, 12:21 PM   Date of referral: 08/20/23 Referring provider: Stafford Eagles, MD Referring diagnosis? M54.2 (ICD-10-CM) - Cervicalgia; L adhesive capsulitis  Treatment diagnosis? (if different than referring diagnosis) M25.512   What was this (referring dx) caused by? Ongoing Issue  Lonne Roan of Condition: Chronic (continuous duration > 3 months)   Laterality: Lt  Current Functional Measure Score: Other UEFS 64/80  Objective measurements identify impairments when they are compared to normal values, the uninvolved extremity, and prior level of function.  [x]  Yes  []  No  Objective assessment of functional ability: Moderate functional limitations   Briefly describe symptoms: neck pain/stiffness, L shoulder pain weakness, stiffness  How did symptoms start: insidous onset of adhesive capsulitis 6-7 months ago  Average pain intensity:  Last 24 hours: 0-6/10  Past week: 0-6/10  How often does the pt experience symptoms? Frequently  How much have the symptoms interfered with usual daily activities? Moderately  How has condition changed since care began at this facility? NA - initial visit  In general, how is the patients overall health? Good   BACK  PAIN (STarT Back Screening Tool) No

## 2023-10-17 ENCOUNTER — Ambulatory Visit (HOSPITAL_BASED_OUTPATIENT_CLINIC_OR_DEPARTMENT_OTHER): Attending: Sports Medicine

## 2023-10-17 ENCOUNTER — Encounter (HOSPITAL_BASED_OUTPATIENT_CLINIC_OR_DEPARTMENT_OTHER): Payer: Self-pay

## 2023-10-17 DIAGNOSIS — M25612 Stiffness of left shoulder, not elsewhere classified: Secondary | ICD-10-CM | POA: Insufficient documentation

## 2023-10-17 DIAGNOSIS — M6281 Muscle weakness (generalized): Secondary | ICD-10-CM | POA: Diagnosis not present

## 2023-10-17 DIAGNOSIS — M25512 Pain in left shoulder: Secondary | ICD-10-CM | POA: Diagnosis not present

## 2023-10-17 DIAGNOSIS — M542 Cervicalgia: Secondary | ICD-10-CM | POA: Insufficient documentation

## 2023-10-17 DIAGNOSIS — R29898 Other symptoms and signs involving the musculoskeletal system: Secondary | ICD-10-CM | POA: Diagnosis not present

## 2023-10-17 NOTE — Therapy (Signed)
 OUTPATIENT PHYSICAL THERAPY CERVICAL TREATMENT   Patient Name: Sherry Chapman MRN: [id number] DOB:1955-07-02, 68 y.o., female Today's Date: 10/17/2023  END OF SESSION:  PT End of Session - 10/17/23 1141     Visit Number 2    Number of Visits 16    Date for PT Re-Evaluation 11/07/23    Authorization Type [employer] MEDICARE    Progress Note Due on Visit 10    PT Start Time 1104    PT Stop Time 1145    PT Time Calculation (min) 41 min    Activity Tolerance Patient tolerated treatment well    Behavior During Therapy WFL for tasks assessed/performed           Past Medical History:  Diagnosis Date   Allergy    Anemia    past hx    Anxiety    Arthritis    neck and upper back    Back pain    Benign neoplasm of adrenal gland    Depression    Diverticulosis    01-15-2008 colon and 01/2018 colon   GERD (gastroesophageal reflux disease)    Hematuria, unspecified    Hx of colonic polyps 2019   Hyperlipidemia    Hypertension    Lactose intolerance    uses lactaid prn    Neuromuscular disorder (HCC)    RLS    Restless leg syndrome    Ulnar abutment syndrome    bilaterally    Vertigo    Past Surgical History:  Procedure Laterality Date   COLONOSCOPY  03/01/2018   per Dr. Leigh, sessile serrated polyp, repeat in 5 yrs    ganglion cyst removed Left    06-05-2008 Dr Juli- left wrist    NASAL SINUS SURGERY     nasal reconstruction   toe surgery     Patient Active Problem List   Diagnosis Date Noted   Environmental and seasonal allergies 07/01/2021   Depression with anxiety 06/02/2019   Cough 06/20/2011   Benign neoplasm of adrenal gland 12/20/2009   BACK PAIN, LUMBAR 07/21/2009   HEMATURIA UNSPECIFIED 07/16/2009   RESTLESS LEG SYNDROME 05/01/2008   LEG CRAMPS, NOCTURNAL 02/11/2008   HYPERLIPIDEMIA 12/03/2007   GERD 12/03/2007   VERTIGO 11/21/2007   Essential hypertension 01/21/2007    PCP: Cindy Clotilda HERO, DO   REFERRING PROVIDER: Arnaldo Juliene RAMAN,  MD  REFERRING DIAG: M54.2 (ICD-10-CM) - Cervicalgia; L adhesive capsulitis   THERAPY DIAG:  Left shoulder pain, unspecified chronicity  Stiffness of left shoulder, not elsewhere classified  Cervicalgia  Other symptoms and signs involving the musculoskeletal system  Muscle weakness (generalized)  Rationale for Evaluation and Treatment: Rehabilitation  ONSET DATE: 6-7 Months  SUBJECTIVE:  SUBJECTIVE STATEMENT: Pt reports mild progress. Pain with certain movements. Tries to stretch arm throughout the day. Neck is getting better overall but still bothers her with certain movements.   Eval: Patient states L adhesive capsulitis with shoulder pain and some pain up into neck. Patient states shoulder is moving better. 6-7 months ago was holding grandchild who thrust backward and it really irritated shoulder. Pain is not as intense as it was. Doing stretches provided by MD. Alvis like she needs to build strength in the upper body. Worked on a computer for many years.   PERTINENT HISTORY:  Hx back pain/arthritis, HLD, HTN, pre-diabetes  PAIN:  Are you having pain? No at rest; 3/10 with stretches; worst 6/10 at night with turning in wrong position  PRECAUTIONS: None  WEIGHT BEARING RESTRICTIONS: No  FALLS:  Has patient fallen in last 6 months? No  PLOF: Independent  PATIENT GOALS: improve motion and reduction of pain and stiffness in neck and shoulder  OBJECTIVE: (objective measures from initial evaluation unless otherwise dated)  PATIENT SURVEYS:  UEFS  Extreme difficulty/unable (0), Quite a bit of difficulty (1), Moderate difficulty (2), Little difficulty (3), No difficulty (4) Survey date:  09/12/23  Any of your usual work, household or school activities 3  2. Your usual hobbies,  recreational/sport activities 3   3. Lifting a bag of groceries to waist level 3   4. Lifting a bag of groceries above your head 3  5. Grooming your hair 3  6. Pushing up on your hands (I.e. from bathtub or chair) 4  7. Preparing food (I.e. peeling/cutting) 3  8. Driving  3  9. Vacuuming, sweeping, or raking 3  10. Dressing  3  11. Doing up buttons 4  12. Using tools/appliances 4  13. Opening doors 3  14. Cleaning  3  15. Tying or lacing shoes 3  16. Sleeping  2  17. Laundering clothes (I.e. washing, ironing, folding) 3  18. Opening a jar 3  19. Throwing a ball 2  20. Carrying a small suitcase with your affected limb.  4  Score total:  64/80     COGNITION: Overall cognitive status: Within functional limits for tasks assessed  SENSATION: WFL  POSTURE: rounded shoulders, forward head, and increased thoracic kyphosis  PALPATION: TTP L pec, UT, LS   CERVICAL ROM:   Active ROM A/PROM (deg) eval  Flexion 35  Extension 40  Right lateral flexion 8  Left lateral flexion 12  Right rotation 45  Left rotation 48   (Blank rows = not tested) *=pain/symptoms  UPPER EXTREMITY ROM: PROM flexion 130 on L  Active ROM Right eval Left eval  Shoulder flexion 155 120*  Shoulder extension    Shoulder abduction 165 123* favors flexion  Shoulder adduction    Shoulder extension    Shoulder internal rotation T7 L3*  Shoulder external rotation T5 T3*  Elbow flexion    Elbow extension    Wrist flexion    Wrist extension    Wrist ulnar deviation    Wrist radial deviation    Wrist pronation    Wrist supination     (Blank rows = not tested) *=pain/symptoms  UPPER EXTREMITY MMT:  MMT Right eval Left eval  Shoulder flexion 5 4-*  Shoulder extension    Shoulder abduction 4+ 4-*  Shoulder adduction    Shoulder extension    Shoulder internal rotation 5 5  Shoulder external rotation 4+ 4+  Middle trapezius    Lower trapezius  Elbow flexion 5 5  Elbow extension 5 5  Wrist  flexion    Wrist extension    Wrist ulnar deviation    Wrist radial deviation    Wrist pronation    Wrist supination    Grip strength     (Blank rows = not tested) *=pain/symptoms    TODAY'S TREATMENT:                                                                                                                              DATE:   7/9 STM to bil UT, pecs PROM L shoulder GHJ mobilizations inf and posterior glides grade II-III Seated UT stretching bil LS stretching bil Doorway pec stretch (low) Supine cane flexion x10 with 5 hold at end range Instruction in theracane HEP update/review  09/12/23  Eval, education, HEP Pulley flexion 4 minutes  PATIENT EDUCATION:  Education details: Patient educated on exam findings, POC, scope of PT, HEP, shoulder/cervical spine pathology, and relevant anatomy and biomechanics. Person educated: Patient Education method: Explanation, Demonstration, and Handouts Education comprehension: verbalized understanding, returned demonstration, verbal cues required, and tactile cues required  HOME EXERCISE PROGRAM: Access Code: [email or web address] URL: [email or web address] Date: 09/12/2023 Prepared by: Prentice Zaunegger  Exercises - Shoulder Flexion Wall Slide with Towel (Mirrored)  - 2-3 x daily - 7 x weekly - 2 sets - 10 reps - Supine Shoulder Flexion Extension AAROM with Dowel  - 2-3 x daily - 7 x weekly - 2 sets - 10 reps - Supine Shoulder External Rotation with Dowel  - 2-3 x daily - 7 x weekly - 2 sets - 10 reps - Seated Scapular Retraction  - 2-3 x daily - 7 x weekly - 2 sets - 10 reps  ASSESSMENT:  CLINICAL IMPRESSION: Pt with significant tightness in bilateral shoulder girdles. Spent time instructing pt in targeting stretching to address this with cuing provided for proper performance. She responded well to stretching and reported relief. Pt also c/o muscle spasms in shoulder blade region. Instructed in use of theracane for self  release of this at home. Good tolerance for GHJ mobilizations with report of mild discomfort. Provided tp with updated HEP to include stretching. Educated about DOMS and managements. Will continue to progress as tolerated.   Eval: Patient a 68 y.o. y.o. female who was seen today for physical therapy evaluation and treatment for L shoulder pain/stiffness and neck pain/stiffness. Patient presents with pain limited deficits in L shoulder and cervical spine strength, ROM, endurance, activity tolerance, and functional mobility with ADL. Patient is having to modify and restrict ADL as indicated by outcome measure score as well as subjective information and objective measures which is affecting overall participation. Patient will benefit from skilled physical therapy in order to improve function and reduce impairment.  OBJECTIVE IMPAIRMENTS: decreased activity tolerance, decreased endurance, decreased mobility, decreased ROM, decreased strength, hypomobility, increased fascial restrictions, impaired flexibility, improper body mechanics, postural dysfunction, and pain.   ACTIVITY LIMITATIONS:  carrying, lifting, bending, sleeping, bathing, dressing, reach over head, hygiene/grooming, and caring for others  PARTICIPATION LIMITATIONS: meal prep, cleaning, laundry, shopping, community activity, and yard work  PERSONAL FACTORS: Fitness, Time since onset of injury/illness/exacerbation, and 1-2 comorbidities: Hx back pain/arthritis, HLD, HTN, pre-diabetes are also affecting patient's functional outcome.   REHAB POTENTIAL: Good  CLINICAL DECISION MAKING: Evolving/moderate complexity  EVALUATION COMPLEXITY: Moderate   GOALS: Goals reviewed with patient? Yes  SHORT TERM GOALS: Target date: 10/10/2023    Patient will be independent with HEP in order to improve functional outcomes. Baseline: Goal status: INITIAL  2.  Patient will report at least 25% improvement in symptoms for improved quality of  life. Baseline:  Goal status: INITIAL    LONG TERM GOALS: Target date: 11/07/2023    Patient will report at least 75% improvement in symptoms for improved quality of life. Baseline:  Goal status: INITIAL  2.  Patient will improve UEFS score by at least 9 points in order to indicate improved tolerance to activity. Baseline:  Goal status: INITIAL  3.  Patient will demonstrate at least 10d improvement in cervical ROM in all restricted planes for improved ability to move head while driving. Baseline:  Goal status: INITIAL  4.  Patient will demonstrate at least 150 degrees of shoulder flexion for improved ability to reach overhead.   Baseline:  Goal status: INITIAL  5.  Patient will demonstrate grade of 5/5 MMT grade in all tested musculature as evidence of improved strength to assist with lifting at home. Baseline:  Goal status: INITIAL       PLAN:  PT FREQUENCY: 1-2x/week  PT DURATION: 8 weeks  PLANNED INTERVENTIONS: [id number]- PT Re-evaluation, 97110-Therapeutic exercises, 97530- Therapeutic activity, [id number]- Neuromuscular re-education, 97535- Self Care, [id number]- Manual therapy, [id number]- Gait training, [phone]- Orthotic Fit/training, [phone]- Canalith repositioning, [id number]- Aquatic Therapy, [phone]- Splinting, [phone]- Wound care (first 20 sq cm), 97598- Wound care (each additional 20 sq cm)Patient/Family education, Balance training, Stair training, Taping, Dry Needling, Joint mobilization, Joint manipulation, Spinal manipulation, Spinal mobilization, Scar mobilization, and DME instructions.  PLAN FOR NEXT SESSION: manual for pain /mobility to shoulder, pec, c/sp; L shoulder mobility and strengthening, cervical mobility, postural strength, thoracic mobility   Asberry FORBES Rodes, PTA 10/17/2023, 12:14 PM   Date of referral: 08/20/23 Referring provider: Arnaldo Juliene RAMAN, MD Referring diagnosis? M54.2 (ICD-10-CM) - Cervicalgia; L adhesive capsulitis  Treatment diagnosis? (if different than  referring diagnosis) M25.512   What was this (referring dx) caused by? Ongoing Issue  Lysle of Condition: Chronic (continuous duration > 3 months)   Laterality: Lt  Current Functional Measure Score: Other UEFS 64/80  Objective measurements identify impairments when they are compared to normal values, the uninvolved extremity, and prior level of function.  [x]  Yes  []  No  Objective assessment of functional ability: Moderate functional limitations   Briefly describe symptoms: neck pain/stiffness, L shoulder pain weakness, stiffness  How did symptoms start: insidous onset of adhesive capsulitis 6-7 months ago  Average pain intensity:  Last 24 hours: 0-6/10  Past week: 0-6/10  How often does the pt experience symptoms? Frequently  How much have the symptoms interfered with usual daily activities? Moderately  How has condition changed since care began at this facility? NA - initial visit  In general, how is the patients overall health? Good   BACK PAIN (STarT Back Screening Tool) No

## 2023-10-25 ENCOUNTER — Encounter (HOSPITAL_BASED_OUTPATIENT_CLINIC_OR_DEPARTMENT_OTHER): Payer: Self-pay

## 2023-10-25 ENCOUNTER — Ambulatory Visit (HOSPITAL_BASED_OUTPATIENT_CLINIC_OR_DEPARTMENT_OTHER)

## 2023-10-25 DIAGNOSIS — M25612 Stiffness of left shoulder, not elsewhere classified: Secondary | ICD-10-CM | POA: Diagnosis not present

## 2023-10-25 DIAGNOSIS — M6281 Muscle weakness (generalized): Secondary | ICD-10-CM | POA: Diagnosis not present

## 2023-10-25 DIAGNOSIS — M25512 Pain in left shoulder: Secondary | ICD-10-CM | POA: Diagnosis not present

## 2023-10-25 DIAGNOSIS — R29898 Other symptoms and signs involving the musculoskeletal system: Secondary | ICD-10-CM

## 2023-10-25 DIAGNOSIS — M542 Cervicalgia: Secondary | ICD-10-CM | POA: Diagnosis not present

## 2023-10-25 NOTE — Therapy (Signed)
 OUTPATIENT PHYSICAL THERAPY CERVICAL TREATMENT   Patient Name: Sherry Chapman MRN: [id number] DOB:09-22-1955, 68 y.o., female Today's Date: 10/25/2023  END OF SESSION:  PT End of Session - 10/25/23 1109     Visit Number 3    Number of Visits 16    Date for PT Re-Evaluation 11/07/23    Authorization Type [employer] MEDICARE    Progress Note Due on Visit 10    PT Start Time 1103    PT Stop Time 1145    PT Time Calculation (min) 42 min    Activity Tolerance Patient tolerated treatment well    Behavior During Therapy WFL for tasks assessed/performed            Past Medical History:  Diagnosis Date   Allergy    Anemia    past hx    Anxiety    Arthritis    neck and upper back    Back pain    Benign neoplasm of adrenal gland    Depression    Diverticulosis    01-15-2008 colon and 01/2018 colon   GERD (gastroesophageal reflux disease)    Hematuria, unspecified    Hx of colonic polyps 2019   Hyperlipidemia    Hypertension    Lactose intolerance    uses lactaid prn    Neuromuscular disorder (HCC)    RLS    Restless leg syndrome    Ulnar abutment syndrome    bilaterally    Vertigo    Past Surgical History:  Procedure Laterality Date   COLONOSCOPY  03/01/2018   per Dr. Leigh, sessile serrated polyp, repeat in 5 yrs    ganglion cyst removed Left    06-05-2008 Dr Juli- left wrist    NASAL SINUS SURGERY     nasal reconstruction   toe surgery     Patient Active Problem List   Diagnosis Date Noted   Environmental and seasonal allergies 07/01/2021   Depression with anxiety 06/02/2019   Cough 06/20/2011   Benign neoplasm of adrenal gland 12/20/2009   BACK PAIN, LUMBAR 07/21/2009   HEMATURIA UNSPECIFIED 07/16/2009   RESTLESS LEG SYNDROME 05/01/2008   LEG CRAMPS, NOCTURNAL 02/11/2008   HYPERLIPIDEMIA 12/03/2007   GERD 12/03/2007   VERTIGO 11/21/2007   Essential hypertension 01/21/2007    PCP: Cindy Clotilda HERO, DO   REFERRING PROVIDER: Arnaldo Juliene RAMAN,  MD  REFERRING DIAG: M54.2 (ICD-10-CM) - Cervicalgia; L adhesive capsulitis   THERAPY DIAG:  Left shoulder pain, unspecified chronicity  Stiffness of left shoulder, not elsewhere classified  Cervicalgia  Other symptoms and signs involving the musculoskeletal system  Muscle weakness (generalized)  Rationale for Evaluation and Treatment: Rehabilitation  ONSET DATE: 6-7 Months  SUBJECTIVE:  SUBJECTIVE STATEMENT: Pt reports  significant improvement in neck and shoulder pain/tightness. Has been compliant with HEP. It gets sore, but then it feels better. I can see little progressions.  Eval: Patient states L adhesive capsulitis with shoulder pain and some pain up into neck. Patient states shoulder is moving better. 6-7 months ago was holding grandchild who thrust backward and it really irritated shoulder. Pain is not as intense as it was. Doing stretches provided by MD. Alvis like she needs to build strength in the upper body. Worked on a computer for many years.   PERTINENT HISTORY:  Hx back pain/arthritis, HLD, HTN, pre-diabetes  PAIN:  Are you having pain? No at rest; 3/10 with stretches; worst 6/10 at night with turning in wrong position  PRECAUTIONS: None  WEIGHT BEARING RESTRICTIONS: No  FALLS:  Has patient fallen in last 6 months? No  PLOF: Independent  PATIENT GOALS: improve motion and reduction of pain and stiffness in neck and shoulder  OBJECTIVE: (objective measures from initial evaluation unless otherwise dated)  PATIENT SURVEYS:  UEFS  Extreme difficulty/unable (0), Quite a bit of difficulty (1), Moderate difficulty (2), Little difficulty (3), No difficulty (4) Survey date:  09/12/23  Any of your usual work, household or school activities 3  2. Your usual hobbies,  recreational/sport activities 3   3. Lifting a bag of groceries to waist level 3   4. Lifting a bag of groceries above your head 3  5. Grooming your hair 3  6. Pushing up on your hands (I.e. from bathtub or chair) 4  7. Preparing food (I.e. peeling/cutting) 3  8. Driving  3  9. Vacuuming, sweeping, or raking 3  10. Dressing  3  11. Doing up buttons 4  12. Using tools/appliances 4  13. Opening doors 3  14. Cleaning  3  15. Tying or lacing shoes 3  16. Sleeping  2  17. Laundering clothes (I.e. washing, ironing, folding) 3  18. Opening a jar 3  19. Throwing a ball 2  20. Carrying a small suitcase with your affected limb.  4  Score total:  64/80     COGNITION: Overall cognitive status: Within functional limits for tasks assessed  SENSATION: WFL  POSTURE: rounded shoulders, forward head, and increased thoracic kyphosis  PALPATION: TTP L pec, UT, LS   CERVICAL ROM:   Active ROM A/PROM (deg) eval  Flexion 35  Extension 40  Right lateral flexion 8  Left lateral flexion 12  Right rotation 45  Left rotation 48   (Blank rows = not tested) *=pain/symptoms  UPPER EXTREMITY ROM: PROM flexion 130 on L  Active ROM Right eval Left eval L 7/17  Shoulder flexion 155 120* 150 active assist with cane  Shoulder extension     Shoulder abduction 165 123* favors flexion   Shoulder adduction     Shoulder extension     Shoulder internal rotation T7 L3*   Shoulder external rotation T5 T3*   Elbow flexion     Elbow extension     Wrist flexion     Wrist extension     Wrist ulnar deviation     Wrist radial deviation     Wrist pronation     Wrist supination      (Blank rows = not tested) *=pain/symptoms  UPPER EXTREMITY MMT:  MMT Right eval Left eval  Shoulder flexion 5 4-*  Shoulder extension    Shoulder abduction 4+ 4-*  Shoulder adduction    Shoulder extension  Shoulder internal rotation 5 5  Shoulder external rotation 4+ 4+  Middle trapezius    Lower trapezius     Elbow flexion 5 5  Elbow extension 5 5  Wrist flexion    Wrist extension    Wrist ulnar deviation    Wrist radial deviation    Wrist pronation    Wrist supination    Grip strength     (Blank rows = not tested) *=pain/symptoms    TODAY'S TREATMENT:                                                                                                                              DATE:   7/17 STM to L UT, pecs, deltoid PROM L shoulder GHJ distraction  Seated UT stretching bil LS stretching bil Doorway pec stretch (low) (single arm) Supine cane flexion x10 with 5 hold at end range Supine active shoulder flexion x10 Supine active shoulder abduction x5 (challenging)   7/9 STM to bil UT, pecs PROM L shoulder GHJ mobilizations inf and posterior glides grade II-III Seated UT stretching bil LS stretching bil Doorway pec stretch (low) Supine cane flexion x10 with 5 hold at end range Instruction in theracane HEP update/review  09/12/23  Eval, education, HEP Pulley flexion 4 minutes  PATIENT EDUCATION:  Education details: Patient educated on exam findings, POC, scope of PT, HEP, shoulder/cervical spine pathology, and relevant anatomy and biomechanics. Person educated: Patient Education method: Explanation, Demonstration, and Handouts Education comprehension: verbalized understanding, returned demonstration, verbal cues required, and tactile cues required  HOME EXERCISE PROGRAM: Access Code: [email or web address] URL: [email or web address] Date: 09/12/2023 Prepared by: Prentice Zaunegger  Exercises - Shoulder Flexion Wall Slide with Towel (Mirrored)  - 2-3 x daily - 7 x weekly - 2 sets - 10 reps - Supine Shoulder Flexion Extension AAROM with Dowel  - 2-3 x daily - 7 x weekly - 2 sets - 10 reps - Supine Shoulder External Rotation with Dowel  - 2-3 x daily - 7 x weekly - 2 sets - 10 reps - Seated Scapular Retraction  - 2-3 x daily - 7 x weekly - 2 sets - 10  reps  ASSESSMENT:  CLINICAL IMPRESSION: Continued to work on improving shoulder ROM as she remains limited into end ranges. Tightness still present in upper trap, though improving. Switched doorway stretch to single arm with improved results. Reviewed UT stretching as she continues to require cues for correct technique with this. Trialed active s/l abduction with difficulty noted and discomfort in deltoid and pec areas. STM performed to these areas as well as upper traps. Pt will benefit from continued PT to improve scapular and GHJ mobility/function.  Eval: Patient a 67 y.o. y.o. female who was seen today for physical therapy evaluation and treatment for L shoulder pain/stiffness and neck pain/stiffness. Patient presents with pain limited deficits in L shoulder and cervical spine strength, ROM, endurance, activity tolerance, and functional mobility with ADL. Patient is having to  modify and restrict ADL as indicated by outcome measure score as well as subjective information and objective measures which is affecting overall participation. Patient will benefit from skilled physical therapy in order to improve function and reduce impairment.  OBJECTIVE IMPAIRMENTS: decreased activity tolerance, decreased endurance, decreased mobility, decreased ROM, decreased strength, hypomobility, increased fascial restrictions, impaired flexibility, improper body mechanics, postural dysfunction, and pain.   ACTIVITY LIMITATIONS: carrying, lifting, bending, sleeping, bathing, dressing, reach over head, hygiene/grooming, and caring for others  PARTICIPATION LIMITATIONS: meal prep, cleaning, laundry, shopping, community activity, and yard work  PERSONAL FACTORS: Fitness, Time since onset of injury/illness/exacerbation, and 1-2 comorbidities: Hx back pain/arthritis, HLD, HTN, pre-diabetes are also affecting patient's functional outcome.   REHAB POTENTIAL: Good  CLINICAL DECISION MAKING: Evolving/moderate  complexity  EVALUATION COMPLEXITY: Moderate   GOALS: Goals reviewed with patient? Yes  SHORT TERM GOALS: Target date: 10/10/2023    Patient will be independent with HEP in order to improve functional outcomes. Baseline: Goal status: MET 7/17  2.  Patient will report at least 25% improvement in symptoms for improved quality of life. Baseline:  Goal status: IN PROGRESS 7/17    LONG TERM GOALS: Target date: 11/07/2023    Patient will report at least 75% improvement in symptoms for improved quality of life. Baseline:  Goal status: INITIAL  2.  Patient will improve UEFS score by at least 9 points in order to indicate improved tolerance to activity. Baseline:  Goal status: INITIAL  3.  Patient will demonstrate at least 10d improvement in cervical ROM in all restricted planes for improved ability to move head while driving. Baseline:  Goal status: INITIAL  4.  Patient will demonstrate at least 150 degrees of shoulder flexion for improved ability to reach overhead.   Baseline:  Goal status: INITIAL  5.  Patient will demonstrate grade of 5/5 MMT grade in all tested musculature as evidence of improved strength to assist with lifting at home. Baseline:  Goal status: INITIAL       PLAN:  PT FREQUENCY: 1-2x/week  PT DURATION: 8 weeks  PLANNED INTERVENTIONS: [id number]- PT Re-evaluation, 97110-Therapeutic exercises, 97530- Therapeutic activity, [id number]- Neuromuscular re-education, 97535- Self Care, [id number]- Manual therapy, [id number]- Gait training, [phone]- Orthotic Fit/training, [phone]- Canalith repositioning, [id number]- Aquatic Therapy, [phone]- Splinting, [phone]- Wound care (first 20 sq cm), 97598- Wound care (each additional 20 sq cm)Patient/Family education, Balance training, Stair training, Taping, Dry Needling, Joint mobilization, Joint manipulation, Spinal manipulation, Spinal mobilization, Scar mobilization, and DME instructions.  PLAN FOR NEXT SESSION: manual for pain /mobility to shoulder,  pec, c/sp; L shoulder mobility and strengthening, cervical mobility, postural strength, thoracic mobility   Asberry FORBES Rodes, PTA 10/25/2023, 11:54 AM   Date of referral: 08/20/23 Referring provider: Arnaldo Juliene RAMAN, MD Referring diagnosis? M54.2 (ICD-10-CM) - Cervicalgia; L adhesive capsulitis  Treatment diagnosis? (if different than referring diagnosis) M25.512   What was this (referring dx) caused by? Ongoing Issue  Lysle of Condition: Chronic (continuous duration > 3 months)   Laterality: Lt  Current Functional Measure Score: Other UEFS 64/80  Objective measurements identify impairments when they are compared to normal values, the uninvolved extremity, and prior level of function.  [x]  Yes  []  No  Objective assessment of functional ability: Moderate functional limitations   Briefly describe symptoms: neck pain/stiffness, L shoulder pain weakness, stiffness  How did symptoms start: insidous onset of adhesive capsulitis 6-7 months ago  Average pain intensity:  Last 24 hours: 0-6/10  Past week: 0-6/10  How often does the  pt experience symptoms? Frequently  How much have the symptoms interfered with usual daily activities? Moderately  How has condition changed since care began at this facility? NA - initial visit  In general, how is the patients overall health? Good   BACK PAIN (STarT Back Screening Tool) No

## 2023-10-31 ENCOUNTER — Encounter (HOSPITAL_BASED_OUTPATIENT_CLINIC_OR_DEPARTMENT_OTHER): Payer: Self-pay

## 2023-10-31 ENCOUNTER — Ambulatory Visit (HOSPITAL_BASED_OUTPATIENT_CLINIC_OR_DEPARTMENT_OTHER)

## 2023-10-31 DIAGNOSIS — M25612 Stiffness of left shoulder, not elsewhere classified: Secondary | ICD-10-CM

## 2023-10-31 DIAGNOSIS — M25512 Pain in left shoulder: Secondary | ICD-10-CM

## 2023-10-31 DIAGNOSIS — M6281 Muscle weakness (generalized): Secondary | ICD-10-CM | POA: Diagnosis not present

## 2023-10-31 DIAGNOSIS — M542 Cervicalgia: Secondary | ICD-10-CM | POA: Diagnosis not present

## 2023-10-31 DIAGNOSIS — R29898 Other symptoms and signs involving the musculoskeletal system: Secondary | ICD-10-CM | POA: Diagnosis not present

## 2023-10-31 NOTE — Therapy (Signed)
 OUTPATIENT PHYSICAL THERAPY CERVICAL TREATMENT   Patient Name: Sherry Chapman MRN: [id number] DOB:[birth year], 68 y.o., female Today's Date: 10/31/2023  END OF SESSION:  PT End of Session - 10/31/23 1153     Visit Number 4    Number of Visits 16    Date for PT Re-Evaluation 11/07/23    Authorization Type [employer] MEDICARE    Authorization Time Period 10/17/23-12/12/23    Authorization - Visit Number 3    Authorization - Number of Visits 12    Progress Note Due on Visit 10    PT Start Time 1150    PT Stop Time 1230    PT Time Calculation (min) 40 min    Activity Tolerance Patient tolerated treatment well    Behavior During Therapy WFL for tasks assessed/performed             Past Medical History:  Diagnosis Date   Allergy    Anemia    past hx    Anxiety    Arthritis    neck and upper back    Back pain    Benign neoplasm of adrenal gland    Depression    Diverticulosis    01-15-2008 colon and 01/2018 colon   GERD (gastroesophageal reflux disease)    Hematuria, unspecified    Hx of colonic polyps 2019   Hyperlipidemia    Hypertension    Lactose intolerance    uses lactaid prn    Neuromuscular disorder (HCC)    RLS    Restless leg syndrome    Ulnar abutment syndrome    bilaterally    Vertigo    Past Surgical History:  Procedure Laterality Date   COLONOSCOPY  03/01/2018   per Dr. Leigh, sessile serrated polyp, repeat in 5 yrs    ganglion cyst removed Left    06-05-2008 Dr Juli- left wrist    NASAL SINUS SURGERY     nasal reconstruction   toe surgery     Patient Active Problem List   Diagnosis Date Noted   Environmental and seasonal allergies 07/01/2021   Depression with anxiety 06/02/2019   Cough 06/20/2011   Benign neoplasm of adrenal gland 12/20/2009   BACK PAIN, LUMBAR 07/21/2009   HEMATURIA UNSPECIFIED 07/16/2009   RESTLESS LEG SYNDROME 05/01/2008   LEG CRAMPS, NOCTURNAL 02/11/2008   HYPERLIPIDEMIA 12/03/2007   GERD 12/03/2007   VERTIGO  11/21/2007   Essential hypertension 01/21/2007    PCP: Cindy Clotilda HERO, DO   REFERRING PROVIDER: Arnaldo Juliene RAMAN, MD  REFERRING DIAG: M54.2 (ICD-10-CM) - Cervicalgia; L adhesive capsulitis   THERAPY DIAG:  Left shoulder pain, unspecified chronicity  Stiffness of left shoulder, not elsewhere classified  Cervicalgia  Other symptoms and signs involving the musculoskeletal system  Muscle weakness (generalized)  Rationale for Evaluation and Treatment: Rehabilitation  ONSET DATE: 6-7 Months  SUBJECTIVE:  SUBJECTIVE STATEMENT: Pt reports she may have over did it a little the other day, felt better after resting a day. She reports compliance with HEP. Feels overall improvement in pain and mobility.   Eval: Patient states L adhesive capsulitis with shoulder pain and some pain up into neck. Patient states shoulder is moving better. 6-7 months ago was holding grandchild who thrust backward and it really irritated shoulder. Pain is not as intense as it was. Doing stretches provided by MD. Alvis like she needs to build strength in the upper body. Worked on a computer for many years.   PERTINENT HISTORY:  Hx back pain/arthritis, HLD, HTN, pre-diabetes  PAIN:  Are you having pain? No at rest; 3/10 with stretches; worst 6/10 at night with turning in wrong position  PRECAUTIONS: None  WEIGHT BEARING RESTRICTIONS: No  FALLS:  Has patient fallen in last 6 months? No  PLOF: Independent  PATIENT GOALS: improve motion and reduction of pain and stiffness in neck and shoulder  OBJECTIVE: (objective measures from initial evaluation unless otherwise dated)  PATIENT SURVEYS:  UEFS  Extreme difficulty/unable (0), Quite a bit of difficulty (1), Moderate difficulty (2), Little difficulty (3),  No difficulty (4) Survey date:  09/12/23  Any of your usual work, household or school activities 3  2. Your usual hobbies, recreational/sport activities 3   3. Lifting a bag of groceries to waist level 3   4. Lifting a bag of groceries above your head 3  5. Grooming your hair 3  6. Pushing up on your hands (I.e. from bathtub or chair) 4  7. Preparing food (I.e. peeling/cutting) 3  8. Driving  3  9. Vacuuming, sweeping, or raking 3  10. Dressing  3  11. Doing up buttons 4  12. Using tools/appliances 4  13. Opening doors 3  14. Cleaning  3  15. Tying or lacing shoes 3  16. Sleeping  2  17. Laundering clothes (I.e. washing, ironing, folding) 3  18. Opening a jar 3  19. Throwing a ball 2  20. Carrying a small suitcase with your affected limb.  4  Score total:  64/80     COGNITION: Overall cognitive status: Within functional limits for tasks assessed  SENSATION: WFL  POSTURE: rounded shoulders, forward head, and increased thoracic kyphosis  PALPATION: TTP L pec, UT, LS   CERVICAL ROM:   Active ROM A/PROM (deg) eval  Flexion 35  Extension 40  Right lateral flexion 8  Left lateral flexion 12  Right rotation 45  Left rotation 48   (Blank rows = not tested) *=pain/symptoms  UPPER EXTREMITY ROM: PROM flexion 130 on L  Active ROM Right eval Left eval L 7/17  Shoulder flexion 155 120* 150 active assist with cane  Shoulder extension     Shoulder abduction 165 123* favors flexion   Shoulder adduction     Shoulder extension     Shoulder internal rotation T7 L3*   Shoulder external rotation T5 T3*   Elbow flexion     Elbow extension     Wrist flexion     Wrist extension     Wrist ulnar deviation     Wrist radial deviation     Wrist pronation     Wrist supination      (Blank rows = not tested) *=pain/symptoms  UPPER EXTREMITY MMT:  MMT Right eval Left eval  Shoulder flexion 5 4-*  Shoulder extension    Shoulder abduction 4+ 4-*  Shoulder adduction  Shoulder extension    Shoulder internal rotation 5 5  Shoulder external rotation 4+ 4+  Middle trapezius    Lower trapezius    Elbow flexion 5 5  Elbow extension 5 5  Wrist flexion    Wrist extension    Wrist ulnar deviation    Wrist radial deviation    Wrist pronation    Wrist supination    Grip strength     (Blank rows = not tested) *=pain/symptoms    TODAY'S TREATMENT:                                                                                                                              DATE:   7/23 STM to L UT, pecs, deltoid PROM L shoulder GHJ distraction  GHJ mobilizations inferior and posterior glides grade II-III Doorway pec stretch (low) (single arm) Supine cane flexion x10 with 5 2# bar hold at end range Supine active shoulder flexion x10 Seated press out 2x10 OH press 2x10 Posterior capsule stretch (ER at doorway) Pulleys flexion, scaption   7/17 STM to L UT, pecs, deltoid PROM L shoulder GHJ distraction  Seated UT stretching bil LS stretching bil Doorway pec stretch (low) (single arm) Supine cane flexion x10 with 5 hold at end range Supine active shoulder flexion x10 Supine active shoulder abduction x5 (challenging)   7/9 STM to bil UT, pecs PROM L shoulder GHJ mobilizations inf and posterior glides grade II-III Seated UT stretching bil LS stretching bil Doorway pec stretch (low) Supine cane flexion x10 with 5 hold at end range Instruction in theracane HEP update/review  09/12/23  Eval, education, HEP Pulley flexion 4 minutes  PATIENT EDUCATION:  Education details: Patient educated on exam findings, POC, scope of PT, HEP, shoulder/cervical spine pathology, and relevant anatomy and biomechanics. Person educated: Patient Education method: Explanation, Demonstration, and Handouts Education comprehension: verbalized understanding, returned demonstration, verbal cues required, and tactile cues required  HOME EXERCISE  PROGRAM: Access Code: [email or web address] URL: [email or web address] Date: 09/12/2023 Prepared by: Prentice Zaunegger  Exercises - Shoulder Flexion Wall Slide with Towel (Mirrored)  - 2-3 x daily - 7 x weekly - 2 sets - 10 reps - Supine Shoulder Flexion Extension AAROM with Dowel  - 2-3 x daily - 7 x weekly - 2 sets - 10 reps - Supine Shoulder External Rotation with Dowel  - 2-3 x daily - 7 x weekly - 2 sets - 10 reps - Seated Scapular Retraction  - 2-3 x daily - 7 x weekly - 2 sets - 10 reps  ASSESSMENT:  CLINICAL IMPRESSION: Reviewed doorway stretching for more targeted posterior capsule stretch, where she feels limited. She remains limited in passive ROM, though appears to be improving compared ot previous sessions. With pulleys, she had pain with scaption movement following flexion, but this improved after performing pendulums. Good tolerance for shoulder AROM progressions working on functional movements. Will continue to progress as tolerated.   Eval: Patient  a 68 y.o. y.o. female who was seen today for physical therapy evaluation and treatment for L shoulder pain/stiffness and neck pain/stiffness. Patient presents with pain limited deficits in L shoulder and cervical spine strength, ROM, endurance, activity tolerance, and functional mobility with ADL. Patient is having to modify and restrict ADL as indicated by outcome measure score as well as subjective information and objective measures which is affecting overall participation. Patient will benefit from skilled physical therapy in order to improve function and reduce impairment.  OBJECTIVE IMPAIRMENTS: decreased activity tolerance, decreased endurance, decreased mobility, decreased ROM, decreased strength, hypomobility, increased fascial restrictions, impaired flexibility, improper body mechanics, postural dysfunction, and pain.   ACTIVITY LIMITATIONS: carrying, lifting, bending, sleeping, bathing, dressing, reach over head,  hygiene/grooming, and caring for others  PARTICIPATION LIMITATIONS: meal prep, cleaning, laundry, shopping, community activity, and yard work  PERSONAL FACTORS: Fitness, Time since onset of injury/illness/exacerbation, and 1-2 comorbidities: Hx back pain/arthritis, HLD, HTN, pre-diabetes are also affecting patient's functional outcome.   REHAB POTENTIAL: Good  CLINICAL DECISION MAKING: Evolving/moderate complexity  EVALUATION COMPLEXITY: Moderate   GOALS: Goals reviewed with patient? Yes  SHORT TERM GOALS: Target date: 10/10/2023    Patient will be independent with HEP in order to improve functional outcomes. Baseline: Goal status: MET 7/17  2.  Patient will report at least 25% improvement in symptoms for improved quality of life. Baseline:  Goal status: IN PROGRESS 7/17    LONG TERM GOALS: Target date: 11/07/2023    Patient will report at least 75% improvement in symptoms for improved quality of life. Baseline:  Goal status: INITIAL  2.  Patient will improve UEFS score by at least 9 points in order to indicate improved tolerance to activity. Baseline:  Goal status: INITIAL  3.  Patient will demonstrate at least 10d improvement in cervical ROM in all restricted planes for improved ability to move head while driving. Baseline:  Goal status: INITIAL  4.  Patient will demonstrate at least 150 degrees of shoulder flexion for improved ability to reach overhead.   Baseline:  Goal status: INITIAL  5.  Patient will demonstrate grade of 5/5 MMT grade in all tested musculature as evidence of improved strength to assist with lifting at home. Baseline:  Goal status: INITIAL       PLAN:  PT FREQUENCY: 1-2x/week  PT DURATION: 8 weeks  PLANNED INTERVENTIONS: 97164- PT Re-evaluation, 97110-Therapeutic exercises, 97530- Therapeutic activity, [id number]- Neuromuscular re-education, 97535- Self Care, [id number]- Manual therapy, [id number]- Gait training, [phone]- Orthotic Fit/training, [phone]-  Canalith repositioning, [id number]- Aquatic Therapy, [phone]- Splinting, [id number]- Wound care (first 20 sq cm), 97598- Wound care (each additional 20 sq cm)Patient/Family education, Balance training, Stair training, Taping, Dry Needling, Joint mobilization, Joint manipulation, Spinal manipulation, Spinal mobilization, Scar mobilization, and DME instructions.  PLAN FOR NEXT SESSION: manual for pain /mobility to shoulder, pec, c/sp; L shoulder mobility and strengthening, cervical mobility, postural strength, thoracic mobility   Asberry FORBES Rodes, PTA 10/31/2023, 1:50 PM   Date of referral: 08/20/23 Referring provider: Arnaldo Juliene RAMAN, MD Referring diagnosis? M54.2 (ICD-10-CM) - Cervicalgia; L adhesive capsulitis  Treatment diagnosis? (if different than referring diagnosis) M25.512   What was this (referring dx) caused by? Ongoing Issue  Lysle of Condition: Chronic (continuous duration > 3 months)   Laterality: Lt  Current Functional Measure Score: Other UEFS 64/80  Objective measurements identify impairments when they are compared to normal values, the uninvolved extremity, and prior level of function.  [x]  Yes  []  No  Objective assessment of  functional ability: Moderate functional limitations   Briefly describe symptoms: neck pain/stiffness, L shoulder pain weakness, stiffness  How did symptoms start: insidous onset of adhesive capsulitis 6-7 months ago  Average pain intensity:  Last 24 hours: 0-6/10  Past week: 0-6/10  How often does the pt experience symptoms? Frequently  How much have the symptoms interfered with usual daily activities? Moderately  How has condition changed since care began at this facility? NA - initial visit  In general, how is the patients overall health? Good   BACK PAIN (STarT Back Screening Tool) No

## 2023-11-10 ENCOUNTER — Encounter (HOSPITAL_BASED_OUTPATIENT_CLINIC_OR_DEPARTMENT_OTHER): Admitting: Physical Therapy

## 2023-11-21 ENCOUNTER — Ambulatory Visit (HOSPITAL_BASED_OUTPATIENT_CLINIC_OR_DEPARTMENT_OTHER): Attending: Sports Medicine | Admitting: Physical Therapy

## 2023-11-21 DIAGNOSIS — M25512 Pain in left shoulder: Secondary | ICD-10-CM | POA: Insufficient documentation

## 2023-11-21 DIAGNOSIS — M25612 Stiffness of left shoulder, not elsewhere classified: Secondary | ICD-10-CM | POA: Diagnosis not present

## 2023-11-21 DIAGNOSIS — R29898 Other symptoms and signs involving the musculoskeletal system: Secondary | ICD-10-CM | POA: Diagnosis not present

## 2023-11-21 DIAGNOSIS — M6281 Muscle weakness (generalized): Secondary | ICD-10-CM | POA: Insufficient documentation

## 2023-11-21 DIAGNOSIS — M542 Cervicalgia: Secondary | ICD-10-CM | POA: Insufficient documentation

## 2023-11-21 NOTE — Therapy (Signed)
 OUTPATIENT PHYSICAL THERAPY CERVICAL TREATMENT   Patient Name: Sherry Chapman MRN: [id number] DOB:1955/11/01, 68 y.o., female Today's Date: 11/21/2023  Progress Note   Reporting Period 09/12/23 to 11/21/23   See note below for Objective Data and Assessment of Progress/Goals   END OF SESSION:  PT End of Session - 11/21/23 1018     Visit Number 5    Number of Visits 24    Date for PT Re-Evaluation 01/16/24    Authorization Type [employer] MEDICARE    Authorization Time Period 10/17/23-12/12/23    Authorization - Visit Number 5    Authorization - Number of Visits 12    Progress Note Due on Visit 15    PT Start Time 1018    PT Stop Time 1058    PT Time Calculation (min) 40 min    Activity Tolerance Patient tolerated treatment well    Behavior During Therapy WFL for tasks assessed/performed             Past Medical History:  Diagnosis Date   Allergy    Anemia    past hx    Anxiety    Arthritis    neck and upper back    Back pain    Benign neoplasm of adrenal gland    Depression    Diverticulosis    01-15-2008 colon and 01/2018 colon   GERD (gastroesophageal reflux disease)    Hematuria, unspecified    Hx of colonic polyps 2019   Hyperlipidemia    Hypertension    Lactose intolerance    uses lactaid prn    Neuromuscular disorder (HCC)    RLS    Restless leg syndrome    Ulnar abutment syndrome    bilaterally    Vertigo    Past Surgical History:  Procedure Laterality Date   COLONOSCOPY  03/01/2018   per Dr. Leigh, sessile serrated polyp, repeat in 5 yrs    ganglion cyst removed Left    06-05-2008 Dr Juli- left wrist    NASAL SINUS SURGERY     nasal reconstruction   toe surgery     Patient Active Problem List   Diagnosis Date Noted   Environmental and seasonal allergies 07/01/2021   Depression with anxiety 06/02/2019   Cough 06/20/2011   Benign neoplasm of adrenal gland 12/20/2009   BACK PAIN, LUMBAR 07/21/2009   HEMATURIA UNSPECIFIED 07/16/2009    RESTLESS LEG SYNDROME 05/01/2008   LEG CRAMPS, NOCTURNAL 02/11/2008   HYPERLIPIDEMIA 12/03/2007   GERD 12/03/2007   VERTIGO 11/21/2007   Essential hypertension 01/21/2007    PCP: Cindy Clotilda HERO, DO   REFERRING PROVIDER: Arnaldo Juliene RAMAN, MD  REFERRING DIAG: M54.2 (ICD-10-CM) - Cervicalgia; L adhesive capsulitis   THERAPY DIAG:  Left shoulder pain, unspecified chronicity  Stiffness of left shoulder, not elsewhere classified  Cervicalgia  Other symptoms and signs involving the musculoskeletal system  Muscle weakness (generalized)  Rationale for Evaluation and Treatment: Rehabilitation  ONSET DATE: 6-7 Months  SUBJECTIVE:  SUBJECTIVE STATEMENT: Pt reports shoulder is making a lot of progress. The deeper pain is subsiding and way less pronounced. Some residual symptoms in pec region. Doing HEP. Patient states 60% improvement/functional status.  Eval: Patient states L adhesive capsulitis with shoulder pain and some pain up into neck. Patient states shoulder is moving better. 6-7 months ago was holding grandchild who thrust backward and it really irritated shoulder. Pain is not as intense as it was. Doing stretches provided by MD. Alvis like she needs to build strength in the upper body. Worked on a computer for many years.   PERTINENT HISTORY:  Hx back pain/arthritis, HLD, HTN, pre-diabetes  PAIN:  Are you having pain? No at rest; 3/10 with stretches; worst 6/10 at night with turning in wrong position 11/21/23: worst 4/10   PRECAUTIONS: None  WEIGHT BEARING RESTRICTIONS: No  FALLS:  Has patient fallen in last 6 months? No  PLOF: Independent  PATIENT GOALS: improve motion and reduction of pain and stiffness in neck and shoulder  OBJECTIVE: (objective measures from  initial evaluation unless otherwise dated)  PATIENT SURVEYS:  UEFS  Extreme difficulty/unable (0), Quite a bit of difficulty (1), Moderate difficulty (2), Little difficulty (3), No difficulty (4) Survey date:  09/12/23 11/21/23  Any of your usual work, household or school activities 3 3  2. Your usual hobbies, recreational/sport activities 3 3   3. Lifting a bag of groceries to waist level 3 4   4. Lifting a bag of groceries above your head 3 4  5. Grooming your hair 3 3  6. Pushing up on your hands (I.e. from bathtub or chair) 4 4  7. Preparing food (I.e. peeling/cutting) 3 4  8. Driving  3 4  9. Vacuuming, sweeping, or raking 3 3  10. Dressing  3 4  11. Doing up buttons 4 4  12. Using tools/appliances 4 4  13. Opening doors 3 4  14. Cleaning  3 4  15. Tying or lacing shoes 3 4  16. Sleeping  2 3  17. Laundering clothes (I.e. washing, ironing, folding) 3 4  18. Opening a jar 3 3  19. Throwing a ball 2 4  20. Carrying a small suitcase with your affected limb.  4 4  Score total:  64/80 74/80     COGNITION: Overall cognitive status: Within functional limits for tasks assessed  SENSATION: WFL  POSTURE: rounded shoulders, forward head, and increased thoracic kyphosis  PALPATION: TTP L pec, UT, LS   CERVICAL ROM:   Active ROM A/PROM (deg) eval AROM 12/09/23  Flexion 35 40  Extension 40 40  Right lateral flexion 8 11  Left lateral flexion 12 13  Right rotation 45 57  Left rotation 48 56   (Blank rows = not tested) *=pain/symptoms  UPPER EXTREMITY ROM: PROM flexion 130 on L  Active ROM Right eval Left eval L 7/17 Right 11/21/23 Left 11/21/23  Shoulder flexion 155 120* 150 active assist with cane 162 142  Shoulder extension       Shoulder abduction 165 123* favors flexion  157 137 * favors flexion  Shoulder adduction       Shoulder extension       Shoulder internal rotation T7 L3*  T7 T10*  Shoulder external rotation T5 T3*  T5 T4  Elbow flexion       Elbow  extension       Wrist flexion       Wrist extension  Wrist ulnar deviation       Wrist radial deviation       Wrist pronation       Wrist supination        (Blank rows = not tested) *=pain/symptoms  UPPER EXTREMITY MMT:  MMT Right eval Left eval Right 11/21/23 Left 11/21/23  Shoulder flexion 5 4-* 5 4  Shoulder extension      Shoulder abduction 4+ 4-* 4+ 4  Shoulder adduction      Shoulder extension      Shoulder internal rotation 5 5 5 5   Shoulder external rotation 4+ 4+ 4+ 4+  Middle trapezius      Lower trapezius      Elbow flexion 5 5 5 5   Elbow extension 5 5 5 5   Wrist flexion      Wrist extension      Wrist ulnar deviation      Wrist radial deviation      Wrist pronation      Wrist supination      Grip strength       (Blank rows = not tested) *=pain/symptoms    TODAY'S TREATMENT:                                                                                                                              DATE:  11/21/23 Reassessment Discussion of findings and POC Pulleys flexion, scaption Standing shoulder flexion 2# 2 x 10 Standing row GTB 2 x 10 Standing shoulder extension GTB 2 x 10  7/23 STM to L UT, pecs, deltoid PROM L shoulder GHJ distraction  GHJ mobilizations inferior and posterior glides grade II-III Doorway pec stretch (low) (single arm) Supine cane flexion x10 with 5 2# bar hold at end range Supine active shoulder flexion x10 Seated press out 2x10 OH press 2x10 Posterior capsule stretch (ER at doorway) Pulleys flexion, scaption   7/17 STM to L UT, pecs, deltoid PROM L shoulder GHJ distraction  Seated UT stretching bil LS stretching bil Doorway pec stretch (low) (single arm) Supine cane flexion x10 with 5 hold at end range Supine active shoulder flexion x10 Supine active shoulder abduction x5 (challenging)   7/9 STM to bil UT, pecs PROM L shoulder GHJ mobilizations inf and posterior glides grade  II-III Seated UT stretching bil LS stretching bil Doorway pec stretch (low) Supine cane flexion x10 with 5 hold at end range Instruction in theracane HEP update/review  09/12/23  Eval, education, HEP Pulley flexion 4 minutes  PATIENT EDUCATION:  Education details: Patient educated on exam findings, POC, scope of PT, HEP, shoulder/cervical spine pathology, and relevant anatomy and biomechanics. 11/21/23: HEP, reassessment findings, POC Person educated: Patient Education method: Explanation, Demonstration, and Handouts Education comprehension: verbalized understanding, returned demonstration, verbal cues required, and tactile cues required  HOME EXERCISE PROGRAM: Access Code: [email or web address] URL: [email or web address] Date: 09/12/2023 Prepared by: Prentice Stains  Exercises - Shoulder Flexion Wall Slide with Towel (  Mirrored)  - 2-3 x daily - 7 x weekly - 2 sets - 10 reps - Supine Shoulder Flexion Extension AAROM with Dowel  - 2-3 x daily - 7 x weekly - 2 sets - 10 reps - Supine Shoulder External Rotation with Dowel  - 2-3 x daily - 7 x weekly - 2 sets - 10 reps - Seated Scapular Retraction  - 2-3 x daily - 7 x weekly - 2 sets - 10 reps  ASSESSMENT:  CLINICAL IMPRESSION: Patient has met 2/2 short term goals and 1/5 long term goals with ability to complete HEP and improvement in symptoms and activity tolerance. Remaining goals not met due to continued deficits in symptoms, strength, ROM, activity tolerance, and functional mobility although she has made good progress toward remaining goals in all aspects. Discussed findings and POC. Extending POC 1-2x/week for 8 more weeks to work toward remaining goals. Continued with shoulder mobility and strengthening exercises. Patient will continue to benefit from skilled physical therapy in order to improve function and reduce impairment.     OBJECTIVE IMPAIRMENTS: decreased activity tolerance, decreased endurance, decreased mobility,  decreased ROM, decreased strength, hypomobility, increased fascial restrictions, impaired flexibility, improper body mechanics, postural dysfunction, and pain.   ACTIVITY LIMITATIONS: carrying, lifting, bending, sleeping, bathing, dressing, reach over head, hygiene/grooming, and caring for others  PARTICIPATION LIMITATIONS: meal prep, cleaning, laundry, shopping, community activity, and yard work  PERSONAL FACTORS: Fitness, Time since onset of injury/illness/exacerbation, and 1-2 comorbidities: Hx back pain/arthritis, HLD, HTN, pre-diabetes are also affecting patient's functional outcome.   REHAB POTENTIAL: Good  CLINICAL DECISION MAKING: Evolving/moderate complexity  EVALUATION COMPLEXITY: Moderate   GOALS: Goals reviewed with patient? Yes  SHORT TERM GOALS: Target date: 10/10/2023    Patient will be independent with HEP in order to improve functional outcomes. Baseline: Goal status: MET 7/17  2.  Patient will report at least 25% improvement in symptoms for improved quality of life. Baseline:  Goal status: MET 7/17    LONG TERM GOALS: Target date: 11/07/2023    Patient will report at least 75% improvement in symptoms for improved quality of life. Baseline:  Goal status: INITIAL  2.  Patient will improve UEFS score by at least 9 points in order to indicate improved tolerance to activity. Baseline:  Goal status: MET  3.  Patient will demonstrate at least 10d improvement in cervical ROM in all restricted planes for improved ability to move head while driving. Baseline:  Goal status: INITIAL  4.  Patient will demonstrate at least 150 degrees of shoulder flexion for improved ability to reach overhead.   Baseline:  Goal status: INITIAL  5.  Patient will demonstrate grade of 5/5 MMT grade in all tested musculature as evidence of improved strength to assist with lifting at home. Baseline:  Goal status: INITIAL       PLAN:  PT FREQUENCY: 1-2x/week  PT DURATION: 8  weeks  PLANNED INTERVENTIONS: 97164- PT Re-evaluation, 97110-Therapeutic exercises, 97530- Therapeutic activity, [id number]- Neuromuscular re-education, 97535- Self Care, [id number]- Manual therapy, [id number]- Gait training, [phone]- Orthotic Fit/training, [phone]- Canalith repositioning, [id number]- Aquatic Therapy, [phone]- Splinting, [phone]- Wound care (first 20 sq cm), 97598- Wound care (each additional 20 sq cm)Patient/Family education, Balance training, Stair training, Taping, Dry Needling, Joint mobilization, Joint manipulation, Spinal manipulation, Spinal mobilization, Scar mobilization, and DME instructions.  PLAN FOR NEXT SESSION: manual for pain /mobility to shoulder, pec, c/sp; L shoulder mobility and strengthening, cervical mobility, postural strength, thoracic mobility   Prentice GORMAN Stains, PT, DPT 11/21/2023,  10:56 AM   Date of referral: 08/20/23 Referring provider: Arnaldo Juliene RAMAN, MD Referring diagnosis? M54.2 (ICD-10-CM) - Cervicalgia; L adhesive capsulitis  Treatment diagnosis? (if different than referring diagnosis) M25.512   What was this (referring dx) caused by? Ongoing Issue  Lysle of Condition: Chronic (continuous duration > 3 months)   Laterality: Lt  Current Functional Measure Score: Other UEFS 64/80  Objective measurements identify impairments when they are compared to normal values, the uninvolved extremity, and prior level of function.  [x]  Yes  []  No  Objective assessment of functional ability: Moderate functional limitations   Briefly describe symptoms: neck pain/stiffness, L shoulder pain weakness, stiffness  How did symptoms start: insidous onset of adhesive capsulitis 6-7 months ago  Average pain intensity:  Last 24 hours: 0-6/10  Past week: 0-6/10  How often does the pt experience symptoms? Frequently  How much have the symptoms interfered with usual daily activities? Moderately  How has condition changed since care began at this facility? NA - initial visit  In  general, how is the patients overall health? Good   BACK PAIN (STarT Back Screening Tool) No

## 2023-12-05 ENCOUNTER — Encounter (HOSPITAL_BASED_OUTPATIENT_CLINIC_OR_DEPARTMENT_OTHER): Admitting: Physical Therapy

## 2023-12-12 ENCOUNTER — Encounter (HOSPITAL_BASED_OUTPATIENT_CLINIC_OR_DEPARTMENT_OTHER): Payer: Self-pay | Admitting: Physical Therapy

## 2023-12-12 ENCOUNTER — Ambulatory Visit (HOSPITAL_BASED_OUTPATIENT_CLINIC_OR_DEPARTMENT_OTHER): Attending: Sports Medicine | Admitting: Physical Therapy

## 2023-12-12 DIAGNOSIS — M25512 Pain in left shoulder: Secondary | ICD-10-CM | POA: Diagnosis not present

## 2023-12-12 DIAGNOSIS — M542 Cervicalgia: Secondary | ICD-10-CM | POA: Diagnosis not present

## 2023-12-12 DIAGNOSIS — M6281 Muscle weakness (generalized): Secondary | ICD-10-CM | POA: Insufficient documentation

## 2023-12-12 DIAGNOSIS — R29898 Other symptoms and signs involving the musculoskeletal system: Secondary | ICD-10-CM | POA: Diagnosis not present

## 2023-12-12 DIAGNOSIS — M25612 Stiffness of left shoulder, not elsewhere classified: Secondary | ICD-10-CM | POA: Diagnosis not present

## 2023-12-12 NOTE — Therapy (Signed)
 OUTPATIENT PHYSICAL THERAPY TREATMENT   Patient Name: Sherry Chapman MRN: [id number] DOB:[birth year], 68 y.o., female Today's Date: 12/12/2023 END OF SESSION:  PT End of Session - 12/12/23 1108     Visit Number 6    Number of Visits 24    Date for PT Re-Evaluation 01/16/24    Authorization Type [employer] MEDICARE    Authorization Time Period 10/17/23-12/12/23    Authorization - Visit Number 6    Authorization - Number of Visits 12    Progress Note Due on Visit 15    PT Start Time 1105    PT Stop Time 1145    PT Time Calculation (min) 40 min    Activity Tolerance Patient tolerated treatment well    Behavior During Therapy WFL for tasks assessed/performed             Past Medical History:  Diagnosis Date   Allergy    Anemia    past hx    Anxiety    Arthritis    neck and upper back    Back pain    Benign neoplasm of adrenal gland    Depression    Diverticulosis    01-15-2008 colon and 01/2018 colon   GERD (gastroesophageal reflux disease)    Hematuria, unspecified    Hx of colonic polyps 2019   Hyperlipidemia    Hypertension    Lactose intolerance    uses lactaid prn    Neuromuscular disorder (HCC)    RLS    Restless leg syndrome    Ulnar abutment syndrome    bilaterally    Vertigo    Past Surgical History:  Procedure Laterality Date   COLONOSCOPY  03/01/2018   per Dr. Leigh, sessile serrated polyp, repeat in 5 yrs    ganglion cyst removed Left    06-05-2008 Dr Juli- left wrist    NASAL SINUS SURGERY     nasal reconstruction   toe surgery     Patient Active Problem List   Diagnosis Date Noted   Environmental and seasonal allergies 07/01/2021   Depression with anxiety 06/02/2019   Cough 06/20/2011   Benign neoplasm of adrenal gland 12/20/2009   BACK PAIN, LUMBAR 07/21/2009   HEMATURIA UNSPECIFIED 07/16/2009   RESTLESS LEG SYNDROME 05/01/2008   LEG CRAMPS, NOCTURNAL 02/11/2008   HYPERLIPIDEMIA 12/03/2007   GERD 12/03/2007   VERTIGO 11/21/2007    Essential hypertension 01/21/2007    PCP: Cindy Clotilda HERO, DO   REFERRING PROVIDER: Arnaldo Juliene RAMAN, MD  REFERRING DIAG: M54.2 (ICD-10-CM) - Cervicalgia; L adhesive capsulitis   THERAPY DIAG:  Left shoulder pain, unspecified chronicity  Stiffness of left shoulder, not elsewhere classified  Cervicalgia  Muscle weakness (generalized)  Rationale for Evaluation and Treatment: Rehabilitation  ONSET DATE: 6-7 Months  SUBJECTIVE:  SUBJECTIVE STATEMENT: Pt reports she continues to see gradual improvement each day.  Over this past week or two I can feel a little more range of motion in my neck.  Patient states 70% improvement/functional status.  Pt reports compliance with HEP.  She reports continued difficulty reaching Lt hand to Rt shoulder (crossing chest).   Eval: Patient states L adhesive capsulitis with shoulder pain and some pain up into neck. Patient states shoulder is moving better. 6-7 months ago was holding grandchild who thrust backward and it really irritated shoulder. Pain is not as intense as it was. Doing stretches provided by MD. Alvis like she needs to build strength in the upper body. Worked on a computer for many years.   PERTINENT HISTORY:  Hx back pain/arthritis, HLD, HTN, pre-diabetes  PAIN:  Are you having pain? No at rest; 3/10 with stretches;  Location: neck and Lt shoulder Description: deep ache   PRECAUTIONS: None  WEIGHT BEARING RESTRICTIONS: No  FALLS:  Has patient fallen in last 6 months? No  PLOF: Independent  PATIENT GOALS: improve motion and reduction of pain and stiffness in neck and shoulder  OBJECTIVE: (objective measures from initial evaluation unless otherwise dated)  PATIENT SURVEYS:  UEFS  Extreme difficulty/unable (0), Quite a bit  of difficulty (1), Moderate difficulty (2), Little difficulty (3), No difficulty (4) Survey date:  09/12/23 11/21/23  Any of your usual work, household or school activities 3 3  2. Your usual hobbies, recreational/sport activities 3 3   3. Lifting a bag of groceries to waist level 3 4   4. Lifting a bag of groceries above your head 3 4  5. Grooming your hair 3 3  6. Pushing up on your hands (I.e. from bathtub or chair) 4 4  7. Preparing food (I.e. peeling/cutting) 3 4  8. Driving  3 4  9. Vacuuming, sweeping, or raking 3 3  10. Dressing  3 4  11. Doing up buttons 4 4  12. Using tools/appliances 4 4  13. Opening doors 3 4  14. Cleaning  3 4  15. Tying or lacing shoes 3 4  16. Sleeping  2 3  17. Laundering clothes (I.e. washing, ironing, folding) 3 4  18. Opening a jar 3 3  19. Throwing a ball 2 4  20. Carrying a small suitcase with your affected limb.  4 4  Score total:  64/80 74/80     COGNITION: Overall cognitive status: Within functional limits for tasks assessed  SENSATION: WFL  POSTURE: rounded shoulders, forward head, and increased thoracic kyphosis  PALPATION: TTP L pec, UT, LS   CERVICAL ROM:   Active ROM A/PROM (deg) eval AROM 12/09/23 AROM 12/12/23  Flexion 35 40   Extension 40 40   Right lateral flexion 8 11 15   Left lateral flexion 12 13 23   Right rotation 45 57   Left rotation 48 56    (Blank rows = not tested) *=pain/symptoms  UPPER EXTREMITY ROM: PROM flexion 130 on L  Active ROM Right eval Left eval L 7/17 Right 11/21/23 Left 11/21/23 Left 12/11/23  Shoulder flexion 155 120* 150 active assist with cane 162 142 142  Shoulder extension        Shoulder abduction 165 123* favors flexion  157 137 * favors flexion   Shoulder adduction        Shoulder extension        Shoulder internal rotation T7 L3*  T7 T10* T10  Shoulder external rotation T5 T3*  T5 T4 47 (supine, shoulder abdct 90)  Elbow flexion        Elbow extension        Wrist flexion         Wrist extension        Wrist ulnar deviation        Wrist radial deviation        Wrist pronation        Wrist supination         (Blank rows = not tested) *=pain/symptoms  UPPER EXTREMITY MMT:  MMT Right eval Left eval Right 11/21/23 Left 11/21/23  Shoulder flexion 5 4-* 5 4  Shoulder extension      Shoulder abduction 4+ 4-* 4+ 4  Shoulder adduction      Shoulder extension      Shoulder internal rotation 5 5 5 5   Shoulder external rotation 4+ 4+ 4+ 4+  Middle trapezius      Lower trapezius      Elbow flexion 5 5 5 5   Elbow extension 5 5 5 5   Wrist flexion      Wrist extension      Wrist ulnar deviation      Wrist radial deviation      Wrist pronation      Wrist supination      Grip strength       (Blank rows = not tested) *=pain/symptoms    TODAY'S TREATMENT:                                                                                                                              DATE:  12/12/23 -UBE L1: 30 sec forward/ 30 sec backward, repeated for 2.5 min -Mid level doorway stretch for LUE x 15sec x 3 - cues for form -Standing Lt shoulder flexion, sliding arm up door frame x 5 -> moved to bil sliding UE up cabinets x5 -Supine Lt shoulder AAROM abdct with cane (on diagonal) x 15 -Supine Lt shoulder AAROM ER with cane,with arm resting in scaption on towel x 15 -star gazer stretch for ER shoulder ROM - supine bil shoulder ER with yellow band 2 x 10 - supine D2 flexion RUE x 10 with yellow band - seated lateral flexion neck stretch x 15s x 2 each side  11/21/23 Reassessment Discussion of findings and POC Pulleys flexion, scaption Standing shoulder flexion 2# 2 x 10 Standing row GTB 2 x 10 Standing shoulder extension GTB 2 x 10  7/23 STM to L UT, pecs, deltoid PROM L shoulder GHJ distraction  GHJ mobilizations inferior and posterior glides grade II-III Doorway pec stretch (low) (single arm) Supine cane flexion x10 with 5 2# bar hold at end  range Supine active shoulder flexion x10 Seated press out 2x10 OH press 2x10 Posterior capsule stretch (ER at doorway) Pulleys flexion, scaption   7/17 STM to L UT, pecs, deltoid PROM L shoulder GHJ distraction  Seated  UT stretching bil LS stretching bil Doorway pec stretch (low) (single arm) Supine cane flexion x10 with 5 hold at end range Supine active shoulder flexion x10 Supine active shoulder abduction x5 (challenging)   7/9 STM to bil UT, pecs PROM L shoulder GHJ mobilizations inf and posterior glides grade II-III Seated UT stretching bil LS stretching bil Doorway pec stretch (low) Supine cane flexion x10 with 5 hold at end range Instruction in theracane HEP update/review  09/12/23  Eval, education, HEP Pulley flexion 4 minutes  PATIENT EDUCATION:  Education details: Patient educated on  findings, HEP,  Person educated: Patient Education method: Programmer, multimedia, Facilities manager, and Handouts Education comprehension: verbalized understanding, returned demonstration, verbal cues required, and tactile cues required  HOME EXERCISE PROGRAM: Access Code: [email or web address] URL: [email or web address] Updated 12/12/23  ASSESSMENT:  CLINICAL IMPRESSION: Pt tolerated exercises well, reporting mild discomfort during ROM stretches. Pt continues to have limitations with cervical ROM and Lt shoulder ER, flexion and abdct ROM.  Continued with shoulder mobility and strengthening exercises; updated HEP.Pt has partially met her goals and will continue to benefit from skilled physical therapy in order to improve function and reduce impairment.     OBJECTIVE IMPAIRMENTS: decreased activity tolerance, decreased endurance, decreased mobility, decreased ROM, decreased strength, hypomobility, increased fascial restrictions, impaired flexibility, improper body mechanics, postural dysfunction, and pain.   ACTIVITY LIMITATIONS: carrying, lifting, bending, sleeping, bathing,  dressing, reach over head, hygiene/grooming, and caring for others  PARTICIPATION LIMITATIONS: meal prep, cleaning, laundry, shopping, community activity, and yard work  PERSONAL FACTORS: Fitness, Time since onset of injury/illness/exacerbation, and 1-2 comorbidities: Hx back pain/arthritis, HLD, HTN, pre-diabetes are also affecting patient's functional outcome.   REHAB POTENTIAL: Good  CLINICAL DECISION MAKING: Evolving/moderate complexity  EVALUATION COMPLEXITY: Moderate   GOALS: Goals reviewed with patient? Yes  SHORT TERM GOALS: Target date: 10/10/2023    Patient will be independent with HEP in order to improve functional outcomes. Baseline: Goal status: MET 7/17  2.  Patient will report at least 25% improvement in symptoms for improved quality of life. Baseline:  Goal status: MET 7/17    LONG TERM GOALS: Target date: 11/07/2023    Patient will report at least 75% improvement in symptoms for improved quality of life. Baseline: see above Goal status:IN PROGRESS - 12/12/23  2.  Patient will improve UEFS score by at least 9 points in order to indicate improved tolerance to activity. Baseline:  Goal status: MET -11/21/23  3.  Patient will demonstrate at least 10d improvement in cervical ROM in all restricted planes for improved ability to move head while driving. Baseline: see above  Goal status: Partially met 12/12/23  4.  Patient will demonstrate at least 150 degrees of shoulder flexion for improved ability to reach overhead.   Baseline: see above Goal status:IN PROGRESS 12/12/23  5.  Patient will demonstrate grade of 5/5 MMT grade in all tested musculature as evidence of improved strength to assist with lifting at home. Baseline: see above Goal status: IN PROGRESS -11/21/23       PLAN:  PT FREQUENCY: 1-2x/week  PT DURATION: 8 weeks  PLANNED INTERVENTIONS: 97164- PT Re-evaluation, 97110-Therapeutic exercises, 97530- Therapeutic activity, 97112- Neuromuscular  re-education, 97535- Self Care, 02859- Manual therapy, [phone]- Gait training, [phone]- Orthotic Fit/training, [phone]- Canalith repositioning, [id number]- Aquatic Therapy, [phone]- Splinting, [phone]- Wound care (first 20 sq cm), 97598- Wound care (each additional 20 sq cm)Patient/Family education, Balance training, Stair training, Taping, Dry Needling, Joint mobilization, Joint manipulation, Spinal manipulation, Spinal mobilization, Scar mobilization, and DME instructions.  PLAN FOR NEXT SESSION: manual for pain /mobility to shoulder, pec, c/sp; L shoulder mobility and strengthening, cervical mobility, postural strength, thoracic mobility   Delon Aquas, PTA 12/12/23 1:02 PM [facility] [address] [place], [place], [address] Phone: [phone]   Fax:  [phone]   Date of referral: 08/20/23 Referring provider: Arnaldo Juliene RAMAN, MD Referring diagnosis? M54.2 (ICD-10-CM) - Cervicalgia; L adhesive capsulitis  Treatment diagnosis? (if different than referring diagnosis) M25.512   What was this (referring dx) caused by? Ongoing Issue  Lysle of Condition: Chronic (continuous duration > 3 months)   Laterality: Lt  Current Functional Measure Score: Other UEFS 64/80  Objective measurements identify impairments when they are compared to normal values, the uninvolved extremity, and prior level of function.  [x]  Yes  []  No  Objective assessment of functional ability: Moderate functional limitations   Briefly describe symptoms: neck pain/stiffness, L shoulder pain weakness, stiffness  How did symptoms start: insidous onset of adhesive capsulitis 6-7 months ago  Average pain intensity:  Last 24 hours: 0-6/10  Past week: 0-6/10  How often does the pt experience symptoms? Frequently  How much have the symptoms interfered with usual daily activities? Moderately  How has condition changed since care began at this facility? NA - initial  visit  In general, how is the patients overall health? Good   BACK PAIN (STarT Back Screening Tool) No

## 2023-12-13 DIAGNOSIS — I10 Essential (primary) hypertension: Secondary | ICD-10-CM | POA: Diagnosis not present

## 2023-12-13 DIAGNOSIS — Z23 Encounter for immunization: Secondary | ICD-10-CM | POA: Diagnosis not present

## 2023-12-13 DIAGNOSIS — R7303 Prediabetes: Secondary | ICD-10-CM | POA: Diagnosis not present

## 2023-12-13 DIAGNOSIS — J45909 Unspecified asthma, uncomplicated: Secondary | ICD-10-CM | POA: Diagnosis not present

## 2023-12-19 ENCOUNTER — Encounter (HOSPITAL_BASED_OUTPATIENT_CLINIC_OR_DEPARTMENT_OTHER): Payer: Self-pay

## 2023-12-19 ENCOUNTER — Ambulatory Visit (HOSPITAL_BASED_OUTPATIENT_CLINIC_OR_DEPARTMENT_OTHER)

## 2023-12-19 DIAGNOSIS — R29898 Other symptoms and signs involving the musculoskeletal system: Secondary | ICD-10-CM

## 2023-12-19 DIAGNOSIS — M25512 Pain in left shoulder: Secondary | ICD-10-CM

## 2023-12-19 DIAGNOSIS — M25612 Stiffness of left shoulder, not elsewhere classified: Secondary | ICD-10-CM

## 2023-12-19 DIAGNOSIS — M542 Cervicalgia: Secondary | ICD-10-CM | POA: Diagnosis not present

## 2023-12-19 DIAGNOSIS — M6281 Muscle weakness (generalized): Secondary | ICD-10-CM

## 2023-12-19 NOTE — Therapy (Signed)
 OUTPATIENT PHYSICAL THERAPY TREATMENT   Patient Name: Sherry Chapman MRN: [id number] DOB:1955/06/05, 68 y.o., female Today's Date: 12/19/2023 END OF SESSION:  PT End of Session - 12/19/23 1024     Visit Number 7    Number of Visits 24    Date for PT Re-Evaluation 01/16/24    Authorization Type [employer] MEDICARE    Authorization Time Period 12/19/2023-01/16/2024    Authorization - Visit Number 1    Authorization - Number of Visits 4    PT Start Time 1019    PT Stop Time 1100    PT Time Calculation (min) 41 min    Activity Tolerance Patient tolerated treatment well    Behavior During Therapy WFL for tasks assessed/performed              Past Medical History:  Diagnosis Date   Allergy    Anemia    past hx    Anxiety    Arthritis    neck and upper back    Back pain    Benign neoplasm of adrenal gland    Depression    Diverticulosis    01-15-2008 colon and 01/2018 colon   GERD (gastroesophageal reflux disease)    Hematuria, unspecified    Hx of colonic polyps 2019   Hyperlipidemia    Hypertension    Lactose intolerance    uses lactaid prn    Neuromuscular disorder (HCC)    RLS    Restless leg syndrome    Ulnar abutment syndrome    bilaterally    Vertigo    Past Surgical History:  Procedure Laterality Date   COLONOSCOPY  03/01/2018   per Dr. Leigh, sessile serrated polyp, repeat in 5 yrs    ganglion cyst removed Left    06-05-2008 Dr Juli- left wrist    NASAL SINUS SURGERY     nasal reconstruction   toe surgery     Patient Active Problem List   Diagnosis Date Noted   Environmental and seasonal allergies 07/01/2021   Depression with anxiety 06/02/2019   Cough 06/20/2011   Benign neoplasm of adrenal gland 12/20/2009   BACK PAIN, LUMBAR 07/21/2009   HEMATURIA UNSPECIFIED 07/16/2009   RESTLESS LEG SYNDROME 05/01/2008   LEG CRAMPS, NOCTURNAL 02/11/2008   HYPERLIPIDEMIA 12/03/2007   GERD 12/03/2007   VERTIGO 11/21/2007   Essential hypertension  01/21/2007    PCP: Cindy Clotilda HERO, DO   REFERRING PROVIDER: Arnaldo Juliene RAMAN, MD  REFERRING DIAG: M54.2 (ICD-10-CM) - Cervicalgia; L adhesive capsulitis   THERAPY DIAG:  Left shoulder pain, unspecified chronicity  Stiffness of left shoulder, not elsewhere classified  Cervicalgia  Muscle weakness (generalized)  Other symptoms and signs involving the musculoskeletal system  Rationale for Evaluation and Treatment: Rehabilitation  ONSET DATE: 6-7 Months  SUBJECTIVE:  SUBJECTIVE STATEMENT: Pt reports she is noticing more and more improvements in use of L UE. C/o pec tightness.   Eval: Patient states L adhesive capsulitis with shoulder pain and some pain up into neck. Patient states shoulder is moving better. 6-7 months ago was holding grandchild who thrust backward and it really irritated shoulder. Pain is not as intense as it was. Doing stretches provided by MD. Alvis like she needs to build strength in the upper body. Worked on a computer for many years.   PERTINENT HISTORY:  Hx back pain/arthritis, HLD, HTN, pre-diabetes  PAIN:  Are you having pain? No at rest; 3/10 with stretches;  Location: neck and Lt shoulder Description: deep ache   PRECAUTIONS: None  WEIGHT BEARING RESTRICTIONS: No  FALLS:  Has patient fallen in last 6 months? No  PLOF: Independent  PATIENT GOALS: improve motion and reduction of pain and stiffness in neck and shoulder  OBJECTIVE: (objective measures from initial evaluation unless otherwise dated)  PATIENT SURVEYS:  UEFS  Extreme difficulty/unable (0), Quite a bit of difficulty (1), Moderate difficulty (2), Little difficulty (3), No difficulty (4) Survey date:  09/12/23 11/21/23  Any of your usual work, household or school activities 3 3  2.  Your usual hobbies, recreational/sport activities 3 3   3. Lifting a bag of groceries to waist level 3 4   4. Lifting a bag of groceries above your head 3 4  5. Grooming your hair 3 3  6. Pushing up on your hands (I.e. from bathtub or chair) 4 4  7. Preparing food (I.e. peeling/cutting) 3 4  8. Driving  3 4  9. Vacuuming, sweeping, or raking 3 3  10. Dressing  3 4  11. Doing up buttons 4 4  12. Using tools/appliances 4 4  13. Opening doors 3 4  14. Cleaning  3 4  15. Tying or lacing shoes 3 4  16. Sleeping  2 3  17. Laundering clothes (I.e. washing, ironing, folding) 3 4  18. Opening a jar 3 3  19. Throwing a ball 2 4  20. Carrying a small suitcase with your affected limb.  4 4  Score total:  64/80 74/80     COGNITION: Overall cognitive status: Within functional limits for tasks assessed  SENSATION: WFL  POSTURE: rounded shoulders, forward head, and increased thoracic kyphosis  PALPATION: TTP L pec, UT, LS   CERVICAL ROM:   Active ROM A/PROM (deg) eval AROM 12/09/23 AROM 12/12/23  Flexion 35 40   Extension 40 40   Right lateral flexion 8 11 15   Left lateral flexion 12 13 23   Right rotation 45 57   Left rotation 48 56    (Blank rows = not tested) *=pain/symptoms  UPPER EXTREMITY ROM: PROM flexion 130 on L  Active ROM Right eval Left eval L 7/17 Right 11/21/23 Left 11/21/23 Left 12/11/23  Shoulder flexion 155 120* 150 active assist with cane 162 142 142  Shoulder extension        Shoulder abduction 165 123* favors flexion  157 137 * favors flexion   Shoulder adduction        Shoulder extension        Shoulder internal rotation T7 L3*  T7 T10* T10  Shoulder external rotation T5 T3*  T5 T4 47 (supine, shoulder abdct 90)  Elbow flexion        Elbow extension        Wrist flexion        Wrist  extension        Wrist ulnar deviation        Wrist radial deviation        Wrist pronation        Wrist supination         (Blank rows = not tested)  *=pain/symptoms  UPPER EXTREMITY MMT:  MMT Right eval Left eval Right 11/21/23 Left 11/21/23  Shoulder flexion 5 4-* 5 4  Shoulder extension      Shoulder abduction 4+ 4-* 4+ 4  Shoulder adduction      Shoulder extension      Shoulder internal rotation 5 5 5 5   Shoulder external rotation 4+ 4+ 4+ 4+  Middle trapezius      Lower trapezius      Elbow flexion 5 5 5 5   Elbow extension 5 5 5 5   Wrist flexion      Wrist extension      Wrist ulnar deviation      Wrist radial deviation      Wrist pronation      Wrist supination      Grip strength       (Blank rows = not tested) *=pain/symptoms    TODAY'S TREATMENT:                                                                                                                              DATE:   12/19/23 -Pec stretch at doorway 5x15seconds  -wall slide flexion with stretch at end range 5 x10 -Cabinet reach second shelf 2# DB 2x10 -OH press in standing 2# 2x10 -Standing press out 2# 2x10 -Ball rolls at wall 4way 2.2# ball  -wall push ups 2x10 -bil ER YTB 2x10 -Theraband row GTB 2x15 -cable column:  -press out single arm 10lb x10, x8  -Single arm row 10lb x10, x5  -Single arm bicep curl 5# x10   -UBE L1:  total, alternating fwd/back each minute   12/12/23 -UBE L1: 30 sec forward/ 30 sec backward, repeated for 2.5 min -Mid level doorway stretch for LUE x 15sec x 3 - cues for form -Standing Lt shoulder flexion, sliding arm up door frame x 5 -> moved to bil sliding UE up cabinets x5 -Supine Lt shoulder AAROM abdct with cane (on diagonal) x 15 -Supine Lt shoulder AAROM ER with cane,with arm resting in scaption on towel x 15 -star gazer stretch for ER shoulder ROM - supine bil shoulder ER with yellow band 2 x 10 - supine D2 flexion RUE x 10 with yellow band - seated lateral flexion neck stretch x 15s x 2 each side  11/21/23 Reassessment Discussion of findings and POC Pulleys flexion, scaption Standing  shoulder flexion 2# 2 x 10 Standing row GTB 2 x 10 Standing shoulder extension GTB 2 x 10   PATIENT EDUCATION:  Education details: Patient educated on  findings, HEP,  Person educated: Patient Education method: Programmer, multimedia, Demonstration, and  Handouts Education comprehension: verbalized understanding, returned demonstration, verbal cues required, and tactile cues required  HOME EXERCISE PROGRAM: Access Code: [email or web address] URL: [email or web address] Updated 12/12/23  ASSESSMENT:  CLINICAL IMPRESSION: Pt progressing well with progressions to functional activity and NMR. Pt limited in endurance so incorporated this into session. Pt c/o pec tightness and felt relief from doorway stretch. Worked on light cable strengthening with no c/o pain, only fatigue. Pt will continue to benefit from PT to progress NMC, shoulder/cervical ROM/ strength, and postural re-ed.     OBJECTIVE IMPAIRMENTS: decreased activity tolerance, decreased endurance, decreased mobility, decreased ROM, decreased strength, hypomobility, increased fascial restrictions, impaired flexibility, improper body mechanics, postural dysfunction, and pain.   ACTIVITY LIMITATIONS: carrying, lifting, bending, sleeping, bathing, dressing, reach over head, hygiene/grooming, and caring for others  PARTICIPATION LIMITATIONS: meal prep, cleaning, laundry, shopping, community activity, and yard work  PERSONAL FACTORS: Fitness, Time since onset of injury/illness/exacerbation, and 1-2 comorbidities: Hx back pain/arthritis, HLD, HTN, pre-diabetes are also affecting patient's functional outcome.   REHAB POTENTIAL: Good  CLINICAL DECISION MAKING: Evolving/moderate complexity  EVALUATION COMPLEXITY: Moderate   GOALS: Goals reviewed with patient? Yes  SHORT TERM GOALS: Target date: 10/10/2023    Patient will be independent with HEP in order to improve functional outcomes. Baseline: Goal status: MET 7/17  2.  Patient will report  at least 25% improvement in symptoms for improved quality of life. Baseline:  Goal status: MET 7/17    LONG TERM GOALS: Target date: 11/07/2023    Patient will report at least 75% improvement in symptoms for improved quality of life. Baseline: see above Goal status:IN PROGRESS - 12/12/23  2.  Patient will improve UEFS score by at least 9 points in order to indicate improved tolerance to activity. Baseline:  Goal status: MET -11/21/23  3.  Patient will demonstrate at least 10d improvement in cervical ROM in all restricted planes for improved ability to move head while driving. Baseline: see above  Goal status: Partially met 12/12/23  4.  Patient will demonstrate at least 150 degrees of shoulder flexion for improved ability to reach overhead.   Baseline: see above Goal status:IN PROGRESS 12/12/23  5.  Patient will demonstrate grade of 5/5 MMT grade in all tested musculature as evidence of improved strength to assist with lifting at home. Baseline: see above Goal status: IN PROGRESS -11/21/23       PLAN:  PT FREQUENCY: 1-2x/week  PT DURATION: 8 weeks  PLANNED INTERVENTIONS: 97164- PT Re-evaluation, 97110-Therapeutic exercises, 97530- Therapeutic activity, 97112- Neuromuscular re-education, 97535- Self Care, 02859- Manual therapy, [phone]- Gait training, [phone]- Orthotic Fit/training, [phone]- Canalith repositioning, [id number]- Aquatic Therapy, [phone]- Splinting, [phone]- Wound care (first 20 sq cm), 97598- Wound care (each additional 20 sq cm)Patient/Family education, Balance training, Stair training, Taping, Dry Needling, Joint mobilization, Joint manipulation, Spinal manipulation, Spinal mobilization, Scar mobilization, and DME instructions.  PLAN FOR NEXT SESSION: manual for pain /mobility to shoulder, pec, c/sp; L shoulder mobility and strengthening, cervical mobility, postural strength, thoracic mobility   Asberry Rodes, PTA  12/19/23 12:02 PM [facility] [address] [place], [place], [address] Phone: [phone]   Fax:  [phone]   Date of referral: 08/20/23 Referring provider: Arnaldo Juliene RAMAN, MD Referring diagnosis? M54.2 (ICD-10-CM) - Cervicalgia; L adhesive capsulitis  Treatment diagnosis? (if different than referring diagnosis) M25.512   What was this (referring dx) caused by? Ongoing Issue  Lysle of Condition: Chronic (continuous duration > 3 months)   Laterality: Lt  Current Functional  Measure Score: Other UEFS 64/80  Objective measurements identify impairments when they are compared to normal values, the uninvolved extremity, and prior level of function.  [x]  Yes  []  No  Objective assessment of functional ability: Moderate functional limitations   Briefly describe symptoms: neck pain/stiffness, L shoulder pain weakness, stiffness  How did symptoms start: insidous onset of adhesive capsulitis 6-7 months ago  Average pain intensity:  Last 24 hours: 0-6/10  Past week: 0-6/10  How often does the pt experience symptoms? Frequently  How much have the symptoms interfered with usual daily activities? Moderately  How has condition changed since care began at this facility? NA - initial visit  In general, how is the patients overall health? Good   BACK PAIN (STarT Back Screening Tool) No

## 2023-12-21 DIAGNOSIS — H53143 Visual discomfort, bilateral: Secondary | ICD-10-CM | POA: Diagnosis not present

## 2023-12-21 DIAGNOSIS — H2513 Age-related nuclear cataract, bilateral: Secondary | ICD-10-CM | POA: Diagnosis not present

## 2023-12-21 DIAGNOSIS — H5213 Myopia, bilateral: Secondary | ICD-10-CM | POA: Diagnosis not present

## 2023-12-21 DIAGNOSIS — H04123 Dry eye syndrome of bilateral lacrimal glands: Secondary | ICD-10-CM | POA: Diagnosis not present

## 2023-12-21 DIAGNOSIS — H43393 Other vitreous opacities, bilateral: Secondary | ICD-10-CM | POA: Diagnosis not present

## 2023-12-28 ENCOUNTER — Encounter (HOSPITAL_BASED_OUTPATIENT_CLINIC_OR_DEPARTMENT_OTHER)

## 2024-01-10 ENCOUNTER — Encounter (HOSPITAL_BASED_OUTPATIENT_CLINIC_OR_DEPARTMENT_OTHER): Admitting: Physical Therapy

## 2024-01-17 ENCOUNTER — Ambulatory Visit (HOSPITAL_BASED_OUTPATIENT_CLINIC_OR_DEPARTMENT_OTHER): Admitting: Physical Therapy

## 2024-01-29 DIAGNOSIS — K219 Gastro-esophageal reflux disease without esophagitis: Secondary | ICD-10-CM | POA: Diagnosis not present

## 2024-01-29 DIAGNOSIS — R110 Nausea: Secondary | ICD-10-CM | POA: Diagnosis not present

## 2024-01-29 DIAGNOSIS — R1013 Epigastric pain: Secondary | ICD-10-CM | POA: Diagnosis not present
